# Patient Record
Sex: Female | Born: 1955
Health system: Southern US, Community
[De-identification: ages and names within clinical notes are randomized; demographics above are authoritative.]

## PROBLEM LIST (undated history)

## (undated) DIAGNOSIS — E559 Vitamin D deficiency, unspecified: Secondary | ICD-10-CM

## (undated) DIAGNOSIS — M75112 Incomplete rotator cuff tear or rupture of left shoulder, not specified as traumatic: Secondary | ICD-10-CM

## (undated) DIAGNOSIS — K449 Diaphragmatic hernia without obstruction or gangrene: Secondary | ICD-10-CM

## (undated) DIAGNOSIS — T7840XA Allergy, unspecified, initial encounter: Secondary | ICD-10-CM

## (undated) DIAGNOSIS — R030 Elevated blood-pressure reading, without diagnosis of hypertension: Secondary | ICD-10-CM

## (undated) DIAGNOSIS — G629 Polyneuropathy, unspecified: Principal | ICD-10-CM

## (undated) DIAGNOSIS — Z973 Presence of spectacles and contact lenses: Secondary | ICD-10-CM

## (undated) DIAGNOSIS — Z972 Presence of dental prosthetic device (complete) (partial): Secondary | ICD-10-CM

## (undated) DIAGNOSIS — H409 Unspecified glaucoma: Secondary | ICD-10-CM

## (undated) DIAGNOSIS — L932 Other local lupus erythematosus: Secondary | ICD-10-CM

## (undated) DIAGNOSIS — M6530 Trigger finger, unspecified finger: Secondary | ICD-10-CM

## (undated) DIAGNOSIS — K08109 Complete loss of teeth, unspecified cause, unspecified class: Secondary | ICD-10-CM

## (undated) DIAGNOSIS — J309 Allergic rhinitis, unspecified: Secondary | ICD-10-CM

## (undated) DIAGNOSIS — K219 Gastro-esophageal reflux disease without esophagitis: Secondary | ICD-10-CM

## (undated) DIAGNOSIS — R232 Flushing: Secondary | ICD-10-CM

## (undated) DIAGNOSIS — E785 Hyperlipidemia, unspecified: Secondary | ICD-10-CM

## (undated) DIAGNOSIS — E7800 Pure hypercholesterolemia, unspecified: Secondary | ICD-10-CM

## (undated) DIAGNOSIS — I10 Essential (primary) hypertension: Secondary | ICD-10-CM

## (undated) HISTORY — PX: UPPER GASTROINTESTINAL ENDOSCOPY: SHX188

## (undated) HISTORY — PX: UPPER GI ENDOSCOPY: SHX6162

## (undated) HISTORY — DX: Other local lupus erythematosus: L93.2

## (undated) HISTORY — PX: COLONOSCOPY: SHX174

## (undated) HISTORY — DX: Pure hypercholesterolemia, unspecified: E78.00

## (undated) HISTORY — DX: Polyneuropathy, unspecified: G62.9

## (undated) HISTORY — DX: Vitamin D deficiency, unspecified: E55.9

## (undated) HISTORY — DX: Gastro-esophageal reflux disease without esophagitis: K21.9

## (undated) HISTORY — DX: Allergic rhinitis, unspecified: J30.9

## (undated) HISTORY — DX: Allergy, unspecified, initial encounter: T78.40XA

## (undated) HISTORY — DX: Flushing: R23.2

## (undated) HISTORY — DX: Elevated blood-pressure reading, without diagnosis of hypertension: R03.0

---

## 2000-10-26 ENCOUNTER — Emergency Department (HOSPITAL_COMMUNITY): Admission: EM | Admit: 2000-10-26 | Discharge: 2000-10-27 | Payer: Self-pay | Admitting: Emergency Medicine

## 2000-12-19 HISTORY — PX: FOOT SURGERY: SHX648

## 2001-12-09 ENCOUNTER — Emergency Department (HOSPITAL_COMMUNITY): Admission: EM | Admit: 2001-12-09 | Discharge: 2001-12-09 | Payer: Self-pay | Admitting: Emergency Medicine

## 2001-12-09 ENCOUNTER — Encounter: Payer: Self-pay | Admitting: Emergency Medicine

## 2001-12-15 ENCOUNTER — Encounter: Payer: Self-pay | Admitting: Orthopedic Surgery

## 2001-12-15 ENCOUNTER — Observation Stay (HOSPITAL_COMMUNITY): Admission: RE | Admit: 2001-12-15 | Discharge: 2001-12-16 | Payer: Self-pay | Admitting: Orthopedic Surgery

## 2003-09-22 ENCOUNTER — Encounter: Payer: Self-pay | Admitting: Emergency Medicine

## 2003-09-22 ENCOUNTER — Emergency Department (HOSPITAL_COMMUNITY): Admission: EM | Admit: 2003-09-22 | Discharge: 2003-09-22 | Payer: Self-pay | Admitting: Emergency Medicine

## 2005-03-22 ENCOUNTER — Other Ambulatory Visit: Admission: RE | Admit: 2005-03-22 | Discharge: 2005-03-22 | Payer: Self-pay | Admitting: Family Medicine

## 2005-06-07 ENCOUNTER — Ambulatory Visit: Payer: Self-pay | Admitting: Gastroenterology

## 2005-06-14 ENCOUNTER — Ambulatory Visit: Payer: Self-pay | Admitting: Gastroenterology

## 2005-06-29 ENCOUNTER — Ambulatory Visit: Payer: Self-pay | Admitting: Gastroenterology

## 2005-06-29 DIAGNOSIS — K219 Gastro-esophageal reflux disease without esophagitis: Secondary | ICD-10-CM

## 2005-06-29 HISTORY — DX: Gastro-esophageal reflux disease without esophagitis: K21.9

## 2005-09-15 ENCOUNTER — Ambulatory Visit (HOSPITAL_COMMUNITY): Admission: RE | Admit: 2005-09-15 | Discharge: 2005-09-15 | Payer: Self-pay | Admitting: Gastroenterology

## 2005-09-15 ENCOUNTER — Ambulatory Visit: Payer: Self-pay | Admitting: Gastroenterology

## 2006-03-27 ENCOUNTER — Other Ambulatory Visit: Admission: RE | Admit: 2006-03-27 | Discharge: 2006-03-27 | Payer: Self-pay | Admitting: Family Medicine

## 2006-03-30 ENCOUNTER — Ambulatory Visit: Payer: Self-pay | Admitting: Gastroenterology

## 2006-04-21 ENCOUNTER — Ambulatory Visit: Payer: Self-pay | Admitting: Gastroenterology

## 2007-04-03 ENCOUNTER — Other Ambulatory Visit: Admission: RE | Admit: 2007-04-03 | Discharge: 2007-04-03 | Payer: Self-pay | Admitting: Family Medicine

## 2007-12-07 ENCOUNTER — Encounter: Admission: RE | Admit: 2007-12-07 | Discharge: 2007-12-18 | Payer: Self-pay | Admitting: Family Medicine

## 2007-12-26 ENCOUNTER — Encounter: Admission: RE | Admit: 2007-12-26 | Discharge: 2008-01-04 | Payer: Self-pay | Admitting: Family Medicine

## 2008-04-24 ENCOUNTER — Other Ambulatory Visit: Admission: RE | Admit: 2008-04-24 | Discharge: 2008-04-24 | Payer: Self-pay | Admitting: Family Medicine

## 2009-04-30 ENCOUNTER — Other Ambulatory Visit: Admission: RE | Admit: 2009-04-30 | Discharge: 2009-04-30 | Payer: Self-pay | Admitting: Family Medicine

## 2010-05-13 ENCOUNTER — Other Ambulatory Visit: Admission: RE | Admit: 2010-05-13 | Discharge: 2010-05-13 | Payer: Self-pay | Admitting: Family Medicine

## 2010-12-15 ENCOUNTER — Encounter
Admission: RE | Admit: 2010-12-15 | Discharge: 2010-12-15 | Payer: Self-pay | Source: Home / Self Care | Attending: Family Medicine | Admitting: Family Medicine

## 2011-05-06 NOTE — Op Note (Signed)
. [facility]  Patient:    Julia Kim, Julia Kim Visit Number: [id number] MRN: [id number]          Service Type: SUR Location: 5000 [id number] Attending Physician:  Nadara Mustard Dictated by:   Nadara Mustard, M.D. Proc. Date: 12/15/01 Admit Date:  12/15/2001                             Operative Report  PREOPERATIVE DIAGNOSIS:  Standard displaced two part right calcaneus fracture with varus angulation and shortening.  POSTOPERATIVE DIAGNOSIS:  Standard displaced two part right calcaneus fracture with varus angulation and shortening.  PROCEDURE:  Open reduction and internal fixation, right calcaneus with the Synthes locking calcaneal plate.  SURGEON:  Nadara Mustard, M.D.  ANESTHESIA:  General endotracheal.  ESTIMATED BLOOD LOSS:  Minimal.  ANTIBIOTICS:  One gram of Kefzol.  TOURNIQUET TIME:  Eighty minutes.  DISPOSITION:  To PACU in stable condition.  INDICATIONS FOR PROCEDURE:  The patient is a 55 year old woman with a displaced posterior facet calcaneus fracture.  The risks and benefits were discussed including wound breakdown, infection, arthritis, need for additional surgery, need for a subtalar fusion.  The patient states she understands and wishes to proceed at this time.  DESCRIPTION OF PROCEDURE:  The patient was brought to OR #5 and underwent a general anesthetic.  After an adequate level of anesthesia was obtained, the patient was placed in the left lateral decubitus position with the right side up, and the right lower extremity was prepped using DuraPrep, and draped into the sterile field.  The leg was elevated and tourniquet inflated to 300 mmHg at the thigh.  A lateral incision was made over the vermilion border, extending proximally from just anterior to the Achilles tendon, extending along the vermilion border of the foot, extending distally and curving anteriorly over the calcaneal cuboid joint.  This was carried sharply down  to bone and subperiosteal dissection was used to lift up the entire flap.  There was no instruments used to touch the flap during the procedure.  The perineal tendons were elevated with the flap; 6-2 K-wires were used to keep the flap elevated, and irrigation was used to keep the flap moist.  The lateral facet ________ was irrigated, cleansed, and reduced.  A K-wire and a 3.5 cortical screw were used to stabilize the lateral displaced fracture.  A shin splint was inserted into the calcaneus to the os calcis to facilitate reduction.  A calcaneal plate was then contoured and stabilized with the 3.5 locking screws. The wound was irrigated.  C-arm fluoroscopy verified reduction.  The deep subcutaneous was closed using 2-0 Vicryl and the skin was closed using the Allgower stitch with no stitches crossing the flap.  The wound was cleansed. The wound was covered with Adaptic and a compressive Quincy Simmonds dressing was applied.  The tourniquet was deflated after 80 minutes.  The patient was extubated and taken to PACU in stable condition. Dictated by:   Nadara Mustard, M.D. Attending Physician:  Nadara Mustard DD:  12/15/01 TD:  12/16/01 Job: [id number]

## 2011-05-06 NOTE — H&P (Signed)
[place]. [facility]  Patient:    Julia Kim, Julia Kim Visit Number: [id number] MRN: [id number]          Service Type: SUR Location: 5000 5032 01 Attending Physician:  Nadara Mustard Dictated by:   Nadara Mustard, M.D. Admit Date:  12/15/2001                           History and Physical  HISTORY OF PRESENT ILLNESS:  The patient is a 55 year old woman who has sustained a right calcaneus fracture.  She was treated appropriately conservative with a compressive dressing.  She underwent a CT scan which showed a displaced posterior facet calcaneus fracture.  The risks of surgical, versus nonsurgical intervention were discussed with pain being persistent in both operative and nonoperative cases.  The displaced posterior facet fracture with increased risk of arthritis, increased risk of avascular necrosis of the fractured fragments, risk of skin wound breakdown should she proceed with surgical intervention, the widening of the calcaneus with nonsurgical treatment, and the varus angulation.  After a long discussion, the patient states that she wishes to proceed with a surgical intervention and presents at this time for an open reduction, internal fixation of her right calcaneus.  ALLERGIES:  No known drug allergies.  CURRENT MEDICATIONS:  Allegra D for allergies.  PAST MEDICAL HISTORY 1. A C-section 25 years ago. 2. Left foot surgery x 2, including a Mortons neuroma resection at    one time.  SOCIAL HISTORY:  Negative for tobacco.  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  FAMILY HISTORY:  Positive for coronary artery disease and hypertension.  REVIEW OF SYSTEMS:  Positive for eye glasses, negative for sickle cell.  PHYSICAL EXAMINATION  VITAL SIGNS:  Temperature 97.2 degrees, pulse 76, respirations 12, blood pressure 120/84.  Height 5 feet 6 inches, weight 133 pounds.  GENERAL:  She is in no acute distress.  NECK:  Supple, no bruits.  LUNGS:   Clear to auscultation.  CARDIOVASCULAR:  A regular rate and rhythm.  EXTREMITIES:  Exam of her right lower extremity:  She has a good wrinkling of skin, no swelling.  She has good dorsalis pedis pulses.  The skin is intact. No blistering.  A CT scan shows that a displaced right ________ two-part posterior facet calcaneus fracture with a varus angulation and shortening.  ASSESSMENT:  Displaced posterior facet calcaneus fracture with a shortening and varus angulation.  PLAN:  The patient wishes to proceed with an open reduction and internal fixation at this time.  We will schedule this at her earliest convenience. Dictated by:   Nadara Mustard, M.D. Attending Physician:  Nadara Mustard DD:  12/15/01 TD:  12/15/01 Job: [id number]

## 2011-05-06 NOTE — Op Note (Signed)
Julia Kim, Julia Kim              ACCOUNT NO.:  [id number]   MEDICAL RECORD NO.:  [id number]          PATIENT TYPE:  AMB   LOCATION:  ENDO                         FACILITY:  [facility]   PHYSICIAN:  Vania Rea. Jarold Motto, M.D. [facility] OF BIRTH:  April 02, 1956   DATE OF PROCEDURE:  09/05/2005  DATE OF DISCHARGE:  09/15/2005                                 OPERATIVE REPORT   An esophageal manometry was completed on September 15, 2005.  Results are as  follows:   1.  Upper esophageal sphincter - There was normal coordination between      pharyngeal contraction and cricopharyngeal relaxation.  2.  Lower esophageal sphincter - Lower esophageal sphincter pressure is      normal at 20 mmHg with normal relaxation and swallowing.  3.  Motility pattern - Normally propagated peristaltic waves and normal      amplitude and duration throughout the length of esophagus to both wet      and dry swallows.   ASSESSMENT:  This is a normal esophageal manometry without any evidence of  lower esophageal sphincter incompetency and/or an esophageal motility  disorder.   PROCEDURE:  A 24-hour pH probe report.   A 24-hour pH probe test was completed also on September 16, 2005.  This exam  was entirely normal without any evidence of acid reflux in the upright,  recumbent, or post prandial periods.  Total DeMeester score is 1.9, normal  being less than 22.  There is no correlation of her symptomatology with acid  reflux symptoms.   ASSESSMENT:  This patient appears to have good acid control on Protonix  therapy.  As mentioned above, there is poor correlation of her symptoms with  acid reflux episodes.  Will see her in the office for follow-up as  scheduled.           ______________________________  Vania Rea. Jarold Motto, M.D. [facility]     DRP/MEDQ  D:  09/23/2005  T:  09/23/2005  Job:  [id number]

## 2011-10-21 ENCOUNTER — Encounter: Payer: Self-pay | Admitting: Gastroenterology

## 2011-11-15 ENCOUNTER — Other Ambulatory Visit (INDEPENDENT_AMBULATORY_CARE_PROVIDER_SITE_OTHER): Payer: 59

## 2011-11-15 ENCOUNTER — Encounter: Payer: Self-pay | Admitting: Gastroenterology

## 2011-11-15 ENCOUNTER — Ambulatory Visit (INDEPENDENT_AMBULATORY_CARE_PROVIDER_SITE_OTHER): Payer: 59 | Admitting: Gastroenterology

## 2011-11-15 DIAGNOSIS — K824 Cholesterolosis of gallbladder: Secondary | ICD-10-CM | POA: Insufficient documentation

## 2011-11-15 DIAGNOSIS — R109 Unspecified abdominal pain: Secondary | ICD-10-CM

## 2011-11-15 DIAGNOSIS — L932 Other local lupus erythematosus: Secondary | ICD-10-CM

## 2011-11-15 DIAGNOSIS — L930 Discoid lupus erythematosus: Secondary | ICD-10-CM

## 2011-11-15 LAB — URINALYSIS
Bilirubin Urine: NEGATIVE
Ketones, ur: NEGATIVE
Leukocytes, UA: NEGATIVE
Nitrite: NEGATIVE
Specific Gravity, Urine: 1.02 (ref 1.000–1.030)
Total Protein, Urine: NEGATIVE
Urine Glucose: NEGATIVE
Urobilinogen, UA: 0.2 (ref 0.0–1.0)
pH: 6 (ref 5.0–8.0)

## 2011-11-15 MED ORDER — PEG-KCL-NACL-NASULF-NA ASC-C 100 G PO SOLR: 1.0000 | Freq: Once | ORAL | Status: DC

## 2011-11-15 NOTE — Patient Instructions (Signed)
Your procedure has been scheduled for 11/28/2011, please follow the seperate instructions.  Your prescription(s) have been sent to you pharmacy.  Please go to the basement today for your labs.

## 2011-11-15 NOTE — Progress Notes (Signed)
History of Present Illness:  This is a pleasant 55 year old African American female who has had recurrent right lower quadrant-right hip pain for over one year. Her pain is described as a dull aching sensation, 3/10, occurs daily in the right lower quadrant with movement into her right hip and down her right leg with associated numbness. There is definitely positional changes associated with her pain, and it seems to be worse in the morning, and worse lying on the right side. She had previous MRI of her back in 2008 which was unremarkable septum some prominent epidural fat in the lower lumbar canal. At that time she underwent physical therapy. An upper abdominal ultrasound exam was reviewed and is normal except for some gallbladder polyps. The patient denies any hepatobiliary or upper gastrointestinal issues. He has regular bowel movements without melena or hematochezia. She was seen previously in 2006 with musculoskeletal upper GI pain. Endoscopy at that time was unremarkable. Colonoscopy was normal in May of 2007. She denies anorexia, weight loss, specific food intolerances, or new systemic complaints. The patient also denies any genitourinary or gynecologic problems. She has had an excellent workup by Dr. Cecille Amsterdam which has included negative Hemoccult cards, normal CBC and metabolic profiles, liver function tests within normal. 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  I have reviewed this patient's present history, medical and surgical past history, allergies and medications.     ROS: The remainder of the 10 point ROS is negative     Physical Exam: General well developed well nourished patient in no acute distress, appearing her stated age Eyes PERRLA, no icterus, fundoscopic exam per opthamologist Skin no lesions noted Neck supple, no adenopathy, no thyroid enlargement, no tenderness Chest clear to percussion and  auscultation Heart no significant murmurs, gallops or rubs noted Abdomen no hepatosplenomegaly masses or tenderness, BS normal.  Extremities no acute joint lesions, edema, phlebitis or evidence of cellulitis. There is a negative psoas and obturator sign in the right leg. I cannot appreciate any inguinal masses or tenderness. Neurologic patient oriented x 3, cranial nerves intact, no focal neurologic deficits noted. Psychological mental status normal and normal affect.  Assessment and plan: Unusual right lower quadrant pain and a 55 year old patient without any specific GI complaints. Her pain seems most consistent with musculoskeletal or radicular pain from her lumbosacral area. We will do colonoscopy to complete her workup, but I suspect she will need referral to neurosurgery or orthopedics per her back. Her gallbladder polyps do not seem to be symptomatic. I've asked her to continue other medications as listed and reviewed in her record.  Encounter Diagnoses  Name Primary?  . Abdominal pain Yes  . Polyp of gallbladder

## 2011-11-28 ENCOUNTER — Ambulatory Visit (AMBULATORY_SURGERY_CENTER): Payer: 59 | Admitting: Gastroenterology

## 2011-11-28 ENCOUNTER — Encounter: Payer: Self-pay | Admitting: Gastroenterology

## 2011-11-28 VITALS — BP 156/94 | HR 65 | Temp 97.5°F | Resp 20 | Ht 67.0 in | Wt 145.0 lb

## 2011-11-28 DIAGNOSIS — K5730 Diverticulosis of large intestine without perforation or abscess without bleeding: Secondary | ICD-10-CM | POA: Insufficient documentation

## 2011-11-28 DIAGNOSIS — R109 Unspecified abdominal pain: Secondary | ICD-10-CM

## 2011-11-28 DIAGNOSIS — Z1211 Encounter for screening for malignant neoplasm of colon: Secondary | ICD-10-CM

## 2011-11-28 MED ORDER — SODIUM CHLORIDE 0.9 % IV SOLN: 500.0000 mL | INTRAVENOUS | Status: DC

## 2011-11-28 NOTE — Op Note (Signed)
[facility] [address] [place], [place]  [address]  COLONOSCOPY PROCEDURE REPORT  PATIENT:  Julia Kim, Julia Kim  MR#:  [id number] BIRTHDATE:  [birth year], 55 yrs. old  GENDER:  female ENDOSCOPIST:  Vania Rea. Jarold Motto, MD, [facility] REF. BY: PROCEDURE DATE:  11/28/2011 PROCEDURE:  Average-risk screening colonoscopy G0121 ASA CLASS:  Class II INDICATIONS:  Routine Risk Screening MEDICATIONS:   Fentanyl 50 mcg IV, Versed, These medications were titrated to patient response per physician's verbal order  DESCRIPTION OF PROCEDURE:   After the risks and benefits and of the procedure were explained, informed consent was obtained. Digital rectal exam was performed and revealed no abnormalities. The LB 180AL [id number] endoscope was introduced through the anus and advanced to the cecum, which was identified by both the appendix and ileocecal valve.  The quality of the prep was excellent, using MoviPrep.  The instrument was then slowly withdrawn as the colon was fully examined. <<PROCEDUREIMAGES>>  FINDINGS:  Scattered diverticula were found.  No polyps or cancers were seen.   Retroflexed views in the rectum revealed no abnormalities.    The scope was then withdrawn from the patient and the procedure completed.  COMPLICATIONS:  None ENDOSCOPIC IMPRESSION: 1) Diverticula, scattered 2) No polyps or cancers 3) Normal colonoscopy otherwise RECOMMENDATIONS: 1) High fiber diet with liberal fluid intake. 2) Repeat Colonoscopy in 5 years.  REPEAT EXAM:  No  ______________________________ Vania Rea. Jarold Motto, MD, [facility]  CC:  Tally Joe, MD  n. Rosalie Doctor:   Vania Rea. Dunia Pringle at 11/28/2011 02:42 PM  Derl Barrow, [id number]

## 2011-11-28 NOTE — Progress Notes (Signed)
Patient did not experience any of the following events: a burn prior to discharge; a fall within the facility; wrong site/side/patient/procedure/implant event; or a hospital transfer or hospital admission upon discharge from the facility. (G8907) Patient did not have preoperative order for IV antibiotic SSI prophylaxis. (G8918)  

## 2011-11-28 NOTE — Progress Notes (Signed)
The pt tolerated the colonoscopy very well. maw 

## 2011-11-28 NOTE — Patient Instructions (Signed)
Discharge instructions given with verbal understanding. Handouts on diverticulosis and a high fiber diet given. Resume previous medications. 

## 2012-10-30 ENCOUNTER — Other Ambulatory Visit (HOSPITAL_COMMUNITY): Payer: Self-pay | Admitting: Family Medicine

## 2012-10-30 DIAGNOSIS — Z1231 Encounter for screening mammogram for malignant neoplasm of breast: Secondary | ICD-10-CM

## 2012-11-27 ENCOUNTER — Ambulatory Visit (HOSPITAL_COMMUNITY)
Admission: RE | Admit: 2012-11-27 | Discharge: 2012-11-27 | Disposition: A | Discharge status: Home / Self Care | Payer: 59 | Source: Ambulatory Visit | Attending: Family Medicine | Admitting: Family Medicine

## 2012-11-27 DIAGNOSIS — Z1231 Encounter for screening mammogram for malignant neoplasm of breast: Secondary | ICD-10-CM | POA: Insufficient documentation

## 2012-12-18 ENCOUNTER — Other Ambulatory Visit: Payer: Self-pay | Admitting: Family Medicine

## 2012-12-18 DIAGNOSIS — R928 Other abnormal and inconclusive findings on diagnostic imaging of breast: Secondary | ICD-10-CM

## 2012-12-26 ENCOUNTER — Ambulatory Visit
Admission: RE | Admit: 2012-12-26 | Discharge: 2012-12-26 | Disposition: A | Discharge status: Home / Self Care | Payer: 59 | Source: Ambulatory Visit | Attending: Family Medicine | Admitting: Family Medicine

## 2012-12-26 DIAGNOSIS — R928 Other abnormal and inconclusive findings on diagnostic imaging of breast: Secondary | ICD-10-CM

## 2013-09-16 ENCOUNTER — Other Ambulatory Visit: Payer: Self-pay | Admitting: Orthopedic Surgery

## 2013-09-16 ENCOUNTER — Encounter (HOSPITAL_BASED_OUTPATIENT_CLINIC_OR_DEPARTMENT_OTHER): Payer: Self-pay | Admitting: *Deleted

## 2013-09-16 NOTE — Progress Notes (Signed)
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

## 2013-09-20 ENCOUNTER — Encounter (HOSPITAL_BASED_OUTPATIENT_CLINIC_OR_DEPARTMENT_OTHER): Payer: Self-pay | Admitting: Anesthesiology

## 2013-09-20 ENCOUNTER — Ambulatory Visit (HOSPITAL_BASED_OUTPATIENT_CLINIC_OR_DEPARTMENT_OTHER): Payer: 59 | Admitting: Anesthesiology

## 2013-09-20 ENCOUNTER — Ambulatory Visit (HOSPITAL_BASED_OUTPATIENT_CLINIC_OR_DEPARTMENT_OTHER)
Admission: RE | Admit: 2013-09-20 | Discharge: 2013-09-20 | Disposition: A | Discharge status: Home / Self Care | Payer: 59 | Source: Ambulatory Visit | Attending: Orthopedic Surgery | Admitting: Orthopedic Surgery

## 2013-09-20 ENCOUNTER — Encounter (HOSPITAL_BASED_OUTPATIENT_CLINIC_OR_DEPARTMENT_OTHER): Payer: Self-pay | Admitting: Orthopedic Surgery

## 2013-09-20 ENCOUNTER — Encounter (HOSPITAL_BASED_OUTPATIENT_CLINIC_OR_DEPARTMENT_OTHER)
Admission: RE | Disposition: A | Discharge status: Home / Self Care | Payer: Self-pay | Source: Ambulatory Visit | Attending: Orthopedic Surgery

## 2013-09-20 DIAGNOSIS — G5620 Lesion of ulnar nerve, unspecified upper limb: Secondary | ICD-10-CM | POA: Insufficient documentation

## 2013-09-20 HISTORY — PX: ULNAR NERVE TRANSPOSITION: SHX2595

## 2013-09-20 HISTORY — DX: Presence of dental prosthetic device (complete) (partial): K08.109

## 2013-09-20 HISTORY — DX: Presence of dental prosthetic device (complete) (partial): Z97.2

## 2013-09-20 HISTORY — DX: Presence of spectacles and contact lenses: Z97.3

## 2013-09-20 LAB — POCT HEMOGLOBIN-HEMACUE: Hemoglobin: 13.9 g/dL (ref 12.0–15.0)

## 2013-09-20 SURGERY — ULNAR NERVE DECOMPRESSION/TRANSPOSITION
Anesthesia: General | Site: Elbow | Laterality: Right | Wound class: Clean

## 2013-09-20 MED ORDER — CHLORHEXIDINE GLUCONATE 4 % EX LIQD: 60.0000 mL | Freq: Once | CUTANEOUS | Status: DC

## 2013-09-20 MED ORDER — MIDAZOLAM HCL 5 MG/5ML IJ SOLN
INTRAMUSCULAR | Status: DC | PRN
  Administered 2013-09-20: 2 mg via INTRAVENOUS

## 2013-09-20 MED ORDER — LIDOCAINE HCL (CARDIAC) 20 MG/ML IV SOLN
INTRAVENOUS | Status: DC | PRN
  Administered 2013-09-20: 50 mg via INTRAVENOUS

## 2013-09-20 MED ORDER — BUPIVACAINE HCL (PF) 0.25 % IJ SOLN
INTRAMUSCULAR | Status: DC | PRN
  Administered 2013-09-20: 8 mL

## 2013-09-20 MED ORDER — CEFAZOLIN SODIUM-DEXTROSE 2-3 GM-% IV SOLR: 2.0000 g | INTRAVENOUS | Status: DC

## 2013-09-20 MED ORDER — HYDROCODONE-ACETAMINOPHEN 5-325 MG PO TABS: 1.0000 | ORAL_TABLET | Freq: Four times a day (QID) | ORAL | Status: DC | PRN

## 2013-09-20 MED ORDER — ONDANSETRON HCL 4 MG/2ML IJ SOLN
INTRAMUSCULAR | Status: DC | PRN
  Administered 2013-09-20: 4 mg via INTRAVENOUS

## 2013-09-20 MED ORDER — DEXAMETHASONE SODIUM PHOSPHATE 4 MG/ML IJ SOLN
INTRAMUSCULAR | Status: DC | PRN
  Administered 2013-09-20: 8 mg via INTRAVENOUS

## 2013-09-20 MED ORDER — FENTANYL CITRATE 0.05 MG/ML IJ SOLN
INTRAMUSCULAR | Status: DC | PRN
  Administered 2013-09-20: 50 ug via INTRAVENOUS

## 2013-09-20 MED ORDER — PROPOFOL 10 MG/ML IV BOLUS
INTRAVENOUS | Status: DC | PRN
  Administered 2013-09-20: 150 mg via INTRAVENOUS

## 2013-09-20 MED ORDER — OXYCODONE HCL 5 MG PO TABS: 5.0000 mg | ORAL_TABLET | Freq: Once | ORAL | Status: DC | PRN

## 2013-09-20 MED ORDER — OXYCODONE HCL 5 MG/5ML PO SOLN: 5.0000 mg | Freq: Once | ORAL | Status: DC | PRN

## 2013-09-20 MED ORDER — ONDANSETRON HCL 4 MG/2ML IJ SOLN: 4.0000 mg | Freq: Once | INTRAMUSCULAR | Status: DC | PRN

## 2013-09-20 MED ORDER — LACTATED RINGERS IV SOLN
INTRAVENOUS | Status: DC
  Administered 2013-09-20 (×3): via INTRAVENOUS
  Administered 2013-09-20: 10 mL/h via INTRAVENOUS

## 2013-09-20 MED ORDER — HYDROMORPHONE HCL PF 1 MG/ML IJ SOLN
0.2500 mg | INTRAMUSCULAR | Status: DC | PRN
Start: 2013-09-20 — End: 2013-09-20
  Administered 2013-09-20: 0.5 mg via INTRAVENOUS

## 2013-09-20 SURGICAL SUPPLY — 52 items
BANDAGE GAUZE ELAST BULKY 4 IN (GAUZE/BANDAGES/DRESSINGS) ×2 IMPLANT
BLADE MINI RND TIP GREEN BEAV (BLADE) ×2 IMPLANT
BLADE SURG 15 STRL LF DISP TIS (BLADE) ×1 IMPLANT
BLADE SURG 15 STRL SS (BLADE) ×1
BNDG COHESIVE 3X5 TAN STRL LF (GAUZE/BANDAGES/DRESSINGS) ×4 IMPLANT
BNDG ESMARK 4X9 LF (GAUZE/BANDAGES/DRESSINGS) IMPLANT
CHLORAPREP W/TINT 26ML (MISCELLANEOUS) ×2 IMPLANT
CLOTH BEACON ORANGE TIMEOUT ST (SAFETY) ×2 IMPLANT
CORDS BIPOLAR (ELECTRODE) ×2 IMPLANT
COVER MAYO STAND STRL (DRAPES) ×2 IMPLANT
COVER TABLE BACK 60X90 (DRAPES) ×2 IMPLANT
CUFF TOURNIQUET SINGLE 18IN (TOURNIQUET CUFF) ×2 IMPLANT
DECANTER SPIKE VIAL GLASS SM (MISCELLANEOUS) IMPLANT
DRAPE EXTREMITY T 121X128X90 (DRAPE) ×2 IMPLANT
DRAPE SURG 17X23 STRL (DRAPES) ×2 IMPLANT
DRSG KUZMA FLUFF (GAUZE/BANDAGES/DRESSINGS) IMPLANT
DRSG PAD ABDOMINAL 8X10 ST (GAUZE/BANDAGES/DRESSINGS) ×2 IMPLANT
GAUZE SPONGE 4X4 16PLY XRAY LF (GAUZE/BANDAGES/DRESSINGS) IMPLANT
GAUZE XEROFORM 1X8 LF (GAUZE/BANDAGES/DRESSINGS) ×2 IMPLANT
GLOVE BIO SURGEON STRL SZ 6.5 (GLOVE) IMPLANT
GLOVE BIOGEL PI IND STRL 8.5 (GLOVE) ×1 IMPLANT
GLOVE BIOGEL PI INDICATOR 8.5 (GLOVE) ×1
GLOVE EXAM NITRILE LRG STRL (GLOVE) ×2 IMPLANT
GLOVE SURG ORTHO 8.0 STRL STRW (GLOVE) ×2 IMPLANT
GLOVE SURG SS PI 7.0 STRL IVOR (GLOVE) ×2 IMPLANT
GOWN BRE IMP PREV XXLGXLNG (GOWN DISPOSABLE) ×2 IMPLANT
GOWN PREVENTION PLUS XLARGE (GOWN DISPOSABLE) ×2 IMPLANT
LOOP VESSEL MAXI BLUE (MISCELLANEOUS) IMPLANT
NDL SUT 6 .5 CRC .975X.05 MAYO (NEEDLE) IMPLANT
NEEDLE 27GAX1X1/2 (NEEDLE) ×2 IMPLANT
NEEDLE MAYO TAPER (NEEDLE)
NS IRRIG 1000ML POUR BTL (IV SOLUTION) ×2 IMPLANT
PACK BASIN DAY SURGERY FS (CUSTOM PROCEDURE TRAY) ×2 IMPLANT
PAD CAST 3X4 CTTN HI CHSV (CAST SUPPLIES) ×1 IMPLANT
PAD CAST 4YDX4 CTTN HI CHSV (CAST SUPPLIES) ×1 IMPLANT
PADDING CAST ABS 4INX4YD NS (CAST SUPPLIES) ×1
PADDING CAST ABS COTTON 4X4 ST (CAST SUPPLIES) ×1 IMPLANT
PADDING CAST COTTON 3X4 STRL (CAST SUPPLIES) ×1
PADDING CAST COTTON 4X4 STRL (CAST SUPPLIES) ×1
SLEEVE SCD COMPRESS KNEE MED (MISCELLANEOUS) ×2 IMPLANT
SPLINT PLASTER CAST XFAST 3X15 (CAST SUPPLIES) IMPLANT
SPLINT PLASTER XTRA FASTSET 3X (CAST SUPPLIES)
SPONGE GAUZE 4X4 12PLY (GAUZE/BANDAGES/DRESSINGS) ×2 IMPLANT
STOCKINETTE 4X48 STRL (DRAPES) ×2 IMPLANT
SUT VIC AB 2-0 SH 27 (SUTURE) ×1
SUT VIC AB 2-0 SH 27XBRD (SUTURE) ×1 IMPLANT
SUT VICRYL 4-0 PS2 18IN ABS (SUTURE) IMPLANT
SUT VICRYL RAPIDE 4/0 PS 2 (SUTURE) ×2 IMPLANT
SYR BULB 3OZ (MISCELLANEOUS) ×2 IMPLANT
SYR CONTROL 10ML LL (SYRINGE) ×2 IMPLANT
TOWEL OR 17X24 6PK STRL BLUE (TOWEL DISPOSABLE) ×2 IMPLANT
UNDERPAD 30X30 INCONTINENT (UNDERPADS AND DIAPERS) ×2 IMPLANT

## 2013-09-20 NOTE — Brief Op Note (Signed)
09/20/2013  2:35 PM  PATIENT:  Julia Kim  57 y.o. female  PRE-OPERATIVE DIAGNOSIS:  CUBITAL TUNNEL RIGHT  POST-OPERATIVE DIAGNOSIS:  CUBITAL TUNNEL RIGHT  PROCEDURE:  Procedure(s): RIGHT ULNAR NERVE DECOMPRESSION (Right)  SURGEON:  Surgeon(s) and Role:    * Nicki Reaper, MD - Primary  PHYSICIAN ASSISTANT:   ASSISTANTS: none   ANESTHESIA:   local and general  EBL:  Total I/O In: 500 [I.V.:500] Out: -   BLOOD ADMINISTERED:none  DRAINS: none   LOCAL MEDICATIONS USED:  MARCAINE     SPECIMEN:  No Specimen  DISPOSITION OF SPECIMEN:  N/A  COUNTS:  YES  TOURNIQUET:   Total Tourniquet Time Documented: Upper Arm (Right) - 18 minutes Total: Upper Arm (Right) - 18 minutes   DICTATION: .Other Dictation: Dictation Number [id number]  PLAN OF CARE: Discharge to home after PACU  PATIENT DISPOSITION:  PACU - hemodynamically stable.

## 2013-09-20 NOTE — Op Note (Signed)
Dictation Number [id number]

## 2013-09-20 NOTE — Transfer of Care (Signed)
Immediate Anesthesia Transfer of Care Note  Patient: Julia Kim  Procedure(s) Performed: Procedure(s): RIGHT ULNAR NERVE DECOMPRESSION (Right)  Patient Location: PACU  Anesthesia Type:General  Level of Consciousness: awake  Airway & Oxygen Therapy: Patient Spontanous Breathing and Patient connected to face mask oxygen  Post-op Assessment: Report given to PACU RN and Post -op Vital signs reviewed and stable  Post vital signs: Reviewed and stable  Complications: No apparent anesthesia complications

## 2013-09-20 NOTE — Anesthesia Preprocedure Evaluation (Signed)
Anesthesia Evaluation  Patient identified by MRN, date of birth, ID band Patient awake    Reviewed: Allergy & Precautions, H&P , NPO status , Patient's Chart, lab work & pertinent test results  Airway Mallampati: II TM Distance: >3 FB Neck ROM: Full  Mouth opening: Limited Mouth Opening  Dental  (+) Teeth Intact and Dental Advisory Given   Pulmonary  breath sounds clear to auscultation        Cardiovascular Rhythm:Regular Rate:Normal     Neuro/Psych    GI/Hepatic GERD-  Medicated and Controlled,  Endo/Other    Renal/GU      Musculoskeletal   Abdominal   Peds  Hematology   Anesthesia Other Findings   Reproductive/Obstetrics                           Anesthesia Physical Anesthesia Plan  ASA: II  Anesthesia Plan: General   Post-op Pain Management:    Induction: Intravenous  Airway Management Planned: LMA  Additional Equipment:   Intra-op Plan:   Post-operative Plan: Extubation in OR  Informed Consent: I have reviewed the patients History and Physical, chart, labs and discussed the procedure including the risks, benefits and alternatives for the proposed anesthesia with the patient or authorized representative who has indicated his/her understanding and acceptance.   Dental advisory given  Plan Discussed with: CRNA, Anesthesiologist and Surgeon  Anesthesia Plan Comments:         Anesthesia Quick Evaluation

## 2013-09-20 NOTE — H&P (Signed)
Julia Kim is a 57 year old left hand dominant female with pain, numbness and tingling mostly on her right hand ring and little fingers onto the dorsal aspect. She has had problems with her wrist requiring an MRI in 2011. She was placed in a brace. This appears to have settled down. She has seen Dr. Lajoyce Corners for this. She relates no history of injury to the hand, elbow or neck. No history of diabetes, thyroid problems, arthritis or gout. She is taking Tylenol for discomfort. It was thought she had a cyst on her wrist with occasional catching. She complains of constant, moderate, aching and burning pain with numbness and tingling going up her elbow. Rest makes it better, work makes it worse. She is not complaining of her left side.  She has had her nerve conductions done revealing a cubital tunnel syndrome on the right side.   PAST MEDICAL HISTORY: She has no known drug allergies. She is on Simvastatin, Lumigan. She has had C-section, right foot surgery for a broken heel bone.  FAMILY H ISTORY: Positive heart disease and high BP.  SOCIAL HISTORY: She does not smoke or drink. She is married and works at [employer].  REVIEW OF SYSTEMS: Positive for glasses, otherwise negative for 14 points.  Julia Kim is an 57 y.o. female.   Chief Complaint: cubital tunnel rt HPI: see above  Past Medical History  Diagnosis Date  . GERD (gastroesophageal reflux disease) 06/29/2005    EGD ,( Dr. Jarold Motto)  . Allergic sinusitis   . Hypercholesterolemia   . Glaucoma   . Elevated blood pressure (not hypertension)   . Cutaneous lupus erythematosus   . Unspecified vitamin D deficiency   . Flushing   . Allergy   . Full dentures   . Wears glasses     Past Surgical History  Procedure Laterality Date  . Foot surgery  2002    Right   . Cesarean section  1975  . Colonoscopy    . Upper gi endoscopy      Family History  Problem Relation Age of Onset  . Heart disease Mother   . Hypertension Mother    . Kidney disease Mother   . Hypertension Father   . Stroke Maternal Grandmother   . Colon cancer Neg Hx    Social History:  reports that she quit smoking about 20 years ago. She has never used smokeless tobacco. She reports that she does not drink alcohol or use illicit drugs.  Allergies:  Allergies  Allergen Reactions  . Aspirin     dyspepia     No prescriptions prior to admission    No results found for this or any previous visit (from the past 48 hour(s)).  No results found.   Pertinent items are noted in HPI.  Height 5\' 6"  (1.676 m), weight 130 lb (58.968 kg).  General appearance: alert, cooperative and appears stated age Head: Normocephalic, without obvious abnormality Neck: no JVD Resp: clear to auscultation bilaterally Cardio: regular rate and rhythm, S1, S2 normal, no murmur, click, rub or gallop GI: soft, non-tender; bowel sounds normal; no masses,  no organomegaly Extremities: extremities normal, atraumatic, no cyanosis or edema Pulses: 2+ and symmetric Skin: Skin color, texture, turgor normal. No rashes or lesions Neurologic: Grossly normal Incision/Wound: na  Assessment/Plan X-rays of her hand are negative.  X-rays of her elbow reveals a small cyst in her radial head.  X-rays of her cervical spine reveals a spur at 5/6 posteriorly, otherwise negative. She  has disc space narrowing multiple levels.   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 The pre, peri and post op course are discussed along with risks and complications.  She is advised we will plan on decompression and possible transposition. She is aware there is no guarantee with surgery, possibility of infection, recurrence, injury to arteries, nerves and tendons, incomplete relief of symptoms and dystrophy.  She is advised if the nerve is transposed she will be in a splint 2-3 weeks, if not then a splint for one week for comfort. This will be done as an outpatient  under regional and/or general anesthesia at her discretion. She is scheduled for decompression possible transposition of ulnar nerve right elbow.  Julia Kim 09/20/2013, 10:32 AM

## 2013-09-20 NOTE — Anesthesia Procedure Notes (Signed)
Procedure Name: LMA Insertion Date/Time: 09/20/2013 2:03 PM Performed by: Zenia Resides D Pre-anesthesia Checklist: Patient identified, Emergency Drugs available, Suction available and Patient being monitored Patient Re-evaluated:Patient Re-evaluated prior to inductionOxygen Delivery Method: Circle System Utilized Preoxygenation: Pre-oxygenation with 100% oxygen Intubation Type: IV induction Ventilation: Mask ventilation without difficulty LMA: LMA inserted LMA Size: 4.0 Number of attempts: 1 Airway Equipment and Method: bite block Placement Confirmation: positive ETCO2 Tube secured with: Tape Dental Injury: Teeth and Oropharynx as per pre-operative assessment

## 2013-09-20 NOTE — Op Note (Signed)
Julia Kim              ACCOUNT NO.:  [id number]  MEDICAL RECORD NO.:  [id number]  LOCATION:                                 FACILITY:  PHYSICIAN:  Cindee Salt, M.D.            DATE OF BIRTH:  DATE OF PROCEDURE:  09/20/2013 DATE OF DISCHARGE:                              OPERATIVE REPORT   PREOPERATIVE DIAGNOSIS:  Cubital tunnel syndrome, right arm.  POSTOPERATIVE DIAGNOSIS:  Cubital tunnel syndrome, right arm.  OPERATION:  Decompression of ulnar nerve, right elbow.  SURGEON:  Cindee Salt, MD  ASSISTANT:  None.  ANESTHESIA:  General with local infiltration.  ANESTHESIOLOGIST:  Sheldon Silvan, MD  HISTORY:  The patient is a 57 year old female with a history of cubital tunnel syndrome.  Nerve conduction is positive, nonresponsive to conservative treatment.  She has elected to undergo surgical decompression, possible transposition.  Pre, peri, and postoperative course have been discussed along with risks and complications.  She is aware that there is no guarantee with the surgery; possibility of infection; recurrence of injury to arteries, nerves, tendons, incomplete relief of symptoms, dystrophy.  In the preoperative area, the patient is seen, the extremity marked by both patient and surgeon.  Antibiotic given.  DESCRIPTION OF PROCEDURE:  The patient was brought to the operating room, where a general anesthetic was carried out without difficulty under the direction of Dr. Ivin Booty.  She was prepped using ChloraPrep, supine position with the right arm free.  A 3-minute dry time was allowed.  Time-out taken, confirming the patient and procedure.  The limb was exsanguinated with an Esmarch bandage.  Tourniquet placed high and the arm was inflated to 250 mmHg.  A short incision was made longitudinally just posterior to the medial epicondyle of the right elbow, carried down through subcutaneous tissue.  Neurovascular structures protected and the incision was then made in the  posterior aspect of Osborne's fascia.  The ulnar nerve was easily identified.  The subcutaneous tissue was dissected from the fascia, flexor carpi ulnaris. Two knee retractors placed.  A fasciotomy of the flexor carpi ulnaris, anterior posterior muscle bellies were then performed allowing visualization of the deep fascia over the ulnar nerve.  A KMI guide for carpal tunnel release was then inserted between the nerve and the deep fascia.  Using an angled ENT scissors, this was released for approximately 6-8 cm distally.  Attention was then  performed proximally.  The fascia was released from the subcutaneous tissue proximally.  Bleeders electrocauterized with bipolar.  The ulnar nerve was identified proximally.  The [facility] guide was then placed between the nerve and the deep fascia and the fascia was released after placement of the knee retractors for approximately 6-8 cm proximally.  The nerve was examined over its entire course, found to be intact proximally and distally.  The elbow placed through full flexion.  No subluxation to the nerve was present.  The wound was copiously irrigated with saline. Osborne's fascia was then sutured to the posterior skin flap with 2-0 Vicryl sutures.  Subcutaneous tissue was closed with interrupted 2-0 Vicryl and skin with a subcuticular 4-0 Vicryl Rapide sutures.  Sterile compressive dressing  was applied with no splint.  On deflation of the tourniquet, all fingers immediately pinked.  She was taken to the recovery room for observation in satisfactory condition.  She will be discharged home to return in 1 week on Norco.          ______________________________ Cindee Salt, M.D.     GK/MEDQ  D:  09/20/2013  T:  09/20/2013  Job:  [id number]

## 2013-09-20 NOTE — Anesthesia Postprocedure Evaluation (Signed)
  Anesthesia Post-op Note  Patient: Julia Kim  Procedure(s) Performed: Procedure(s): RIGHT ULNAR NERVE DECOMPRESSION (Right)  Patient Location: PACU  Anesthesia Type:General  Level of Consciousness: awake and alert   Airway and Oxygen Therapy: Patient Spontanous Breathing  Post-op Pain: mild  Post-op Assessment: Post-op Vital signs reviewed  Post-op Vital Signs: stable  Complications: No apparent anesthesia complications

## 2013-09-23 ENCOUNTER — Encounter (HOSPITAL_BASED_OUTPATIENT_CLINIC_OR_DEPARTMENT_OTHER): Payer: Self-pay | Admitting: Orthopedic Surgery

## 2014-01-28 ENCOUNTER — Other Ambulatory Visit: Payer: Self-pay | Admitting: Family Medicine

## 2014-01-28 ENCOUNTER — Other Ambulatory Visit (HOSPITAL_COMMUNITY)
Admission: RE | Admit: 2014-01-28 | Discharge: 2014-01-28 | Disposition: A | Discharge status: Home / Self Care | Payer: 59 | Source: Ambulatory Visit | Attending: Family Medicine | Admitting: Family Medicine

## 2014-01-28 DIAGNOSIS — Z0000 Encounter for general adult medical examination without abnormal findings: Secondary | ICD-10-CM | POA: Insufficient documentation

## 2014-04-03 ENCOUNTER — Encounter: Payer: Self-pay | Admitting: Internal Medicine

## 2014-04-08 ENCOUNTER — Ambulatory Visit (INDEPENDENT_AMBULATORY_CARE_PROVIDER_SITE_OTHER): Payer: 59 | Admitting: Internal Medicine

## 2014-04-08 ENCOUNTER — Encounter: Payer: Self-pay | Admitting: Internal Medicine

## 2014-04-08 ENCOUNTER — Other Ambulatory Visit (INDEPENDENT_AMBULATORY_CARE_PROVIDER_SITE_OTHER): Payer: 59

## 2014-04-08 VITALS — BP 180/80 | HR 78 | Ht 64.0 in | Wt 128.0 lb

## 2014-04-08 DIAGNOSIS — R195 Other fecal abnormalities: Secondary | ICD-10-CM

## 2014-04-08 DIAGNOSIS — I10 Essential (primary) hypertension: Secondary | ICD-10-CM

## 2014-04-08 LAB — CBC
HCT: 41.2 % (ref 36.0–46.0)
Hemoglobin: 14.1 g/dL (ref 12.0–15.0)
MCHC: 34.2 g/dL (ref 30.0–36.0)
MCV: 88.8 fl (ref 78.0–100.0)
Platelets: 201 10*3/uL (ref 150.0–400.0)
RBC: 4.64 Mil/uL (ref 3.87–5.11)
RDW: 12.8 % (ref 11.5–14.6)
WBC: 4.4 10*3/uL — ABNORMAL LOW (ref 4.5–10.5)

## 2014-04-08 MED ORDER — MOVIPREP 100 G PO SOLR
ORAL | Status: DC
Start: 2014-04-08 — End: 2014-05-29

## 2014-04-08 NOTE — Patient Instructions (Signed)
You have been scheduled for a colonoscopy with propofol. Please follow written instructions given to you at your visit today.  Please pick up your prep kit at the pharmacy within the next 1-3 days. If you use inhalers (even only as needed), please bring them with you on the day of your procedure. Your physician has requested that you go to www.startemmi.com and enter the access code given to you at your visit today. This web site gives a general overview about your procedure. However, you should still follow specific instructions given to you by our office regarding your preparation for the procedure.  Your physician has requested that you go to the basement for the following lab work before leaving today: CBC   

## 2014-04-08 NOTE — Progress Notes (Signed)
Patient ID: Julia Kim, female   DOB: 09-May-1956, 58 y.o.   MRN: [id number] HPI: Julia Kim is a 58 yo female with a past medical history of GERD, elevated blood pressure, hypercholesterolemia who seen in consultation at the request of Dr. Moreen Fowler to evaluate heme-positive stool. She is here alone today. She was producing under Dr. Sharlett Iles who has performed previous upper endoscopy and colonoscopy. Her last colonoscopy was on 11/28/2011 which revealed scattered colonic diverticuli without polyps or cancers. Retroflexion was normal. He recommended a high-fiber diet and repeating the colonoscopy in 5 years.  She reports she recently had her general physical and had positive FOBT which was performed at home. She denies seeing blood in her stool or melena. She reports normal bowel habits without diarrhea or constipation. She denies abdominal pain. She does have a history of dyspepsia but this has not been a problem lately. She intermittently uses omeprazole for epigastric burning pain. The burning pain completely resolves when she uses omeprazole. She notes a full strength aspirin irritates her stomach. She is currently taking a baby aspirin daily. She denies other NSAIDs.  She denies a family history of colon cancer.  Past Medical History  Diagnosis Date  . GERD (gastroesophageal reflux disease) 06/29/2005    EGD ,( Dr. Sharlett Iles)  . Allergic sinusitis   . Hypercholesterolemia   . Glaucoma   . Elevated blood pressure (not hypertension)   . Cutaneous lupus erythematosus   . Unspecified vitamin D deficiency   . Flushing   . Allergy   . Full dentures   . Wears glasses     Past Surgical History  Procedure Laterality Date  . Foot surgery  2002    Right   . Cesarean section  1975  . Colonoscopy    . Upper gi endoscopy    . Ulnar nerve transposition Right 09/20/2013    Procedure: RIGHT ULNAR NERVE DECOMPRESSION;  Surgeon: Wynonia Sours, MD;  Location: [facility];  Service:  Orthopedics;  Laterality: Right;    Current Outpatient Prescriptions  Medication Sig Dispense Refill  . aspirin 81 MG tablet Take 81 mg by mouth daily.      Marland Kitchen estradiol-levonorgestrel (CLIMARAPRO) 0.045-0.015 MG/DAY Place 1 patch onto the skin once a week.        Marland Kitchen omeprazole (PRILOSEC OTC) 20 MG tablet Take 20 mg by mouth daily as needed.        . simvastatin (ZOCOR) 40 MG tablet Take 40 mg by mouth at bedtime.        . Vitamin D, Ergocalciferol, (DRISDOL) 50000 UNITS CAPS Take 50,000 Units by mouth every 7 (seven) days.        Marland Kitchen MOVIPREP 100 G SOLR Use per prep instruction  1 kit  0   No current facility-administered medications for this visit.    Allergies  Allergen Reactions  . Aspirin     dyspepia     Family History  Problem Relation Age of Onset  . Heart disease Mother   . Hypertension Mother   . Kidney disease Mother   . Hypertension Father   . Stroke Maternal Grandmother   . Colon cancer Neg Hx     History  Substance Use Topics  . Smoking status: Former Smoker    Quit date: 12/19/1992  . Smokeless tobacco: Never Used  . Alcohol Use: No    ROS: As per history of present illness, otherwise negative  BP 180/80  Pulse 78  Ht _0  (1.626  m)  Wt 128 lb (58.06 kg)  BMI 21.96 kg/m2 Constitutional: Well-developed and well-nourished. No distress. HEENT: Normocephalic and atraumatic. Oropharynx is clear and moist. No oropharyngeal exudate. Conjunctivae are normal.  No scleral icterus. Neck: Neck supple. Trachea midline. Cardiovascular: Normal rate, regular rhythm and intact distal pulses. No M/R/G Pulmonary/chest: Effort normal and breath sounds normal. No wheezing, rales or rhonchi. Abdominal: Soft, nontender, nondistended. Bowel sounds active throughout. There are no masses palpable. No hepatosplenomegaly. Extremities: no clubbing, cyanosis, or edema Lymphadenopathy: No cervical adenopathy noted. Neurological: Alert and oriented to person place and time. Skin:  Skin is warm and dry. No rashes noted. Psychiatric: Normal mood and affect. Behavior is normal.  RELEVANT LABS: CBC    Component Value Date/Time   WBC 4.4* 04/08/2014 1612   RBC 4.64 04/08/2014 1612   HGB 14.1 04/08/2014 1612   HCT 41.2 04/08/2014 1612   PLT 201.0 04/08/2014 1612   MCV 88.8 04/08/2014 1612   MCHC 34.2 04/08/2014 1612   RDW 12.8 04/08/2014 1612   ASSESSMENT/PLAN: 58 yo female with a past medical history of GERD, elevated blood pressure, hypercholesterolemia who seen in consultation at the request of Dr. Moreen Fowler to evaluate heme-positive stool.  1.  + FOBT -- CBC was checked today there is no evidence of anemia or microcytosis, however with her positive FOBT I recommend repeating a colonoscopy. It has been nearly 3 years. We discussed the risks and benefits and she is agreeable to proceed. If her colonoscopy is normal without evidence for polyps, she would not need another screening colonoscopy for 10 years. She understands and agrees with this recommendation and plan  2.  Elevated blood pressure --   Significantly elevated blood pressure today. I have asked that she discuss this with her primary doctor as soon as possible

## 2014-05-29 ENCOUNTER — Ambulatory Visit (AMBULATORY_SURGERY_CENTER): Payer: 59 | Admitting: Internal Medicine

## 2014-05-29 ENCOUNTER — Encounter: Payer: Self-pay | Admitting: Internal Medicine

## 2014-05-29 VITALS — BP 135/82 | HR 52 | Temp 96.5°F | Resp 25 | Ht 64.0 in | Wt 128.0 lb

## 2014-05-29 DIAGNOSIS — R195 Other fecal abnormalities: Secondary | ICD-10-CM

## 2014-05-29 MED ORDER — SODIUM CHLORIDE 0.9 % IV SOLN: 500.0000 mL | INTRAVENOUS | Status: DC

## 2014-05-29 NOTE — Op Note (Signed)
[facility] [address] [place], [address]   COLONOSCOPY PROCEDURE REPORT  PATIENT: Julia Kim, Julia Kim  MR#: [id number] BIRTHDATE: [birth year] , 58  yrs. old GENDER: Female ENDOSCOPIST: Beverley Fiedler, MD REFERRED IH:WTUUE Swayne, M.D. PROCEDURE DATE:  05/29/2014 PROCEDURE:   Colonoscopy, diagnostic First Screening Colonoscopy - Avg.  risk and is 50 yrs.  old or older - No.  Prior Negative Screening - Now for repeat screening. N/A  History of Adenoma - Now for follow-up colonoscopy & has been > or = to 3 yrs.  N/A  Polyps Removed Today? No.  Recommend repeat exam, <10 yrs? No. ASA CLASS:   Class II INDICATIONS:heme-positive stool. MEDICATIONS: MAC sedation, administered by CRNA and propofol (Diprivan) 200mg  IV  DESCRIPTION OF PROCEDURE:   After the risks benefits and alternatives of the procedure were thoroughly explained, informed consent was obtained.  A digital rectal exam revealed no rectal mass.   The LB PFC-H190 [id number]  endoscope was introduced through the anus and advanced to the cecum, which was identified by both the appendix and ileocecal valve. No adverse events experienced. The quality of the prep was good, using MoviPrep  The instrument was then slowly withdrawn as the colon was fully examined.      COLON FINDINGS: There was mild scattered diverticulosis noted.   The colon was otherwise normal.  There was no inflammation, polyps or cancers seen.  Retroflexed views revealed no abnormalities. The time to cecum=4 minutes 40 seconds.  Withdrawal time=8 minutes 52 seconds.  The scope was withdrawn and the procedure completed.  COMPLICATIONS: There were no complications.  ENDOSCOPIC IMPRESSION: 1.   There was mild diverticulosis noted 2.   The colon was otherwise normal  RECOMMENDATIONS: You should continue to follow colorectal cancer screening guidelines for "routine risk" patients with a repeat colonoscopy in 10 years. There is no need for  FOBT (stool) testing for at least 5 years.   eSigned:  Beverley Fiedler, MD 05/29/2014 9:49 AM   cc: The Patient and Tally Joe, MD

## 2014-05-29 NOTE — Patient Instructions (Signed)
Discharge instructions given with verbal understanding. Handouts on diverticulosis and a high fiber diet. Resume previous medications. YOU HAD AN ENDOSCOPIC PROCEDURE TODAY AT THE [facility]: Refer to the procedure report that was given to you for any specific questions about what was found during the examination.  If the procedure report does not answer your questions, please call your gastroenterologist to clarify.  If you requested that your care partner not be given the details of your procedure findings, then the procedure report has been included in a sealed envelope for you to review at your convenience later.  YOU SHOULD EXPECT: Some feelings of bloating in the abdomen. Passage of more gas than usual.  Walking can help get rid of the air that was put into your GI tract during the procedure and reduce the bloating. If you had a lower endoscopy (such as a colonoscopy or flexible sigmoidoscopy) you may notice spotting of blood in your stool or on the toilet paper. If you underwent a bowel prep for your procedure, then you may not have a normal bowel movement for a few days.  DIET: Your first meal following the procedure should be a light meal and then it is ok to progress to your normal diet.  A half-sandwich or bowl of soup is an example of a good first meal.  Heavy or fried foods are harder to digest and may make you feel nauseous or bloated.  Likewise meals heavy in dairy and vegetables can cause extra gas to form and this can also increase the bloating.  Drink plenty of fluids but you should avoid alcoholic beverages for 24 hours.  ACTIVITY: Your care partner should take you home directly after the procedure.  You should plan to take it easy, moving slowly for the rest of the day.  You can resume normal activity the day after the procedure however you should NOT DRIVE or use heavy machinery for 24 hours (because of the sedation medicines used during the test).    SYMPTOMS TO REPORT  IMMEDIATELY: A gastroenterologist can be reached at any hour.  During normal business hours, 8:30 AM to 5:00 PM Monday through Friday, call [phone].  After hours and on weekends, please call the GI answering service at [phone] who will take a message and have the physician on call contact you.   Following lower endoscopy (colonoscopy or flexible sigmoidoscopy):  Excessive amounts of blood in the stool  Significant tenderness or worsening of abdominal pains  Swelling of the abdomen that is new, acute  Fever of 100F or higher FOLLOW UP: If any biopsies were taken you will be contacted by phone or by letter within the next 1-3 weeks.  Call your gastroenterologist if you have not heard about the biopsies in 3 weeks.  Our staff will call the home number listed on your records the next business day following your procedure to check on you and address any questions or concerns that you may have at that time regarding the information given to you following your procedure. This is a courtesy call and so if there is no answer at the home number and we have not heard from you through the emergency physician on call, we will assume that you have returned to your regular daily activities without incident.  SIGNATURES/CONFIDENTIALITY: You and/or your care partner have signed paperwork which will be entered into your electronic medical record.  These signatures attest to the fact that that the information above on your After   Visit Summary has been reviewed and is understood.  Full responsibility of the confidentiality of this discharge information lies with you and/or your care-partner. 

## 2014-05-29 NOTE — Progress Notes (Signed)
Pt stable to RR 

## 2014-05-30 ENCOUNTER — Telehealth: Payer: Self-pay | Admitting: *Deleted

## 2014-05-30 NOTE — Telephone Encounter (Signed)
No answer, left message to call if questions or concerns. 

## 2014-10-30 ENCOUNTER — Other Ambulatory Visit: Payer: Self-pay

## 2014-10-30 DIAGNOSIS — Z1231 Encounter for screening mammogram for malignant neoplasm of breast: Secondary | ICD-10-CM

## 2014-11-24 ENCOUNTER — Ambulatory Visit
Admission: RE | Admit: 2014-11-24 | Discharge: 2014-11-24 | Disposition: A | Discharge status: Home / Self Care | Payer: 59 | Source: Ambulatory Visit

## 2014-11-24 DIAGNOSIS — Z1231 Encounter for screening mammogram for malignant neoplasm of breast: Secondary | ICD-10-CM

## 2015-08-07 ENCOUNTER — Other Ambulatory Visit (HOSPITAL_COMMUNITY): Payer: Self-pay | Admitting: Family Medicine

## 2015-08-07 DIAGNOSIS — M5430 Sciatica, unspecified side: Secondary | ICD-10-CM

## 2015-08-10 ENCOUNTER — Ambulatory Visit (HOSPITAL_COMMUNITY)
Admission: RE | Admit: 2015-08-10 | Discharge: 2015-08-10 | Disposition: A | Discharge status: Home / Self Care | Payer: 59 | Source: Ambulatory Visit | Attending: Family Medicine | Admitting: Family Medicine

## 2015-08-10 DIAGNOSIS — M545 Low back pain: Secondary | ICD-10-CM | POA: Diagnosis not present

## 2015-08-10 DIAGNOSIS — M5430 Sciatica, unspecified side: Secondary | ICD-10-CM

## 2016-01-20 ENCOUNTER — Other Ambulatory Visit: Payer: Self-pay

## 2016-01-20 DIAGNOSIS — Z1231 Encounter for screening mammogram for malignant neoplasm of breast: Secondary | ICD-10-CM

## 2016-01-25 DIAGNOSIS — H40003 Preglaucoma, unspecified, bilateral: Secondary | ICD-10-CM | POA: Diagnosis not present

## 2016-01-25 DIAGNOSIS — H527 Unspecified disorder of refraction: Secondary | ICD-10-CM | POA: Diagnosis not present

## 2016-01-25 DIAGNOSIS — H269 Unspecified cataract: Secondary | ICD-10-CM | POA: Diagnosis not present

## 2016-01-25 DIAGNOSIS — H2513 Age-related nuclear cataract, bilateral: Secondary | ICD-10-CM | POA: Diagnosis not present

## 2016-02-22 ENCOUNTER — Ambulatory Visit
Admission: RE | Admit: 2016-02-22 | Discharge: 2016-02-22 | Disposition: A | Discharge status: Home / Self Care | Payer: 59 | Source: Ambulatory Visit

## 2016-02-22 DIAGNOSIS — Z1231 Encounter for screening mammogram for malignant neoplasm of breast: Secondary | ICD-10-CM

## 2016-03-04 MED FILL — AMLODIPINE BESYLATE 5 MG TA: 5 | 90 days supply | Qty: 90 | Fill #1

## 2016-03-23 MED FILL — VIT D2 1.25 MG (50,000 UNIT: 1.25 MG | 90 days supply | Qty: 12 | Fill #1

## 2016-05-25 MED FILL — TRAVATAN Z 0.004% EYE DROP: 0.004 | 25 days supply | Qty: 3 | Fill #0

## 2016-06-07 MED FILL — AMLODIPINE BESYLATE 5 MG TA: 5 | 90 days supply | Qty: 90 | Fill #0

## 2016-06-10 DIAGNOSIS — E559 Vitamin D deficiency, unspecified: Secondary | ICD-10-CM | POA: Diagnosis not present

## 2016-06-10 DIAGNOSIS — L932 Other local lupus erythematosus: Secondary | ICD-10-CM | POA: Diagnosis not present

## 2016-06-10 DIAGNOSIS — Z23 Encounter for immunization: Secondary | ICD-10-CM | POA: Diagnosis not present

## 2016-06-10 DIAGNOSIS — E782 Mixed hyperlipidemia: Secondary | ICD-10-CM | POA: Diagnosis not present

## 2016-06-10 DIAGNOSIS — I10 Essential (primary) hypertension: Secondary | ICD-10-CM | POA: Diagnosis not present

## 2016-06-10 DIAGNOSIS — Z1211 Encounter for screening for malignant neoplasm of colon: Secondary | ICD-10-CM | POA: Diagnosis not present

## 2016-06-10 DIAGNOSIS — Z0000 Encounter for general adult medical examination without abnormal findings: Secondary | ICD-10-CM | POA: Diagnosis not present

## 2016-06-10 DIAGNOSIS — Z1159 Encounter for screening for other viral diseases: Secondary | ICD-10-CM | POA: Diagnosis not present

## 2016-07-28 DIAGNOSIS — H2513 Age-related nuclear cataract, bilateral: Secondary | ICD-10-CM | POA: Diagnosis not present

## 2016-07-28 DIAGNOSIS — H527 Unspecified disorder of refraction: Secondary | ICD-10-CM | POA: Diagnosis not present

## 2016-07-28 DIAGNOSIS — H40003 Preglaucoma, unspecified, bilateral: Secondary | ICD-10-CM | POA: Diagnosis not present

## 2016-07-28 MED FILL — SIMBRINZA 1%-0.2% EYE DROPS: 1-0.2 | 80 days supply | Qty: 16 | Fill #0

## 2016-07-28 MED FILL — TRAVATAN Z 0.004% EYE DROP: 0.004 | 75 days supply | Qty: 15 | Fill #0

## 2016-08-01 MED FILL — SIMVASTATIN 40 MG TABLET: 40 | 90 days supply | Qty: 90 | Fill #0

## 2016-08-01 MED FILL — VIT D2 1.25 MG (50,000 UNIT: 1.25 MG | 84 days supply | Qty: 12 | Fill #0

## 2016-09-06 MED FILL — AMLODIPINE BESYLATE 5 MG TA: 5 | 90 days supply | Qty: 90 | Fill #0

## 2016-09-19 MED FILL — traMADol HCL 50 MG TABS: 50 | 7 days supply | Qty: 21 | Fill #0

## 2016-10-06 ENCOUNTER — Ambulatory Visit (HOSPITAL_COMMUNITY)
Admission: RE | Admit: 2016-10-06 | Discharge: 2016-10-06 | Disposition: A | Discharge status: Home / Self Care | Payer: 59 | Source: Ambulatory Visit | Attending: Family Medicine | Admitting: Family Medicine

## 2016-10-06 ENCOUNTER — Other Ambulatory Visit (HOSPITAL_COMMUNITY): Payer: Self-pay | Admitting: Family Medicine

## 2016-10-06 DIAGNOSIS — M79605 Pain in left leg: Secondary | ICD-10-CM | POA: Diagnosis not present

## 2016-10-06 DIAGNOSIS — M7989 Other specified soft tissue disorders: Principal | ICD-10-CM

## 2016-10-06 DIAGNOSIS — M1811 Unilateral primary osteoarthritis of first carpometacarpal joint, right hand: Secondary | ICD-10-CM | POA: Diagnosis not present

## 2016-10-06 DIAGNOSIS — M79662 Pain in left lower leg: Secondary | ICD-10-CM | POA: Insufficient documentation

## 2016-10-06 NOTE — Progress Notes (Signed)
VASCULAR LAB PRELIMINARY  PRELIMINARY  PRELIMINARY  PRELIMINARY  Left lower extremity venous duplex completed.    Preliminary report:  Left:  No evidence of DVT, superficial thrombosis, or Baker's cyst.  Julia Kim, RVS 10/06/2016, 2:35 PM

## 2016-10-20 DIAGNOSIS — M1811 Unilateral primary osteoarthritis of first carpometacarpal joint, right hand: Secondary | ICD-10-CM | POA: Diagnosis not present

## 2016-11-30 MED FILL — SIMVASTATIN 40 MG TABLET: 40 | 90 days supply | Qty: 90 | Fill #1

## 2016-11-30 MED FILL — AMLODIPINE BESYLATE 5 MG TA: 5 | 90 days supply | Qty: 90 | Fill #1

## 2016-11-30 MED FILL — VIT D2 1.25 MG (50,000 UNIT: 1.25 MG | 84 days supply | Qty: 12 | Fill #1

## 2016-12-15 DIAGNOSIS — H524 Presbyopia: Secondary | ICD-10-CM | POA: Diagnosis not present

## 2017-03-13 MED FILL — AMLODIPINE BESYLATE 5 MG TA: 5 | 90 days supply | Qty: 90 | Fill #2

## 2017-03-13 MED FILL — SIMVASTATIN 40 MG TABLET: 40 | 90 days supply | Qty: 90 | Fill #2

## 2017-03-13 MED FILL — VIT D2 1.25 MG (50,000 UNIT: 1.25 MG | 84 days supply | Qty: 12 | Fill #2

## 2017-05-04 DIAGNOSIS — R609 Edema, unspecified: Secondary | ICD-10-CM | POA: Diagnosis not present

## 2017-05-04 DIAGNOSIS — I10 Essential (primary) hypertension: Secondary | ICD-10-CM | POA: Diagnosis not present

## 2017-05-04 DIAGNOSIS — M25511 Pain in right shoulder: Secondary | ICD-10-CM | POA: Diagnosis not present

## 2017-05-04 MED FILL — HYDROCHLOROTHIAZIDE 25 MG T: 25 | 30 days supply | Qty: 30 | Fill #0

## 2017-05-11 DIAGNOSIS — H401131 Primary open-angle glaucoma, bilateral, mild stage: Secondary | ICD-10-CM | POA: Diagnosis not present

## 2017-05-11 MED FILL — SIMBRINZA 1%-0.2% EYE DROPS: 1-0.2 | 30 days supply | Qty: 8 | Fill #0

## 2017-05-11 MED FILL — TRAVATAN Z 0.004% EYE DROP: 0.004 | 30 days supply | Qty: 3 | Fill #0

## 2017-05-16 ENCOUNTER — Ambulatory Visit (INDEPENDENT_AMBULATORY_CARE_PROVIDER_SITE_OTHER): Payer: 59

## 2017-05-16 ENCOUNTER — Ambulatory Visit (INDEPENDENT_AMBULATORY_CARE_PROVIDER_SITE_OTHER): Payer: 59 | Admitting: Orthopaedic Surgery

## 2017-05-16 DIAGNOSIS — M7541 Impingement syndrome of right shoulder: Secondary | ICD-10-CM | POA: Diagnosis not present

## 2017-05-16 DIAGNOSIS — M25511 Pain in right shoulder: Secondary | ICD-10-CM | POA: Diagnosis not present

## 2017-05-16 DIAGNOSIS — G8929 Other chronic pain: Secondary | ICD-10-CM

## 2017-05-16 NOTE — Progress Notes (Signed)
Office Visit Note   Patient: Julia Kim           Date of Birth: 09/15/1956           MRN: [id number] Visit Date: 05/16/2017              Requested by: Tally JoeSwayne, David, MD [phone] [place], [place] [id number] PCP: Tally JoeSwayne, David, MD   Assessment & Plan: Visit Diagnoses:  1. Impingement syndrome of right shoulder     Plan: Impression is right subacromial bursitis versus early frozen shoulder. Subacromial injection was performed today. NSAIDs as needed. Questions encouraged and answered. Follow-up with me as needed.  Follow-Up Instructions: Return if symptoms worsen or fail to improve.   Orders:  Orders Placed This Encounter  Procedures  . XR Shoulder Right   No orders of the defined types were placed in this encounter.     Procedures: Large Joint Inj Date/Time: 05/16/2017 10:40 AM Performed by: Tarry KosXU, NAIPING M Authorized by: Tarry KosXU, NAIPING M   Consent Given by:  Patient Timeout: prior to procedure the correct patient, procedure, and site was verified   Indications:  Pain Location:  Shoulder Site:  R subacromial bursa Prep: patient was prepped and draped in usual sterile fashion   Needle Size:  22 G Approach:  Posterior Ultrasound Guidance: No   Fluoroscopic Guidance: No       Clinical Data: No additional findings.   Subjective: Chief Complaint  Patient presents with  . Right Shoulder - Pain    Patient is a 61 year old female with right shoulder pain for 1 month. Causing her burning at times. The pain radiates down into her elbow. She has pain with certain movements when reaching back. She denies any radicular symptoms. Occasional radiation of pain into the neck.    Review of Systems  Constitutional: Negative.   HENT: Negative.   Eyes: Negative.   Respiratory: Negative.   Cardiovascular: Negative.   Endocrine: Negative.   Musculoskeletal: Negative.   Neurological: Negative.   Hematological: Negative.   Psychiatric/Behavioral:  Negative.   All other systems reviewed and are negative.    Objective: Vital Signs: There were no vitals taken for this visit.  Physical Exam  Constitutional: She is oriented to person, place, and time. She appears well-developed and well-nourished.  HENT:  Head: Normocephalic and atraumatic.  Eyes: EOM are normal.  Neck: Neck supple.  Pulmonary/Chest: Effort normal.  Abdominal: Soft.  Neurological: She is alert and oriented to person, place, and time.  Skin: Skin is warm. Capillary refill takes less than 2 seconds.  Psychiatric: She has a normal mood and affect. Her behavior is normal. Judgment and thought content normal.  Nursing note and vitals reviewed.   Ortho Exam Right shoulder exam shows positive Hawkins impingement, negative Neer impingement. Rotator cuff testing is normal. Negative cross adduction. Range of motion is normal. Specialty Comments:  No specialty comments available.  Imaging: Xr Shoulder Right  Result Date: 05/16/2017 No structural or acute abnormalities    PMFS History: Patient Active Problem List   Diagnosis Date Noted  . Impingement syndrome of right shoulder 05/16/2017  . Special screening for malignant neoplasms, colon 11/28/2011  . Diverticulosis of colon (without mention of hemorrhage) 11/28/2011  . Abdominal pain 11/15/2011  . Polyp of gallbladder 11/15/2011   Past Medical History:  Diagnosis Date  . Allergic sinusitis   . Allergy   . Cutaneous lupus erythematosus   . Elevated blood pressure (not hypertension)   .  Flushing   . Full dentures   . GERD (gastroesophageal reflux disease) 06/29/2005   EGD ,( Dr. Jarold Motto)  . Glaucoma   . Hypercholesterolemia   . Unspecified vitamin D deficiency   . Wears glasses     Family History  Problem Relation Age of Onset  . Heart disease Mother   . Hypertension Mother   . Kidney disease Mother   . Hypertension Father   . Stroke Maternal Grandmother   . Colon cancer Neg Hx     Past  Surgical History:  Procedure Laterality Date  . CESAREAN SECTION  1975  . COLONOSCOPY    . FOOT SURGERY  2002   Right   . ULNAR NERVE TRANSPOSITION Right 09/20/2013   Procedure: RIGHT ULNAR NERVE DECOMPRESSION;  Surgeon: Nicki Reaper, MD;  Location: [facility];  Service: Orthopedics;  Laterality: Right;  . UPPER GASTROINTESTINAL ENDOSCOPY    . UPPER GI ENDOSCOPY     Social History   Occupational History  . Nurse [employer]  .  [employer]   Social History Main Topics  . Smoking status: Former Smoker    Quit date: 12/19/1992  . Smokeless tobacco: Never Used  . Alcohol use No  . Drug use: No  . Sexual activity: Not on file

## 2017-06-05 MED FILL — HYDROCHLOROTHIAZIDE 25 MG T: 25 | 30 days supply | Qty: 30 | Fill #1

## 2017-06-12 DIAGNOSIS — E559 Vitamin D deficiency, unspecified: Secondary | ICD-10-CM | POA: Diagnosis not present

## 2017-06-12 DIAGNOSIS — E782 Mixed hyperlipidemia: Secondary | ICD-10-CM | POA: Diagnosis not present

## 2017-06-12 DIAGNOSIS — N951 Menopausal and female climacteric states: Secondary | ICD-10-CM | POA: Diagnosis not present

## 2017-06-12 DIAGNOSIS — M79641 Pain in right hand: Secondary | ICD-10-CM | POA: Diagnosis not present

## 2017-06-12 DIAGNOSIS — I10 Essential (primary) hypertension: Secondary | ICD-10-CM | POA: Diagnosis not present

## 2017-06-12 DIAGNOSIS — L932 Other local lupus erythematosus: Secondary | ICD-10-CM | POA: Diagnosis not present

## 2017-06-12 MED FILL — VIT D2 1.25 MG (50,000 UNIT: 1.25 MG | 84 days supply | Qty: 12 | Fill #0

## 2017-06-12 MED FILL — AMLODIPINE BESYLATE 2.5 MG: 2.5 | 90 days supply | Qty: 90 | Fill #0

## 2017-06-12 MED FILL — SIMVASTATIN 40 MG TABLET: 40 | 90 days supply | Qty: 90 | Fill #0

## 2017-06-14 MED FILL — SPIRONOLACTONE 25 MG TABLET: 25 | 30 days supply | Qty: 30 | Fill #0

## 2017-07-03 DIAGNOSIS — M1811 Unilateral primary osteoarthritis of first carpometacarpal joint, right hand: Secondary | ICD-10-CM | POA: Diagnosis not present

## 2017-07-03 DIAGNOSIS — M65311 Trigger thumb, right thumb: Secondary | ICD-10-CM | POA: Diagnosis not present

## 2017-07-13 MED FILL — SPIRONOLACTONE 25 MG TABLET: 25 | 30 days supply | Qty: 30 | Fill #1

## 2017-07-18 DIAGNOSIS — E876 Hypokalemia: Secondary | ICD-10-CM | POA: Diagnosis not present

## 2017-07-28 DIAGNOSIS — M545 Low back pain: Secondary | ICD-10-CM | POA: Diagnosis not present

## 2017-07-28 MED FILL — CYCLOBENZAPRINE 10 MG TAB: 10 | 4 days supply | Qty: 10 | Fill #0

## 2017-07-28 MED FILL — predniSONE 20 MG TABS: 20 | 9 days supply | Qty: 18 | Fill #0

## 2017-08-16 DIAGNOSIS — M65311 Trigger thumb, right thumb: Secondary | ICD-10-CM | POA: Diagnosis not present

## 2017-08-16 MED FILL — SPIRONOLACTONE 25 MG TABLET: 25 | 30 days supply | Qty: 30 | Fill #2

## 2017-08-18 DIAGNOSIS — M25552 Pain in left hip: Secondary | ICD-10-CM | POA: Diagnosis not present

## 2017-08-18 MED FILL — MELOXICAM 15 MG TABLET: 15 | 30 days supply | Qty: 30 | Fill #0

## 2017-08-23 ENCOUNTER — Ambulatory Visit
Admission: RE | Admit: 2017-08-23 | Discharge: 2017-08-23 | Disposition: A | Discharge status: Home / Self Care | Payer: 59 | Source: Ambulatory Visit | Attending: Family Medicine | Admitting: Family Medicine

## 2017-08-23 ENCOUNTER — Other Ambulatory Visit: Payer: Self-pay | Admitting: Family Medicine

## 2017-08-23 DIAGNOSIS — M25552 Pain in left hip: Secondary | ICD-10-CM | POA: Diagnosis not present

## 2017-08-23 DIAGNOSIS — S79912A Unspecified injury of left hip, initial encounter: Secondary | ICD-10-CM | POA: Diagnosis not present

## 2017-08-24 ENCOUNTER — Other Ambulatory Visit: Payer: Self-pay | Admitting: Orthopedic Surgery

## 2017-09-05 ENCOUNTER — Ambulatory Visit: Payer: 59 | Attending: Family Medicine | Admitting: Physical Therapy

## 2017-09-05 ENCOUNTER — Encounter: Payer: Self-pay | Admitting: Physical Therapy

## 2017-09-05 DIAGNOSIS — R262 Difficulty in walking, not elsewhere classified: Secondary | ICD-10-CM | POA: Diagnosis present

## 2017-09-05 DIAGNOSIS — M25552 Pain in left hip: Secondary | ICD-10-CM | POA: Diagnosis not present
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
## 2017-09-06 MED FILL — AMLODIPINE BESYLATE 2.5 MG: 2.5 | 90 days supply | Qty: 90 | Fill #1

## 2017-09-06 MED FILL — VIT D2 1.25 MG (50,000 UNIT: 1.25 MG | 84 days supply | Qty: 12 | Fill #1 | Status: TO

## 2017-09-06 MED FILL — TRAVATAN Z 0.004% EYE DROP: 0.004 | 30 days supply | Qty: 3 | Fill #1

## 2017-09-06 MED FILL — SIMVASTATIN 40 MG TABLET: 40 | 90 days supply | Qty: 90 | Fill #1 | Status: TO

## 2017-09-10 MED FILL — SPIRONOLACTONE 25 MG TABLET: 25 | 90 days supply | Qty: 90 | Fill #3

## 2017-09-11 ENCOUNTER — Encounter (HOSPITAL_BASED_OUTPATIENT_CLINIC_OR_DEPARTMENT_OTHER): Payer: Self-pay | Admitting: *Deleted

## 2017-09-13 ENCOUNTER — Encounter (HOSPITAL_BASED_OUTPATIENT_CLINIC_OR_DEPARTMENT_OTHER)
Admission: RE | Admit: 2017-09-13 | Discharge: 2017-09-13 | Disposition: A | Discharge status: Home / Self Care | Payer: 59 | Source: Ambulatory Visit | Attending: Orthopedic Surgery | Admitting: Orthopedic Surgery

## 2017-09-13 ENCOUNTER — Ambulatory Visit: Payer: 59 | Admitting: Physical Therapy

## 2017-09-13 ENCOUNTER — Other Ambulatory Visit: Payer: Self-pay

## 2017-09-13 ENCOUNTER — Encounter: Payer: Self-pay | Admitting: Physical Therapy

## 2017-09-13 DIAGNOSIS — I10 Essential (primary) hypertension: Secondary | ICD-10-CM | POA: Diagnosis not present

## 2017-09-13 DIAGNOSIS — K219 Gastro-esophageal reflux disease without esophagitis: Secondary | ICD-10-CM | POA: Diagnosis not present

## 2017-09-13 DIAGNOSIS — R262 Difficulty in walking, not elsewhere classified: Secondary | ICD-10-CM | POA: Diagnosis not present

## 2017-09-13 DIAGNOSIS — M25552 Pain in left hip: Secondary | ICD-10-CM | POA: Diagnosis not present

## 2017-09-13 DIAGNOSIS — M329 Systemic lupus erythematosus, unspecified: Secondary | ICD-10-CM | POA: Diagnosis not present

## 2017-09-13 DIAGNOSIS — M67441 Ganglion, right hand: Secondary | ICD-10-CM | POA: Diagnosis not present

## 2017-09-13 DIAGNOSIS — Z79899 Other long term (current) drug therapy: Secondary | ICD-10-CM | POA: Diagnosis not present

## 2017-09-13 DIAGNOSIS — M65841 Other synovitis and tenosynovitis, right hand: Secondary | ICD-10-CM | POA: Diagnosis not present

## 2017-09-13 DIAGNOSIS — Z87891 Personal history of nicotine dependence: Secondary | ICD-10-CM | POA: Diagnosis not present

## 2017-09-13 LAB — BASIC METABOLIC PANEL
Anion gap: 6 (ref 5–15)
BUN: 8 mg/dL (ref 6–20)
CO2: 28 mmol/L (ref 22–32)
Calcium: 9.6 mg/dL (ref 8.9–10.3)
Chloride: 106 mmol/L (ref 101–111)
Creatinine, Ser: 0.9 mg/dL (ref 0.44–1.00)
GFR calc Af Amer: >60 mL/min (ref >60)
GFR calc non Af Amer: >60 mL/min (ref >60)
Glucose, Bld: 78 mg/dL (ref 65–99)
Potassium: 4 mmol/L (ref 3.5–5.1)
Sodium: 140 mmol/L (ref 135–145)

## 2017-09-13 NOTE — Therapy (Signed)
[facility] [address] [place], [place], [address] Phone: [phone]   Fax:  [phone]  Physical Therapy Treatment  Patient Details  Name: KATIYA FIKE MRN: [id number] Date of Birth: [birth year] Referring Provider: Tally Joe, MD  Encounter Date: 09/13/2017      PT End of Session - 09/13/17 1237    Visit Number 2   Number of Visits 13   Date for PT Re-Evaluation 10/20/17   PT Start Time 0733   PT Stop Time 0802   PT Time Calculation (min) 29 min   Activity Tolerance Patient tolerated treatment well   Behavior During Therapy [facility] for tasks assessed/performed      Past Medical History:  Diagnosis Date  . Allergic sinusitis   . Allergy   . Cutaneous lupus erythematosus   . Elevated blood pressure (not hypertension)   . Flushing   . Full dentures   . GERD (gastroesophageal reflux disease) 06/29/2005   EGD ,( Dr. Jarold Motto)  . Glaucoma   . Hypercholesterolemia   . Hyperlipidemia   . Hypertension   . Stenosing tenosynovitis of finger    R  Thumb  . Unspecified vitamin D deficiency   . Wears glasses     Past Surgical History:  Procedure Laterality Date  . CESAREAN SECTION  1975  . COLONOSCOPY    . FOOT SURGERY  2002   Right   . ULNAR NERVE TRANSPOSITION Right 09/20/2013   Procedure: RIGHT ULNAR NERVE DECOMPRESSION;  Surgeon: Nicki Reaper, MD;  Location: [facility];  Service: Orthopedics;  Laterality: Right;  . UPPER GASTROINTESTINAL ENDOSCOPY    . UPPER GI ENDOSCOPY      There were no vitals filed for this visit.      Subjective Assessment - 09/13/17 0755    Subjective Less pain meds,  Doing her exercises.  no pain right at the moment.  Uses less prescription meds.   Pain Score 5    Pain Location Hip   Pain Orientation Left   Pain Descriptors / Indicators Aching  catches   Aggravating Factors  walking,standing long periods   Pain Relieving Factors muscle relaxers                          [facility] Adult PT Treatment/Exercise - 09/13/17 0001      Knee/Hip Exercises: Stretches   Passive Hamstring Stretch Limitations seated edge of bed  3 X 30,  minor cues   Piriformis Stretch Limitations supine & seated  3 x 30 each     Knee/Hip Exercises: Supine   Other Supine Knee/Hip Exercises posterior pelvic tilt  10 x moderate cues for technique     Knee/Hip Exercises: Sidelying   Clams 10 X     Manual Therapy   Manual Therapy Soft tissue mobilization   Soft tissue mobilization soft tissue work,  retrograde to decrease congestion,  IASTM used intermittantly. ,  tissue softened.                  PT Education - 09/13/17 1236    Education provided Yes   Education Details How to decrease swelling.   exercise techniques   Person(s) Educated Patient   Methods Explanation   Comprehension Verbalized understanding             PT Long Term Goals - 09/05/17 1511      PT LONG TERM GOAL #1   Title Pt will be  able to walk for at least 30 min without limitation by hip pain   Baseline 5 min at eval   Time 6   Period Weeks   Status New   Target Date 10/20/17     PT LONG TERM GOAL #2   Title FOTO to 24% limitation to indicate significant improvement in functional ability   Baseline 33% limited at eval   Time 6   Period Weeks   Status New   Target Date 10/20/17     PT LONG TERM GOAL #3   Title L hip abduction to 5/5 for proper support to lumbopelvic biomechanical chain   Baseline 4/5 at eval with pain   Time 6   Period Weeks   Status New   Target Date 10/20/17     PT LONG TERM GOAL #4   Title Pt will demo gait pattern without trendelenburg pattern on L side to indicate appropraite activation of hip abductors during gait   Baseline pattern noted at eval resulting in pain   Time 6   Period Weeks   Status New   Target Date 10/20/17               Plan - 09/13/17 1237    Clinical Impression Statement Hip ROm and  pain improving.  She needs cues for HEP.  Limp improwing with increased glut med activation.   PT Next Visit Plan manual or TPDN to hip abductors, abdominal engagement, glut activation   PT Home Exercise Plan piriformis stretch-supine & seated, seated hamstring stretch, clam, post pelvic tilt   Consulted and Agree with Plan of Care Patient      Patient will benefit from skilled therapeutic intervention in order to improve the following deficits and impairments:     Visit Diagnosis: Pain in left hip  Difficulty in walking, not elsewhere classified     Problem List Patient Active Problem List   Diagnosis Date Noted  . Impingement syndrome of right shoulder 05/16/2017  . Special screening for malignant neoplasms, colon 11/28/2011  . Diverticulosis of colon (without mention of hemorrhage) 11/28/2011  . Abdominal pain 11/15/2011  . Polyp of gallbladder 11/15/2011    Jackalyn Haith PTA 09/13/2017, 12:39 PM  [facility] [address] [place], [place], [address] Phone: [phone]   Fax:  [phone]  Name: SANTOS HARDWICK MRN: [id number] Date of Birth: 1956-03-14

## 2017-09-15 ENCOUNTER — Encounter (HOSPITAL_BASED_OUTPATIENT_CLINIC_OR_DEPARTMENT_OTHER): Payer: Self-pay | Admitting: Emergency Medicine

## 2017-09-15 ENCOUNTER — Ambulatory Visit (HOSPITAL_BASED_OUTPATIENT_CLINIC_OR_DEPARTMENT_OTHER)
Admission: RE | Admit: 2017-09-15 | Discharge: 2017-09-15 | Disposition: A | Discharge status: Home / Self Care | Payer: 59 | Source: Ambulatory Visit | Attending: Orthopedic Surgery | Admitting: Orthopedic Surgery

## 2017-09-15 ENCOUNTER — Ambulatory Visit (HOSPITAL_BASED_OUTPATIENT_CLINIC_OR_DEPARTMENT_OTHER): Payer: 59 | Admitting: Anesthesiology

## 2017-09-15 ENCOUNTER — Encounter (HOSPITAL_BASED_OUTPATIENT_CLINIC_OR_DEPARTMENT_OTHER)
Admission: RE | Disposition: A | Discharge status: Home / Self Care | Payer: Self-pay | Source: Ambulatory Visit | Attending: Orthopedic Surgery

## 2017-09-15 DIAGNOSIS — M65841 Other synovitis and tenosynovitis, right hand: Secondary | ICD-10-CM | POA: Insufficient documentation

## 2017-09-15 DIAGNOSIS — Z79899 Other long term (current) drug therapy: Secondary | ICD-10-CM | POA: Diagnosis not present

## 2017-09-15 DIAGNOSIS — Z87891 Personal history of nicotine dependence: Secondary | ICD-10-CM | POA: Insufficient documentation

## 2017-09-15 DIAGNOSIS — M67441 Ganglion, right hand: Secondary | ICD-10-CM | POA: Diagnosis not present

## 2017-09-15 DIAGNOSIS — I10 Essential (primary) hypertension: Secondary | ICD-10-CM | POA: Diagnosis not present

## 2017-09-15 DIAGNOSIS — M65311 Trigger thumb, right thumb: Secondary | ICD-10-CM | POA: Diagnosis not present

## 2017-09-15 DIAGNOSIS — K219 Gastro-esophageal reflux disease without esophagitis: Secondary | ICD-10-CM | POA: Diagnosis not present

## 2017-09-15 DIAGNOSIS — M329 Systemic lupus erythematosus, unspecified: Secondary | ICD-10-CM | POA: Diagnosis not present

## 2017-09-15 HISTORY — DX: Essential (primary) hypertension: I10

## 2017-09-15 HISTORY — PX: TRIGGER FINGER RELEASE: SHX641

## 2017-09-15 HISTORY — DX: Trigger finger, unspecified finger: M65.30

## 2017-09-15 HISTORY — DX: Hyperlipidemia, unspecified: E78.5

## 2017-09-15 SURGERY — RELEASE, A1 PULLEY, FOR TRIGGER FINGER
Anesthesia: Regional | Site: Thumb | Laterality: Right

## 2017-09-15 MED ORDER — ONDANSETRON HCL 4 MG/2ML IJ SOLN: 4.0000 mg | Freq: Once | INTRAMUSCULAR | Status: DC | PRN

## 2017-09-15 MED ORDER — HYDROCODONE-ACETAMINOPHEN 5-325 MG PO TABS: 1.0000 | ORAL_TABLET | Freq: Four times a day (QID) | ORAL | 0 refills | Status: DC | PRN

## 2017-09-15 MED ORDER — BUPIVACAINE HCL (PF) 0.25 % IJ SOLN
INTRAMUSCULAR | Status: DC | PRN
  Administered 2017-09-15: 4 mL

## 2017-09-15 MED ORDER — MIDAZOLAM HCL 2 MG/2ML IJ SOLN
1.0000 mg | INTRAMUSCULAR | Status: DC | PRN
  Administered 2017-09-15: 2 mg via INTRAVENOUS

## 2017-09-15 MED ORDER — LACTATED RINGERS IV SOLN
INTRAVENOUS | Status: DC
  Administered 2017-09-15 (×2): via INTRAVENOUS

## 2017-09-15 MED ORDER — SCOPOLAMINE 1 MG/3DAYS TD PT72: 1.0000 | MEDICATED_PATCH | Freq: Once | TRANSDERMAL | Status: DC | PRN

## 2017-09-15 MED ORDER — ONDANSETRON HCL 4 MG/2ML IJ SOLN
INTRAMUSCULAR | Status: DC | PRN
  Administered 2017-09-15: 4 mg via INTRAVENOUS

## 2017-09-15 MED ORDER — CEFAZOLIN SODIUM-DEXTROSE 2-4 GM/100ML-% IV SOLN
2.0000 g | INTRAVENOUS | Status: AC
  Administered 2017-09-15: 2 g via INTRAVENOUS

## 2017-09-15 MED ORDER — MIDAZOLAM HCL 2 MG/2ML IJ SOLN
INTRAMUSCULAR | Status: AC
  Filled 2017-09-15: qty 2

## 2017-09-15 MED ORDER — LIDOCAINE HCL (PF) 0.5 % IJ SOLN
INTRAMUSCULAR | Status: DC | PRN
  Administered 2017-09-15: 50 mL via INTRAVENOUS

## 2017-09-15 MED ORDER — ONDANSETRON HCL 4 MG/2ML IJ SOLN
INTRAMUSCULAR | Status: AC
  Filled 2017-09-15: qty 2

## 2017-09-15 MED ORDER — CHLORHEXIDINE GLUCONATE 4 % EX LIQD: 60.0000 mL | Freq: Once | CUTANEOUS | Status: DC

## 2017-09-15 MED ORDER — FENTANYL CITRATE (PF) 100 MCG/2ML IJ SOLN
50.0000 ug | INTRAMUSCULAR | Status: DC | PRN
  Administered 2017-09-15: 50 ug via INTRAVENOUS

## 2017-09-15 MED ORDER — CEFAZOLIN SODIUM-DEXTROSE 2-4 GM/100ML-% IV SOLN
INTRAVENOUS | Status: AC
  Filled 2017-09-15: qty 100

## 2017-09-15 MED ORDER — FENTANYL CITRATE (PF) 100 MCG/2ML IJ SOLN
INTRAMUSCULAR | Status: AC
  Filled 2017-09-15: qty 2

## 2017-09-15 MED ORDER — FENTANYL CITRATE (PF) 100 MCG/2ML IJ SOLN: 25.0000 ug | INTRAMUSCULAR | Status: DC | PRN

## 2017-09-15 MED FILL — HYDROCODON-APAP 5-325: 5-325 | 5 days supply | Qty: 20 | Fill #0

## 2017-09-15 SURGICAL SUPPLY — 31 items
BANDAGE COBAN STERILE 2 (GAUZE/BANDAGES/DRESSINGS) ×3 IMPLANT
BLADE SURG 15 STRL LF DISP TIS (BLADE) ×1 IMPLANT
BLADE SURG 15 STRL SS (BLADE) ×2
BNDG ESMARK 4X9 LF (GAUZE/BANDAGES/DRESSINGS) ×3 IMPLANT
CHLORAPREP W/TINT 26ML (MISCELLANEOUS) ×3 IMPLANT
CORD BIPOLAR FORCEPS 12FT (ELECTRODE) IMPLANT
COVER BACK TABLE 60X90IN (DRAPES) ×3 IMPLANT
COVER MAYO STAND STRL (DRAPES) ×3 IMPLANT
CUFF TOURNIQUET SINGLE 18IN (TOURNIQUET CUFF) ×3 IMPLANT
DECANTER SPIKE VIAL GLASS SM (MISCELLANEOUS) IMPLANT
DRAPE EXTREMITY T 121X128X90 (DRAPE) ×3 IMPLANT
DRAPE SURG 17X23 STRL (DRAPES) ×3 IMPLANT
GAUZE SPONGE 4X4 12PLY STRL (GAUZE/BANDAGES/DRESSINGS) ×3 IMPLANT
GAUZE XEROFORM 1X8 LF (GAUZE/BANDAGES/DRESSINGS) ×3 IMPLANT
GLOVE BIOGEL PI IND STRL 8.5 (GLOVE) ×1 IMPLANT
GLOVE BIOGEL PI INDICATOR 8.5 (GLOVE) ×2
GLOVE EXAM NITRILE MD LF STRL (GLOVE) ×3 IMPLANT
GLOVE SURG ORTHO 8.0 STRL STRW (GLOVE) ×3 IMPLANT
GLOVE SURG SS PI 7.0 STRL IVOR (GLOVE) ×3 IMPLANT
GOWN STRL REUS W/ TWL LRG LVL3 (GOWN DISPOSABLE) ×1 IMPLANT
GOWN STRL REUS W/TWL LRG LVL3 (GOWN DISPOSABLE) ×2
GOWN STRL REUS W/TWL XL LVL3 (GOWN DISPOSABLE) ×3 IMPLANT
NEEDLE PRECISIONGLIDE 27X1.5 (NEEDLE) ×3 IMPLANT
NS IRRIG 1000ML POUR BTL (IV SOLUTION) ×3 IMPLANT
PACK BASIN DAY SURGERY FS (CUSTOM PROCEDURE TRAY) ×3 IMPLANT
STOCKINETTE 4X48 STRL (DRAPES) ×3 IMPLANT
SUT ETHILON 4 0 PS 2 18 (SUTURE) ×3 IMPLANT
SYR BULB 3OZ (MISCELLANEOUS) ×3 IMPLANT
SYR CONTROL 10ML LL (SYRINGE) ×3 IMPLANT
TOWEL OR 17X24 6PK STRL BLUE (TOWEL DISPOSABLE) ×3 IMPLANT
UNDERPAD 30X30 (UNDERPADS AND DIAPERS) ×3 IMPLANT

## 2017-09-15 NOTE — Op Note (Signed)
Julia Kim, Julia Kim              ACCOUNT NO.:  [id number]  MEDICAL RECORD NO.:  [id number]  LOCATION:                                 FACILITY:  PHYSICIAN:  Cindee Salt, M.D.            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  OPERATIONS:  Release of A1 pulley, excision of flexor sheath cyst, right thumb.  SURGEON:  Cindee Salt, MD.  ANESTHESIA:  Forearm-based IV regional with local infiltration and IV sedation.  PLACE OF SURGERY:  [facility].  ANESTHESIOLOGIST:  Desmond Lope.  HISTORY:  The patient is a 61 year old female with a history of triggering of her right thumb.  A mass has developed.  This has not responded to conservative treatment.  She has elected to undergo surgical release of the A1 pulley along with excision of the probable mass.  Pre, peri, and postoperative courses have been discussed along with risks and complications.  She is aware there is no guarantee to the surgery, possibility of infection, recurrence of injury to arteries/nerves/tendons, incomplete relief of symptoms, and dystrophy. In the preoperative area, the patient is seen, the extremity marked by both patient and surgeon, antibiotic given.  DESCRIPTION OF PROCEDURE:  The patient was brought to the operating room where a forearm-based IV regional anesthetic was carried out without difficulty.  She was prepped using ChloraPrep in supine position with the right arm free.  A 3-minute dry time was allowed and time-out was taken confirming the patient and procedure.  A transverse incision was made over the A1 pulley of the right thumb, carried down through subcutaneous tissue.  Bleeders were electrocauterized with bipolar.   A cyst was immediately encountered.  This was dissected free.  The specimen was sent to Pathology.  The A1 pulley was then released on its radial aspect after localization and protection of radial and ulnar digital neurovascular bundles.  The cyst was removed with blunt dissection primarily.  The A1 pulley was extremely tight and thickened. The oblique pulley was left intact.  The tenosynovial proximally was separated with blunt dissection.  The thumb was placed through a full range motion and no further triggering was noted.  The wound was copiously irrigated with saline.  The skin was closed with interrupted 4- 0 nylon sutures.  A local infiltration with 0.25% bupivacaine without epinephrine was given.  Approximately 4-5 mL was used.  A sterile compressive dressing with the fingers free was applied.  On deflation of the tourniquet, all fingers immediately pinked.  She was taken to the recovery room for observation in satisfactory condition. She will be discharged to home, to return to [facility] in 1 week, on [facility].          ______________________________ Cindee Salt, M.D.     GK/MEDQ  D:  09/15/2017  T:  09/15/2017  Job:  [id number]

## 2017-09-15 NOTE — Brief Op Note (Signed)
09/15/2017  1:48 PM  PATIENT:  Julia Kim  61 y.o. female  PRE-OPERATIVE DIAGNOSIS:  STENOSING TENOSYNOVITIS RIGHT THUMB  POST-OPERATIVE DIAGNOSIS:  STENOSING TENOSYNOVITIS RIGHT THUMB  PROCEDURE:  Procedure(s) with comments: RELEASE TRIGGER FINGER/A-1 PULLEY RIGHT THUMB (Right) - FAB  SURGEON:  Surgeon(s) and Role:    * Cindee Salt, MD - Primary  PHYSICIAN ASSISTANT:   ASSISTANTS: none   ANESTHESIA:   local and regional  EBL:  Total I/O In: 400 [I.V.:400] Out: 5 [Blood:5]  BLOOD ADMINISTERED:none  DRAINS: none   LOCAL MEDICATIONS USED:  BUPIVICAINE   SPECIMEN:  Excision  DISPOSITION OF SPECIMEN:  PATHOLOGY  COUNTS:  YES  TOURNIQUET:   Total Tourniquet Time Documented: Upper Arm (Right) - 20 minutes Total: Upper Arm (Right) - 20 minutes   DICTATION: .Other Dictation: Dictation Number 09/15/17 :53  PLAN OF CARE: Discharge to home after PACU  PATIENT DISPOSITION:  PACU - hemodynamically stable.

## 2017-09-15 NOTE — Anesthesia Procedure Notes (Signed)
Anesthesia Regional Block: Bier block (IV Regional)   Pre-Anesthetic Checklist: ,, timeout performed, Correct Patient, Correct Site, Correct Laterality, Correct Procedure,, site marked, surgical consent,, at surgeon's request Needles:  Injection technique: Single-shot  Needle Type: Other      Needle Gauge: 20     Additional Needles:   Procedures:,,,,, intact distal pulses, Esmarch exsanguination, single tourniquet utilized,  Narrative:  Injection made incrementally with aspirations every 50 mL.  Performed by: Personally

## 2017-09-15 NOTE — Transfer of Care (Signed)
Immediate Anesthesia Transfer of Care Note  Patient: Julia Kim  Procedure(s) Performed: Procedure(s) with comments: RELEASE TRIGGER FINGER/A-1 PULLEY RIGHT THUMB (Right) - FAB  Patient Location: PACU  Anesthesia Type:MAC  Level of Consciousness: awake, alert  and oriented  Airway & Oxygen Therapy: Patient Spontanous Breathing  Post-op Assessment: Report given to RN and Post -op Vital signs reviewed and stable  Post vital signs: Reviewed and stable  Last Vitals:  Vitals:   09/15/17 1346 09/15/17 1347  BP:    Pulse: (!) 51 69  Resp:  14  Temp:    SpO2: 91% 98%    Last Pain:  Vitals:   09/15/17 1211  TempSrc: Oral  PainSc: 4          Complications: No apparent anesthesia complications

## 2017-09-15 NOTE — Op Note (Signed)
Other Dictation: Dictation Number 09/15/17 :53

## 2017-09-15 NOTE — Anesthesia Postprocedure Evaluation (Signed)
Anesthesia Post Note  Patient: Julia Kim  Procedure(s) Performed: Procedure(s) (LRB): RELEASE TRIGGER FINGER/A-1 PULLEY RIGHT THUMB (Right)     Patient location during evaluation: PACU Anesthesia Type: Bier Block Level of consciousness: awake and alert Pain management: pain level controlled Vital Signs Assessment: post-procedure vital signs reviewed and stable Respiratory status: spontaneous breathing, nonlabored ventilation and respiratory function stable Cardiovascular status: stable and blood pressure returned to baseline Postop Assessment: no apparent nausea or vomiting Anesthetic complications: no    Last Vitals:  Vitals:   09/15/17 1347 09/15/17 1426  BP:  (!) 148/67  Pulse: 69 65  Resp: 14 18  Temp:  36.9 C  SpO2: 98% 95%    Last Pain:  Vitals:   09/15/17 1426  TempSrc:   PainSc: 0-No pain                 Cecile Hearing

## 2017-09-15 NOTE — H&P (Signed)
Julia Kim is an 61 y.o. female.   Chief Complaint: catching right thumb HPI: Julia Kim is referred by Dr. Swain. She states this been going on for nearly a year. She had an injection in October at the metacarpal phalangeal joint continued complaints of pain around the entire thumb to the base. She complains of pain with gripping and pinching. She complains of a mass the metacarpal phalangeal joint. She has a throbbing pain with a VAS score of 10/10 with use and no activity does not produce pain for her. She has taken Aleve which has helped. She has a history of lupus. She has no history diabetes thyroid problems or gout. Family history is negative for each of these also. States that the injection gave her temporary relief but the lump on the finger persists. This been injected on 2 occasions. She continues to complain of mild discomfort. She does have a lump present. She continues to have triggering. The pain has significantly diminished but has not entirely disappeared and is located over the metacarpal phlangeal joint.          Past Medical History:  Diagnosis Date  . Allergic sinusitis   . Allergy   . Cutaneous lupus erythematosus   . Elevated blood pressure (not hypertension)   . Flushing   . Full dentures   . GERD (gastroesophageal reflux disease) 06/29/2005   EGD ,( Dr. Patterson)  . Glaucoma   . Hypercholesterolemia   . Hyperlipidemia   . Hypertension   . Stenosing tenosynovitis of finger    R  Thumb  . Unspecified vitamin D deficiency   . Wears glasses     Past Surgical History:  Procedure Laterality Date  . CESAREAN SECTION  1975  . COLONOSCOPY    . FOOT SURGERY  2002   Right   . ULNAR NERVE TRANSPOSITION Right 09/20/2013   Procedure: RIGHT ULNAR NERVE DECOMPRESSION;  Surgeon: Gary R Kuzma, MD;  Location:  [facility];  Service: Orthopedics;  Laterality: Right;  . UPPER GASTROINTESTINAL ENDOSCOPY    . UPPER GI ENDOSCOPY      Family History   Problem Relation Age of Onset  . Heart disease Mother   . Hypertension Mother   . Kidney disease Mother   . Hypertension Father   . Stroke Maternal Grandmother   . Colon cancer Neg Hx    Social History:  reports that she quit smoking about 24 years ago. She has never used smokeless tobacco. She reports that she drinks alcohol. She reports that she does not use drugs.  Allergies:  Allergies  Allergen Reactions  . Aspirin     dyspepia with higher doses     No prescriptions prior to admission.    Results for orders placed or performed during the hospital encounter of 09/13/17 (from the past 48 hour(s))  Basic metabolic panel     Status: None   Collection Time: 09/13/17  9:25 AM  Result Value Ref Range   Sodium 140 135 - 145 mmol/L   Potassium 4.0 3.5 - 5.1 mmol/L   Chloride 106 101 - 111 mmol/L   CO2 28 22 - 32 mmol/L   Glucose, Bld 78 65 - 99 mg/dL   BUN 8 6 - 20 mg/dL   Creatinine, Ser 0.90 0.44 - 1.00 mg/dL   Calcium 9.6 8.9 - 10.3 mg/dL   GFR calc non Af Amer >60 >60 mL/min   GFR calc Af Amer >60 >60 mL/min    Comment: (NOTE)   The eGFR has been calculated using the CKD EPI equation. This calculation has not been validated in all clinical situations. eGFR's persistently <60 mL/min signify possible Chronic Kidney Disease.    Anion gap 6 5 - 15    No results found.   Pertinent items are noted in HPI.  Height 5' 5" (1.651 m), weight 62.6 kg (138 lb).  General appearance: alert, cooperative and appears stated age Head: Normocephalic, without obvious abnormality Neck: no JVD Resp: clear to auscultation bilaterally Cardio: regular rate and rhythm, S1, S2 normal, no murmur, click, rub or gallop GI: soft, non-tender; bowel sounds normal; no masses,  no organomegaly Extremities: catching right thumb Pulses: 2+ and symmetric Skin: Skin color, texture, turgor normal. No rashes or lesions Neurologic: Grossly normal Incision/Wound: na  Assessment/Plan Diagnosis  stenosing tenosynovitis right thumb.  Plan: We have discussed surgical excision of the cyst with release of the A1 pulley pre-peri-and postoperative course are discussed along with risk complications. She is where there is no guarantee to the surgery the possibility of infection recurrence injury to arteries nerves tendons complete relief symptoms dystrophy. She would like to proceed. She is scheduled for release A1 pulley right thumb is an outpatient under real regional anesthesia with probable excision cyst.         KUZMA,GARY R 09/15/2017, 9:01 AM    

## 2017-09-15 NOTE — Anesthesia Preprocedure Evaluation (Addendum)
Anesthesia Evaluation  Patient identified by MRN, date of birth, ID band Patient awake    Reviewed: Allergy & Precautions, NPO status , Patient's Chart, lab work & pertinent test results  Airway Mallampati: II  TM Distance: >3 FB Neck ROM: Full    Dental  (+) Dental Advisory Given, Partial Lower, Partial Upper   Pulmonary former smoker,    Pulmonary exam normal breath sounds clear to auscultation       Cardiovascular hypertension, Pt. on medications (-) angina(-) CAD and (-) Past MI Normal cardiovascular exam Rhythm:Regular Rate:Normal     Neuro/Psych negative neurological ROS  negative psych ROS   GI/Hepatic Neg liver ROS, GERD  Medicated,  Endo/Other  negative endocrine ROS  Renal/GU negative Renal ROS     Musculoskeletal negative musculoskeletal ROS (+)   Abdominal   Peds  Hematology negative hematology ROS (+)   Anesthesia Other Findings Day of surgery medications reviewed with the patient.  Cutaneous lupus erythematosus  Reproductive/Obstetrics                            Anesthesia Physical Anesthesia Plan  ASA: II  Anesthesia Plan: Bier Block   Post-op Pain Management:    Induction: Intravenous  PONV Risk Score and Plan: 2 and Ondansetron and Dexamethasone  Airway Management Planned: Simple Face Mask  Additional Equipment:   Intra-op Plan:   Post-operative Plan:   Informed Consent: I have reviewed the patients History and Physical, chart, labs and discussed the procedure including the risks, benefits and alternatives for the proposed anesthesia with the patient or authorized representative who has indicated his/her understanding and acceptance.   Dental advisory given  Plan Discussed with:   Anesthesia Plan Comments:         Anesthesia Quick Evaluation

## 2017-09-15 NOTE — Discharge Instructions (Signed)

## 2017-09-18 ENCOUNTER — Encounter (HOSPITAL_BASED_OUTPATIENT_CLINIC_OR_DEPARTMENT_OTHER): Payer: Self-pay | Admitting: Orthopedic Surgery

## 2017-09-25 ENCOUNTER — Encounter: Payer: Self-pay | Admitting: Physical Therapy

## 2017-09-25 ENCOUNTER — Ambulatory Visit: Payer: 59 | Attending: Family Medicine | Admitting: Physical Therapy

## 2017-09-25 DIAGNOSIS — R262 Difficulty in walking, not elsewhere classified: Secondary | ICD-10-CM | POA: Insufficient documentation

## 2017-09-25 DIAGNOSIS — M25552 Pain in left hip: Secondary | ICD-10-CM | POA: Insufficient documentation

## 2017-09-25 NOTE — Patient Instructions (Signed)
Hip JOSPT ex issued from exercise drawer 3-4 X a week 10 x each All issued

## 2017-09-25 NOTE — Therapy (Signed)
[facility] [address] [place], [place], [address] Phone: [phone]   Fax:  [phone]  Physical Therapy Treatment  Patient Details  Name: Julia Kim MRN: [id number] Date of Birth: Nov 27, 1956 Referring Provider: Tally Joe, MD  Encounter Date: 09/25/2017      PT End of Session - 09/25/17 0924    Visit Number 3   Number of Visits 13   Date for PT Re-Evaluation 10/20/17   PT Start Time 0732   PT Stop Time 0800   PT Time Calculation (min) 28 min   Activity Tolerance Patient tolerated treatment well   Behavior During Therapy [facility] for tasks assessed/performed      Past Medical History:  Diagnosis Date  . Allergic sinusitis   . Allergy   . Cutaneous lupus erythematosus   . Elevated blood pressure (not hypertension)   . Flushing   . Full dentures   . GERD (gastroesophageal reflux disease) 06/29/2005   EGD ,( Dr. Jarold Motto)  . Glaucoma   . Hypercholesterolemia   . Hyperlipidemia   . Hypertension   . Stenosing tenosynovitis of finger    R  Thumb  . Unspecified vitamin D deficiency   . Wears glasses     Past Surgical History:  Procedure Laterality Date  . CESAREAN SECTION  1975  . COLONOSCOPY    . FOOT SURGERY  2002   Right   . TRIGGER FINGER RELEASE Right 09/15/2017   Procedure: RELEASE TRIGGER FINGER/A-1 PULLEY RIGHT THUMB;  Surgeon: Cindee Salt, MD;  Location: [facility];  Service: Orthopedics;  Laterality: Right;  Bier block  . ULNAR NERVE TRANSPOSITION Right 09/20/2013   Procedure: RIGHT ULNAR NERVE DECOMPRESSION;  Surgeon: Nicki Reaper, MD;  Location: [facility];  Service: Orthopedics;  Laterality: Right;  . UPPER GASTROINTESTINAL ENDOSCOPY    . UPPER GI ENDOSCOPY      There were no vitals filed for this visit.      Subjective Assessment - 09/25/17 0728    Subjective nO PAIN.  Had a lot of walking up a hill witlh a hip catch thes It walked right on out.  had a cyst  removed from her thumb.     Currently in Pain? No/denies   Pain Location Hip   Pain Orientation Right                         [facility] Adult PT Treatment/Exercise - 09/25/17 0001      Lumbar Exercises: Quadruped   Straight Leg Raises Limitations 5 X each bent knee and straight knee lifts,  HEP ,  cued for improved technique     Knee/Hip Exercises: Stretches   Passive Hamstring Stretch Limitations seated edge of bed  3 X 30,  minor cues   Piriformis Stretch Limitations supine & seated  3 x 30 each     Knee/Hip Exercises: Standing   Functional Squat 10 reps   Functional Squat Limitations 2 steps with green band ,  HEP   Gait Training cued to increase trunk rotation with gait,  Practiced on level and 1 MPH on treadmill. rotation improved.     Knee/Hip Exercises: Supine   Hip Adduction Isometric 10 reps     Knee/Hip Exercises: Sidelying   Clams 10 x,  green band issued for exercise progression,  Not quite ready for band,  cued,  PT Education - 09/25/17 [phone]    Education provided Yes   Education Details HEP,  gait   Person(s) Educated Patient   Methods Explanation;Demonstration;Verbal cues;Handout   Comprehension Verbalized understanding;Returned demonstration             PT Long Term Goals - 09/05/17 1511      PT LONG TERM GOAL #1   Title Pt will be able to walk for at least 30 min without limitation by hip pain   Baseline 5 min at eval   Time 6   Period Weeks   Status New   Target Date 10/20/17     PT LONG TERM GOAL #2   Title FOTO to 24% limitation to indicate significant improvement in functional ability   Baseline 33% limited at eval   Time 6   Period Weeks   Status New   Target Date 10/20/17     PT LONG TERM GOAL #3   Title L hip abduction to 5/5 for proper support to lumbopelvic biomechanical chain   Baseline 4/5 at eval with pain   Time 6   Period Weeks   Status New   Target Date 10/20/17     PT LONG TERM  GOAL #4   Title Pt will demo gait pattern without trendelenburg pattern on L side to indicate appropraite activation of hip abductors during gait   Baseline pattern noted at eval resulting in pain   Time 6   Period Weeks   Status New   Target Date 10/20/17               Plan - 09/25/17 0924    Clinical Impression Statement No pain at end of session,  some soreness noted,declining need for modalities.  No limp noted today,  Noted decreased trunk rotation which improved with cues,  Right hip with slightly less ROM in gait than left. She wants to return to the gym.   PT Treatment/Interventions ADLs/Self Care Home Management;Cryotherapy;Electrical Stimulation;Iontophoresis /ml Dexamethasone;Functional mobility training;Stair training;Gait training;Ultrasound;Traction;Moist Heat;Therapeutic activities;Therapeutic exercise;Balance training;Neuromuscular re-education;Patient/family education;Passive range of motion;Manual techniques;Dry needling;Taping   PT Next Visit Plan manual or TPDN to hip abductors, abdominal engagement, glut activation.  review hip exercises.   PT Home Exercise Plan piriformis stretch-supine & seated, seated hamstring stretch, clam, post pelvic tilt   Consulted and Agree with Plan of Care Patient      Patient will benefit from skilled therapeutic intervention in order to improve the following deficits and impairments:     Visit Diagnosis: Pain in left hip  Difficulty in walking, not elsewhere classified     Problem List Patient Active Problem List   Diagnosis Date Noted  . Impingement syndrome of right shoulder 05/16/2017  . Special screening for malignant neoplasms, colon 11/28/2011  . Diverticulosis of colon (without mention of hemorrhage) 11/28/2011  . Abdominal pain 11/15/2011  . Polyp of gallbladder 11/15/2011    Hazel Leveille  PTA 09/25/2017, 9:27 AM  [facility] [address] [place],  [place], [address] Phone: [phone]   Fax:  [phone]  Name: HENNESSY BARTEL MRN: [id number] Date of Birth: 03/13/1956

## 2017-09-27 ENCOUNTER — Ambulatory Visit: Payer: 59 | Admitting: Physical Therapy

## 2017-09-27 ENCOUNTER — Encounter: Payer: Self-pay | Admitting: Physical Therapy

## 2017-09-27 DIAGNOSIS — R262 Difficulty in walking, not elsewhere classified: Secondary | ICD-10-CM | POA: Diagnosis not present

## 2017-09-27 DIAGNOSIS — M25552 Pain in left hip: Secondary | ICD-10-CM | POA: Diagnosis not present

## 2017-09-27 NOTE — Therapy (Signed)
[place], [place], [address] Phone: [phone]   Fax:  [phone]  Physical Therapy Treatment  Patient Details  Name: Julia Kim MRN: [id number] Date of Birth: 12/10/1956 Referring Provider: Antony Contras, MD  Encounter Date: 09/27/2017      PT End of Session - 09/27/17 0806    Visit Number 4   Number of Visits 13   Date for PT Re-Evaluation 10/20/17   PT Start Time 0730   PT Stop Time 0800   PT Time Calculation (min) 30 min   Activity Tolerance Patient tolerated treatment well   Behavior During Therapy [facility] for tasks assessed/performed      Past Medical History:  Diagnosis Date  . Allergic sinusitis   . Allergy   . Cutaneous lupus erythematosus   . Elevated blood pressure (not hypertension)   . Flushing   . Full dentures   . GERD (gastroesophageal reflux disease) 06/29/2005   EGD ,( Dr. Sharlett Iles)  . Glaucoma   . Hypercholesterolemia   . Hyperlipidemia   . Hypertension   . Stenosing tenosynovitis of finger    R  Thumb  . Unspecified vitamin D deficiency   . Wears glasses     Past Surgical History:  Procedure Laterality Date  . CESAREAN SECTION  1975  . COLONOSCOPY    . FOOT SURGERY  2002   Right   . TRIGGER FINGER RELEASE Right 09/15/2017   Procedure: RELEASE TRIGGER FINGER/A-1 PULLEY RIGHT THUMB;  Surgeon: Daryll Brod, MD;  Location: [place];  Service: Orthopedics;  Laterality: Right;  Bier block  . ULNAR NERVE TRANSPOSITION Right 09/20/2013   Procedure: RIGHT ULNAR NERVE DECOMPRESSION;  Surgeon: Wynonia Sours, MD;  Location: [place];  Service: Orthopedics;  Laterality: Right;  . UPPER GASTROINTESTINAL ENDOSCOPY    . UPPER GI ENDOSCOPY      There were no vitals filed for this visit.      Subjective Assessment - 09/27/17 0741    Subjective No pain . Able to walk 30 minutes at lunchtime. Hip gets a little sore after awhile then it eases  off and when I'm done it is not there.   i think the stretches are what are helping me. Not taking muscle relaxers anymore.   Currently in Pain? No/denies   Pain Location Hip   Pain Orientation Right   Pain Relieving Factors stretching            [facility] PT Assessment - 09/27/17 0001      Strength   Left Hip ABduction 4+/5                     [facility] Adult PT Treatment/Exercise - 09/27/17 0001      Lumbar Exercises: Quadruped   Straight Leg Raises Limitations 10 x BENT knee  and 10 X straight knee hip extension.  ALSO  hip abduction with 90/90 hip knee 10 X each, on elbows     Knee/Hip Exercises: Stretches   Passive Hamstring Stretch 3 reps;30 seconds  each   Piriformis Stretch 3 reps;30 seconds     Knee/Hip Exercises: Machines for Strengthening   Total Gym Leg Press 1,1.5,2 plates 10 x each   Hip Cybex 35 Lbs abd and extension 10 X each leg,  difficult     Knee/Hip Exercises: Standing   Functional Squat Limitations 5 sets of 2 steps to right and left,  no band.    SLS  with Vectors left and swinging  right,  wobbles,  fatigue vs pain     Knee/Hip Exercises: Supine   Bridges Limitations 10 reps     Knee/Hip Exercises: Sidelying   Clams 10 X green band                     PT Long Term Goals - 09/27/17 1216      PT LONG TERM GOAL #1   Title Pt will be able to walk for at least 30 min without limitation by hip pain   Baseline able   Time 6   Period Weeks   Status Achieved     PT LONG TERM GOAL #2   Title FOTO to 24% limitation to indicate significant improvement in functional ability   Time 6   Period Weeks     PT LONG TERM GOAL #3   Title L hip abduction to 5/5 for proper support to lumbopelvic biomechanical chain   Baseline 4+/5   Time 6   Period Weeks   Status Partially Met     PT LONG TERM GOAL #4   Title Pt will demo gait pattern without trendelenburg pattern on L side to indicate appropraite activation of hip abductors during  gait   Baseline improving intermitant   Time 6   Period Weeks   Status Partially Met               Plan - 09/27/17 0807    Clinical Impression Statement Patient continued to work on strengthening today.  She wants to return to the gym so we practiced with light weights.  Some soreness , brief with SLS ecercise.  better with resting.  LTG#1 met,  LTG#3, #4 partially met.MMT hip abd 4+/5.   PT Next Visit Plan manual or TPDN to hip abductors, abdominal engagement, glut activation.  hip exercises.  Continue with gym equipment she may want to try   PT Home Exercise Plan piriformis stretch-supine & seated, seated hamstring stretch, clam, post pelvic tilt.  JOSPT hip strengthening( Side steps with band and squats, quadriped onknees/elbows, leg lift straight and flexed knee,  brigde,  clam on side , green band.)   Consulted and Agree with Plan of Care Patient      Patient will benefit from skilled therapeutic intervention in order to improve the following deficits and impairments:     Visit Diagnosis: Pain in left hip  Difficulty in walking, not elsewhere classified     Problem List Patient Active Problem List   Diagnosis Date Noted  . Impingement syndrome of right shoulder 05/16/2017  . Special screening for malignant neoplasms, colon 11/28/2011  . Diverticulosis of colon (without mention of hemorrhage) 11/28/2011  . Abdominal pain 11/15/2011  . Polyp of gallbladder 11/15/2011    HARRIS,KAREN PTA 09/27/2017, 12:22 PM  [facility] [address] [place], [place], [address] Phone: [phone]   Fax:  [phone]  Name: Julia Kim MRN: [id number] Date of Birth: 14-Mar-1956

## 2017-10-02 ENCOUNTER — Ambulatory Visit: Payer: 59 | Admitting: Physical Therapy

## 2017-10-02 ENCOUNTER — Encounter: Payer: Self-pay | Admitting: Physical Therapy

## 2017-10-02 DIAGNOSIS — M25552 Pain in left hip: Secondary | ICD-10-CM | POA: Diagnosis not present

## 2017-10-02 DIAGNOSIS — R262 Difficulty in walking, not elsewhere classified: Secondary | ICD-10-CM

## 2017-10-02 NOTE — Therapy (Signed)
[place], [place], [address] Phone: [phone]   Fax:  [phone]  Physical Therapy Treatment  Patient Details  Name: Julia Kim MRN: [id number] Date of Birth: [birth year] Referring Provider: Antony Contras, MD  Encounter Date: 10/02/2017    Past Medical History:  Diagnosis Date  . Allergic sinusitis   . Allergy   . Cutaneous lupus erythematosus   . Elevated blood pressure (not hypertension)   . Flushing   . Full dentures   . GERD (gastroesophageal reflux disease) 06/29/2005   EGD ,( Dr. Sharlett Iles)  . Glaucoma   . Hypercholesterolemia   . Hyperlipidemia   . Hypertension   . Stenosing tenosynovitis of finger    R  Thumb  . Unspecified vitamin D deficiency   . Wears glasses     Past Surgical History:  Procedure Laterality Date  . CESAREAN SECTION  1975  . COLONOSCOPY    . FOOT SURGERY  2002   Right   . TRIGGER FINGER RELEASE Right 09/15/2017   Procedure: RELEASE TRIGGER FINGER/A-1 PULLEY RIGHT THUMB;  Surgeon: Daryll Brod, MD;  Location: [place];  Service: Orthopedics;  Laterality: Right;  Bier block  . ULNAR NERVE TRANSPOSITION Right 09/20/2013   Procedure: RIGHT ULNAR NERVE DECOMPRESSION;  Surgeon: Wynonia Sours, MD;  Location: [place];  Service: Orthopedics;  Laterality: Right;  . UPPER GASTROINTESTINAL ENDOSCOPY    . UPPER GI ENDOSCOPY      There were no vitals filed for this visit.      Subjective Assessment - 10/02/17 0807    Subjective No pain.    Currently in Pain? No/denies   Pain Location Hip   Pain Orientation Right   Pain Relieving Factors Stretching                         [facility] Adult PT Treatment/Exercise - 10/02/17 0001      Lumbar Exercises: Standing   Row 10 reps  2 sets 10 LBS cable cross   Row Limitations Protraction 10 LBS 10 X left only dueto having right hand pain     Knee/Hip Exercises: Stretches   Passive Hamstring Stretch 3 reps;30 seconds  each     Knee/Hip Exercises: Aerobic   Stationary Bike 5 minutes L2     Knee/Hip Exercises: Machines for Strengthening   Cybex Knee Flexion 25. X10,  35Lbs X 10 x2 ,  cued technique   Hip Cybex 25 LBS 20 Xfor extension,,  20 x abduction 25 Lbs eachneeded cued to keep standing leg bent  difficult  moderate cues initially     Knee/Hip Exercises: Seated   Sit to Sand 10 reps     Knee/Hip Exercises: Supine   Bridges Limitations 10 rep                     PT Long Term Goals - 10/02/17 0809      PT LONG TERM GOAL #1   Title Pt will be able to walk for at least 30 min without limitation by hip pain   Time 6   Period Weeks   Status Achieved     PT LONG TERM GOAL #2   Title FOTO to 24% limitation to indicate significant improvement in functional ability   Time 6   Status Unable to assess     PT LONG TERM GOAL #3   Title L hip abduction to 5/5  for proper support to lumbopelvic biomechanical chain   Baseline 4+/5   Time 6   Period Weeks   Status Partially Met     PT LONG TERM GOAL #4   Title Pt will demo gait pattern without trendelenburg pattern on L side to indicate appropraite activation of hip abductors during gait   Baseline improving intermitant   Time 6   Period Weeks   Status Partially Met             Patient will benefit from skilled therapeutic intervention in order to improve the following deficits and impairments:     Visit Diagnosis: Pain in left hip  Difficulty in walking, not elsewhere classified     Problem List Patient Active Problem List   Diagnosis Date Noted  . Impingement syndrome of right shoulder 05/16/2017  . Special screening for malignant neoplasms, colon 11/28/2011  . Diverticulosis of colon (without mention of hemorrhage) 11/28/2011  . Abdominal pain 11/15/2011  . Polyp of gallbladder 11/15/2011    [employer] PTA 10/02/2017, 8:13 AM  [facility] [address] [place], [place], [address] Phone: [phone]   Fax:  [phone]  Name: DUAA STELZNER MRN: [id number] Date of Birth: [birth year]

## 2017-10-04 ENCOUNTER — Ambulatory Visit: Payer: 59 | Admitting: Physical Therapy

## 2017-10-04 DIAGNOSIS — R262 Difficulty in walking, not elsewhere classified: Secondary | ICD-10-CM | POA: Diagnosis not present

## 2017-10-04 DIAGNOSIS — M25552 Pain in left hip: Secondary | ICD-10-CM | POA: Diagnosis not present

## 2017-10-04 NOTE — Therapy (Signed)
[place], [place], [address] Phone: [phone]   Fax:  [phone]  Physical Therapy Treatment  Patient Details  Name: Julia Kim MRN: [id number] Date of Birth: 07/24/1956 Referring Provider: Antony Contras, MD  Encounter Date: 10/04/2017      PT End of Session - 10/04/17 1712    Visit Number 5   Number of Visits 13   Date for PT Re-Evaluation 10/20/17   PT Start Time 1628  short session today.  Patient request.  Routine of exercises completed at a faster pace today.   PT Stop Time 1700   PT Time Calculation (min) 32 min   Activity Tolerance Patient tolerated treatment well   Behavior During Therapy [facility] for tasks assessed/performed      Past Medical History:  Diagnosis Date  . Allergic sinusitis   . Allergy   . Cutaneous lupus erythematosus   . Elevated blood pressure (not hypertension)   . Flushing   . Full dentures   . GERD (gastroesophageal reflux disease) 06/29/2005   EGD ,( Dr. Sharlett Iles)  . Glaucoma   . Hypercholesterolemia   . Hyperlipidemia   . Hypertension   . Stenosing tenosynovitis of finger    R  Thumb  . Unspecified vitamin D deficiency   . Wears glasses     Past Surgical History:  Procedure Laterality Date  . CESAREAN SECTION  1975  . COLONOSCOPY    . FOOT SURGERY  2002   Right   . TRIGGER FINGER RELEASE Right 09/15/2017   Procedure: RELEASE TRIGGER FINGER/A-1 PULLEY RIGHT THUMB;  Surgeon: Daryll Brod, MD;  Location: [place];  Service: Orthopedics;  Laterality: Right;  Bier block  . ULNAR NERVE TRANSPOSITION Right 09/20/2013   Procedure: RIGHT ULNAR NERVE DECOMPRESSION;  Surgeon: Wynonia Sours, MD;  Location: [place];  Service: Orthopedics;  Laterality: Right;  . UPPER GASTROINTESTINAL ENDOSCOPY    . UPPER GI ENDOSCOPY      There were no vitals filed for this visit.                       [facility] Adult PT  Treatment/Exercise - 10/04/17 0001      Ambulation/Gait   Gait Comments no trendelenburg     Lumbar Exercises: Standing   Row 10 reps   Row Limitations 10 LBS both hands, 20 x  Pritraction 1 hand only 20 X, 10 LBs     Knee/Hip Exercises: Stretches   Passive Hamstring Stretch 3 reps;30 seconds   Hip Flexor Stretch 3 reps;20 seconds   Hip Flexor Stretch Limitations standing post treadmill to decrease hip tightness,    Other Knee/Hip Stretches Hip IR stretch 3 x 30,  ER 1 X 20 seconds.      Knee/Hip Exercises: Aerobic   Tread Mill 5 minutes up to 3MPH,  No PAIN.  Lt anterior hip Tight after.  relieved with      Knee/Hip Exercises: Machines for Strengthening   Cybex Knee Flexion 25 x 2, both   Total Gym Leg Press 1,2,3  10 x each   Hip Cybex 10 x 2 sets 25, each  extension 25 Lbs 10 x 2 sets each ,difficult     Knee/Hip Exercises: Supine   Bridges Limitations 10 rep   Bridges with [employer] 10 reps                     PT  Long Term Goals - 10/02/17 0809      PT LONG TERM GOAL #1   Title Pt will be able to walk for at least 30 min without limitation by hip pain   Time 6   Period Weeks   Status Achieved     PT LONG TERM GOAL #2   Title FOTO to 24% limitation to indicate significant improvement in functional ability   Time 6   Status Unable to assess     PT LONG TERM GOAL #3   Title L hip abduction to 5/5 for proper support to lumbopelvic biomechanical chain   Baseline 4+/5   Time 6   Period Weeks   Status Partially Met     PT LONG TERM GOAL #4   Title Pt will demo gait pattern without trendelenburg pattern on L side to indicate appropraite activation of hip abductors during gait   Baseline improving intermitant   Time 6   Period Weeks   Status Partially Met               Plan - 10/04/17 1713    Clinical Impression Statement Gym routine continued today with hip tightness after treadmill.  Stretches in standing relieved tightness.  She has  had no pain since last visit.  She was able to power walk during her lunch  break without pain.      PT Treatment/Interventions ADLs/Self Care Home Management;Cryotherapy;Electrical Stimulation;Iontophoresis 7m/ml Dexamethasone;Functional mobility training;Stair training;Gait training;Ultrasound;Traction;Moist Heat;Therapeutic activities;Therapeutic exercise;Balance training;Neuromuscular re-education;Patient/family education;Passive range of motion;Manual techniques;Dry needling;Taping   PT Next Visit Plan manual or TPDN to hip abductors, abdominal engagement, glut activation.  hip exercises.  Continue with gym equipment she may want to try   PT Home Exercise Plan piriformis stretch-supine & seated, seated hamstring stretch, clam, post pelvic tilt.  JOSPT hip strengthening( Side steps with band and squats, quadriped onknees/elbows, leg lift straight and flexed knee,  brigde,  clam on side , green band.)   Consulted and Agree with Plan of Care Patient      Patient will benefit from skilled therapeutic intervention in order to improve the following deficits and impairments:  Abnormal gait, Difficulty walking, Increased muscle spasms, Decreased activity tolerance, Pain, Improper body mechanics, Impaired flexibility, Decreased strength, Postural dysfunction  Visit Diagnosis: Pain in left hip  Difficulty in walking, not elsewhere classified     Problem List Patient Active Problem List   Diagnosis Date Noted  . Impingement syndrome of right shoulder 05/16/2017  . Special screening for malignant neoplasms, colon 11/28/2011  . Diverticulosis of colon (without mention of hemorrhage) 11/28/2011  . Abdominal pain 11/15/2011  . Polyp of gallbladder 11/15/2011    Julia Kim PTA 10/04/2017, 5:17 PM  [facility] [address] [place] [address]Phone: [phone]  Fax:  [phone] Name: Julia MYHANDMRN: [id number] of  Birth: [birth year]

## 2017-10-10 ENCOUNTER — Encounter: Payer: Self-pay | Admitting: Physical Therapy

## 2017-10-10 ENCOUNTER — Ambulatory Visit: Payer: 59 | Admitting: Physical Therapy

## 2017-10-10 DIAGNOSIS — R262 Difficulty in walking, not elsewhere classified: Secondary | ICD-10-CM | POA: Diagnosis not present

## 2017-10-10 DIAGNOSIS — M25552 Pain in left hip: Secondary | ICD-10-CM

## 2017-10-10 NOTE — Patient Instructions (Signed)
Hip Flexors (Supine)    Lie with both legs bent over edge of firm surface. To stretch left hip, bring opposite knee to chest. . Do not allow hips to roll up. Do not let knees change position. Hold __30__ seconds. Repeat __3__ times. Do ___1_ sessions per day. CAUTION: Stretch should be gentle, steady and slow.  Hang onlt  Copyright  VHI. All rights reserved.

## 2017-10-10 NOTE — Therapy (Signed)
[place], [place], [address] Phone: [phone]   Fax:  [phone]  Physical Therapy Treatment  Patient Details  Name: Julia Kim MRN: [id number] Date of Birth: [birth year] Referring Provider: Antony Contras, MD  Encounter Date: 10/10/2017      PT End of Session - 10/10/17 0828    Visit Number 5   Number of Visits 13   PT Start Time 0730   PT Stop Time 0802   PT Time Calculation (min) 32 min   Activity Tolerance Patient tolerated treatment well   Behavior During Therapy [facility] for tasks assessed/performed      Past Medical History:  Diagnosis Date  . Allergic sinusitis   . Allergy   . Cutaneous lupus erythematosus   . Elevated blood pressure (not hypertension)   . Flushing   . Full dentures   . GERD (gastroesophageal reflux disease) 06/29/2005   EGD ,( Dr. Sharlett Iles)  . Glaucoma   . Hypercholesterolemia   . Hyperlipidemia   . Hypertension   . Stenosing tenosynovitis of finger    R  Thumb  . Unspecified vitamin D deficiency   . Wears glasses     Past Surgical History:  Procedure Laterality Date  . CESAREAN SECTION  1975  . COLONOSCOPY    . FOOT SURGERY  2002   Right   . TRIGGER FINGER RELEASE Right 09/15/2017   Procedure: RELEASE TRIGGER FINGER/A-1 PULLEY RIGHT THUMB;  Surgeon: Daryll Brod, MD;  Location: [place];  Service: Orthopedics;  Laterality: Right;  Bier block  . ULNAR NERVE TRANSPOSITION Right 09/20/2013   Procedure: RIGHT ULNAR NERVE DECOMPRESSION;  Surgeon: Wynonia Sours, MD;  Location: [place];  Service: Orthopedics;  Laterality: Right;  . UPPER GASTROINTESTINAL ENDOSCOPY    . UPPER GI ENDOSCOPY      There were no vitals filed for this visit.      Subjective Assessment - 10/10/17 0735    Subjective No pain.  has been doing the execises   Currently in Pain? No/denies   Pain Location Hip   Pain Orientation Right   Pain Descriptors  / Indicators Aching   Aggravating Factors  longer walking   Pain Relieving Factors stretchug                         [facility] Adult PT Treatment/Exercise - 10/10/17 0001      Lumbar Exercises: Quadruped   Straight Leg Raises Limitations 10 x BENT knee  and 10 X straight knee hip extension.  ALSO  hip abduction with 90/90 hip knee 10 X each,    Plank knee lift 10 X     Knee/Hip Exercises: Aerobic   Recumbent Bike 5 minutes L2     Knee/Hip Exercises: Sidelying   Clams 10 X green band                PT Education - 10/10/17 0828    Education provided Yes   Education Details HEP   Person(s) Educated Patient   Methods Explanation;Tactile cues;Verbal cues;Handout   Comprehension Verbalized understanding;Returned demonstration             PT Long Term Goals - 10/02/17 0809      PT LONG TERM GOAL #1   Title Pt will be able to walk for at least 30 min without limitation by hip pain   Time 6   Period Weeks   Status Achieved  PT LONG TERM GOAL #2   Title FOTO to 24% limitation to indicate significant improvement in functional ability   Time 6   Status Unable to assess     PT LONG TERM GOAL #3   Title L hip abduction to 5/5 for proper support to lumbopelvic biomechanical chain   Baseline 4+/5   Time 6   Period Weeks   Status Partially Met     PT LONG TERM GOAL #4   Title Pt will demo gait pattern without trendelenburg pattern on L side to indicate appropraite activation of hip abductors during gait   Baseline improving intermitant   Time 6   Period Weeks   Status Partially Met               Plan - 10/10/17 7517    Clinical Impression Statement Anterior hip tight bilateral.  Tissue lengthened post stretching.  Strengthening focus.  No pain at end of session.  Progress toward HEP goals.   PT Treatment/Interventions ADLs/Self Care Home Management;Cryotherapy;Electrical Stimulation;Iontophoresis 11m/ml Dexamethasone;Functional mobility  training;Stair training;Gait training;Ultrasound;Traction;Moist Heat;Therapeutic activities;Therapeutic exercise;Balance training;Neuromuscular re-education;Patient/family education;Passive range of motion;Manual techniques;Dry needling;Taping   PT Next Visit Plan Review Hip flexor stretch, abdominal engagement, glut activation.  hip exercises.  Continue with gym equipment she may want to try   PT Home Exercise Plan piriformis stretch-supine & seated, seated hamstring stretch, clam, post pelvic tilt.  JOSPT hip strengthening( Side steps with band and squats, quadriped onknees/elbows, leg lift straight and flexed knee,  brigde,  clam on side , green band.) hip flex stretch   Consulted and Agree with Plan of Care Patient      Patient will benefit from skilled therapeutic intervention in order to improve the following deficits and impairments:     Visit Diagnosis: Pain in left hip  Difficulty in walking, not elsewhere classified     Problem List Patient Active Problem List   Diagnosis Date Noted  . Impingement syndrome of right shoulder 05/16/2017  . Special screening for malignant neoplasms, colon 11/28/2011  . Diverticulosis of colon (without mention of hemorrhage) 11/28/2011  . Abdominal pain 11/15/2011  . Polyp of gallbladder 11/15/2011    Dionel Archey PTA 10/10/2017, 8:37 AM  [facility]GB[address] [address]Phone: [phone]  Fax:  [phone] Name: Julia TENNYMRN: [id number] of Birth: [birth year]

## 2017-10-12 ENCOUNTER — Encounter: Payer: Self-pay | Admitting: Physical Therapy

## 2017-10-12 ENCOUNTER — Ambulatory Visit: Payer: 59 | Admitting: Physical Therapy

## 2017-10-12 DIAGNOSIS — R262 Difficulty in walking, not elsewhere classified: Secondary | ICD-10-CM | POA: Diagnosis not present

## 2017-10-12 DIAGNOSIS — M25552 Pain in left hip: Secondary | ICD-10-CM

## 2017-10-12 NOTE — Therapy (Signed)
[place], [place], [address] Phone: [phone]   Fax:  [phone]  Physical Therapy Treatment  Patient Details  Name: Julia Kim MRN: [id number] Date of Birth: 1956/03/05 Referring Provider: Antony Contras, MD  Encounter Date: 10/12/2017      PT End of Session - 10/12/17 0812    Visit Number 6   Number of Visits 13   Date for PT Re-Evaluation 10/20/17   PT Start Time 0731   PT Stop Time 0755   PT Time Calculation (min) 24 min   Activity Tolerance Patient tolerated treatment well   Behavior During Therapy [facility] for tasks assessed/performed      Past Medical History:  Diagnosis Date  . Allergic sinusitis   . Allergy   . Cutaneous lupus erythematosus   . Elevated blood pressure (not hypertension)   . Flushing   . Full dentures   . GERD (gastroesophageal reflux disease) 06/29/2005   EGD ,( Dr. Sharlett Iles)  . Glaucoma   . Hypercholesterolemia   . Hyperlipidemia   . Hypertension   . Stenosing tenosynovitis of finger    R  Thumb  . Unspecified vitamin D deficiency   . Wears glasses     Past Surgical History:  Procedure Laterality Date  . CESAREAN SECTION  1975  . COLONOSCOPY    . FOOT SURGERY  2002   Right   . TRIGGER FINGER RELEASE Right 09/15/2017   Procedure: RELEASE TRIGGER FINGER/A-1 PULLEY RIGHT THUMB;  Surgeon: Daryll Brod, MD;  Location: [place];  Service: Orthopedics;  Laterality: Right;  Bier block  . ULNAR NERVE TRANSPOSITION Right 09/20/2013   Procedure: RIGHT ULNAR NERVE DECOMPRESSION;  Surgeon: Wynonia Sours, MD;  Location: [place];  Service: Orthopedics;  Laterality: Right;  . UPPER GASTROINTESTINAL ENDOSCOPY    . UPPER GI ENDOSCOPY      There were no vitals filed for this visit.      Subjective Assessment - 10/12/17 0804    Subjective No pain.  She has been working on the new stretches.  She is still able to walk at work without  pain.   Currently in Pain? No/denies   Pain Location Hip   Pain Orientation Right   Pain Relieving Factors stretching                         [facility] Adult PT Treatment/Exercise - 10/12/17 0001      Knee/Hip Exercises: Stretches   Loss adjuster, chartered Limitations off mat edge with manual to quads  each   Hip Flexor Stretch 5 reps;30 seconds   Hip Flexor Stretch Limitations off edge of mat,  each     Knee/Hip Exercises: Aerobic   Elliptical 4 minutes Ramp 8,  L1     Knee/Hip Exercises: Standing   Forward Lunges 10 reps  each side to [employer].  cued initially, 1 hand on counter.      Knee/Hip Exercises: Supine   Bridges with Ball Squeeze 10 reps   Single Leg Bridge 1 set;10 reps  each,  small height off mat,  cued breathing     Knee/Hip Exercises: Prone   Hamstring Curl Limitations AROM   Hip Extension PROM;AROM   Hip Extension Limitations improving                     PT Long Term Goals - 10/12/17 2334  PT LONG TERM GOAL #1   Title Pt will be able to walk for at least 30 min without limitation by hip pain   Time 6   Period Weeks   Status Achieved     PT LONG TERM GOAL #2   Title FOTO to 24% limitation to indicate significant improvement in functional ability   Time 6   Period Weeks   Status Unable to assess     PT LONG TERM GOAL #3   Title L hip abduction to 5/5 for proper support to lumbopelvic biomechanical chain   Time 6   Period Weeks   Status Unable to assess     PT LONG TERM GOAL #4   Title Pt will demo gait pattern without trendelenburg pattern on L side to indicate appropraite activation of hip abductors during gait   Baseline No limp   Time 6   Period Weeks   Status Achieved               Plan - 10/12/17 0813    Clinical Impression Statement Hip ROM improving in extension,  Improved 50%. No pain with ADL,  Walking. LTG#4 met.   PT Treatment/Interventions ADLs/Self Care Home  Management;Cryotherapy;Electrical Stimulation;Iontophoresis 33m/ml Dexamethasone;Functional mobility training;Stair training;Gait training;Ultrasound;Traction;Moist Heat;Therapeutic activities;Therapeutic exercise;Balance training;Neuromuscular re-education;Patient/family education;Passive range of motion;Manual techniques;Dry needling;Taping   PT Next Visit Plan , abdominal engagement, glut activation.  hip exercises.  Continue with gym equipment she may want to try,  FMerdis Delay  [place] goals.    PT Home Exercise Plan piriformis stretch-supine & seated, seated hamstring stretch, clam, post pelvic tilt.  JOSPT hip strengthening( Side steps with band and squats, quadriped onknees/elbows, leg lift straight and flexed knee,  brigde,  clam on side , green band.) hip flex stretch   Consulted and Agree with Plan of Care Patient      Patient will benefit from skilled therapeutic intervention in order to improve the following deficits and impairments:     Visit Diagnosis: Pain in left hip  Difficulty in walking, not elsewhere classified     Problem List Patient Active Problem List   Diagnosis Date Noted  . Impingement syndrome of right shoulder 05/16/2017  . Special screening for malignant neoplasms, colon 11/28/2011  . Diverticulosis of colon (without mention of hemorrhage) 11/28/2011  . Abdominal pain 11/15/2011  . Polyp of gallbladder 11/15/2011    HARRIS,KAREN PTA 10/12/2017, 8:16 AM  [place] [address]Phone: [phone]  Fax:  [phone] Name: Julia HAMLETTMRN: [id number] of Birth: 311/16/1957

## 2017-10-16 ENCOUNTER — Ambulatory Visit: Payer: 59 | Admitting: Physical Therapy

## 2017-10-16 ENCOUNTER — Encounter: Payer: Self-pay | Admitting: Physical Therapy

## 2017-10-16 DIAGNOSIS — R262 Difficulty in walking, not elsewhere classified: Secondary | ICD-10-CM

## 2017-10-16 DIAGNOSIS — M25552 Pain in left hip: Secondary | ICD-10-CM | POA: Diagnosis not present

## 2017-10-16 NOTE — Therapy (Signed)
[place], [place], [address] Phone: [phone]   Fax:  [phone]  Physical Therapy Treatment  Patient Details  Name: Julia Kim MRN: [id number] Date of Birth: 1956/10/16 Referring Provider: Antony Contras, MD  Encounter Date: 10/16/2017      PT End of Session - 10/16/17 1721    Visit Number 7   Number of Visits 13   Date for PT Re-Evaluation 10/20/17   PT Start Time 0731   PT Stop Time 0800   PT Time Calculation (min) 29 min   Activity Tolerance Patient tolerated treatment well   Behavior During Therapy [facility] for tasks assessed/performed      Past Medical History:  Diagnosis Date  . Allergic sinusitis   . Allergy   . Cutaneous lupus erythematosus   . Elevated blood pressure (not hypertension)   . Flushing   . Full dentures   . GERD (gastroesophageal reflux disease) 06/29/2005   EGD ,( Dr. Sharlett Iles)  . Glaucoma   . Hypercholesterolemia   . Hyperlipidemia   . Hypertension   . Stenosing tenosynovitis of finger    R  Thumb  . Unspecified vitamin D deficiency   . Wears glasses     Past Surgical History:  Procedure Laterality Date  . CESAREAN SECTION  1975  . COLONOSCOPY    . FOOT SURGERY  2002   Right   . TRIGGER FINGER RELEASE Right 09/15/2017   Procedure: RELEASE TRIGGER FINGER/A-1 PULLEY RIGHT THUMB;  Surgeon: Daryll Brod, MD;  Location: [place];  Service: Orthopedics;  Laterality: Right;  Bier block  . ULNAR NERVE TRANSPOSITION Right 09/20/2013   Procedure: RIGHT ULNAR NERVE DECOMPRESSION;  Surgeon: Wynonia Sours, MD;  Location: [place];  Service: Orthopedics;  Laterality: Right;  . UPPER GASTROINTESTINAL ENDOSCOPY    . UPPER GI ENDOSCOPY      There were no vitals filed for this visit.      Subjective Assessment - 10/16/17 1717    Subjective No pain.  She has been doing the HEP.  She is walking 30 minutes without pain.   Currently in  Pain? No/denies   Pain Location Hip   Pain Orientation Right                         [facility] Adult PT Treatment/Exercise - 10/16/17 0001      Lumbar Exercises: Quadruped   Straight Leg Raises Limitations 10 x BENT knee  and 10 X straight knee hip extension.  ALSO  hip abduction with 90/90 hip knee 10 X each,    Opposite Arm/Leg Raise 10 reps     Knee/Hip Exercises: Stretches   Loss adjuster, chartered Limitations off mat edge with manual to quads  each   Hip Flexor Stretch 5 reps;30 seconds   Hip Flexor Stretch Limitations off edge of mat,  each   Other Knee/Hip Stretches Hip ER stretch 2 reps 10 seconds     Knee/Hip Exercises: Aerobic   Recumbent Bike  5 minutes, 1.3 miles.     Knee/Hip Exercises: Machines for Strengthening   Cybex Leg Press 10 X 2 sets,  both 2 plates,  and single leg 1 plate 10 x each.     Knee/Hip Exercises: Standing   Hip Flexion 10 reps  green band   Forward Lunges --  green band each   Hip Abduction 10 reps  green  Hip Extension 10 reps   Extension Limitations green bands   Functional Squat Limitations 5 sets of 2 steps to right and left,  green band                     PT Long Term Goals - 10/16/17 1721      PT LONG TERM GOAL #1   Title Pt will be able to walk for at least 30 min without limitation by hip pain   Baseline able   Time 6   Period Weeks   Status Achieved     PT LONG TERM GOAL #2   Title FOTO to 24% limitation to indicate significant improvement in functional ability   Time 6   Period Weeks   Status Unable to assess     PT LONG TERM GOAL #3   Title L hip abduction to 5/5 for proper support to lumbopelvic biomechanical chain   Baseline 4+/5 Slightly weaker than right.    Time 6   Period Weeks   Status On-going     PT LONG TERM GOAL #4   Title Pt will demo gait pattern without trendelenburg pattern on L side to indicate appropraite activation of hip abductors during gait   Baseline  no trendelenburg   Time 6   Period Weeks   Status Achieved               Plan - 10/16/17 1723    Clinical Impression Statement No pain with exercise.  Hip abduction MMT left 4+/5.     PT Treatment/Interventions ADLs/Self Care Home Management;Cryotherapy;Electrical Stimulation;Iontophoresis 38m/ml Dexamethasone;Functional mobility training;Stair training;Gait training;Ultrasound;Traction;Moist Heat;Therapeutic activities;Therapeutic exercise;Balance training;Neuromuscular re-education;Patient/family education;Passive range of motion;Manual techniques;Dry needling;Taping   PT Next Visit Plan , abdominal engagement, glut activation.  hip exercises.  Continue with gym equipment she may want to try,  [place] HEP,  ERO.  She has met most of her goals..    PT Home Exercise Plan piriformis stretch-supine & seated, seated hamstring stretch, clam, post pelvic tilt.  JOSPT hip strengthening( Side steps with band and squats, quadriped onknees/elbows, leg lift straight and flexed knee,  brigde,  clam on side , green band.) hip flex stretch   Consulted and Agree with Plan of Care Patient      Patient will benefit from skilled therapeutic intervention in order to improve the following deficits and impairments:     Visit Diagnosis: Pain in left hip  Difficulty in walking, not elsewhere classified     Problem List Patient Active Problem List   Diagnosis Date Noted  . Impingement syndrome of right shoulder 05/16/2017  . Special screening for malignant neoplasms, colon 11/28/2011  . Diverticulosis of colon (without mention of hemorrhage) 11/28/2011  . Abdominal pain 11/15/2011  . Polyp of gallbladder 11/15/2011    Quatisha Zylka PTA 10/16/2017, 5:25 PM  [facility] [address]Phone: [phone]  Fax:  [phone] Name: Julia PIGEONMRN: [id number] of Birth: [birth year]

## 2017-10-18 ENCOUNTER — Encounter: Payer: Self-pay | Admitting: Physical Therapy

## 2017-10-18 ENCOUNTER — Ambulatory Visit: Payer: 59 | Admitting: Physical Therapy

## 2017-10-18 DIAGNOSIS — M25552 Pain in left hip: Secondary | ICD-10-CM

## 2017-10-18 DIAGNOSIS — R262 Difficulty in walking, not elsewhere classified: Secondary | ICD-10-CM
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
## 2017-10-23 ENCOUNTER — Ambulatory Visit: Payer: 59 | Attending: Family Medicine | Admitting: Physical Therapy

## 2017-10-25 ENCOUNTER — Ambulatory Visit: Payer: 59 | Admitting: Physical Therapy

## 2017-10-30 DIAGNOSIS — M25512 Pain in left shoulder: Secondary | ICD-10-CM | POA: Diagnosis not present

## 2017-10-30 DIAGNOSIS — M542 Cervicalgia: Secondary | ICD-10-CM | POA: Diagnosis not present

## 2017-12-15 ENCOUNTER — Other Ambulatory Visit (HOSPITAL_COMMUNITY): Payer: Self-pay | Admitting: Family Medicine

## 2017-12-15 DIAGNOSIS — H524 Presbyopia: Secondary | ICD-10-CM | POA: Diagnosis not present

## 2017-12-15 DIAGNOSIS — M5412 Radiculopathy, cervical region: Secondary | ICD-10-CM | POA: Diagnosis not present

## 2017-12-15 DIAGNOSIS — I10 Essential (primary) hypertension: Secondary | ICD-10-CM | POA: Diagnosis not present

## 2017-12-17 ENCOUNTER — Ambulatory Visit (HOSPITAL_COMMUNITY)
Admission: RE | Admit: 2017-12-17 | Discharge: 2017-12-17 | Disposition: A | Discharge status: Home / Self Care | Payer: 59 | Source: Ambulatory Visit | Attending: Family Medicine | Admitting: Family Medicine

## 2017-12-17 DIAGNOSIS — M5412 Radiculopathy, cervical region: Secondary | ICD-10-CM | POA: Diagnosis present

## 2017-12-17 DIAGNOSIS — M4722 Other spondylosis with radiculopathy, cervical region: Secondary | ICD-10-CM | POA: Insufficient documentation

## 2017-12-17 DIAGNOSIS — M5011 Cervical disc disorder with radiculopathy,  high cervical region: Secondary | ICD-10-CM | POA: Diagnosis not present

## 2017-12-17 DIAGNOSIS — M4802 Spinal stenosis, cervical region: Secondary | ICD-10-CM | POA: Insufficient documentation

## 2017-12-18 DIAGNOSIS — H401131 Primary open-angle glaucoma, bilateral, mild stage: Secondary | ICD-10-CM | POA: Diagnosis not present

## 2017-12-21 MED FILL — SPIRONOLACTONE 25 MG TABLET: 25 | 90 days supply | Qty: 90 | Fill #0

## 2017-12-21 MED FILL — SIMBRINZA 1%-0.2% EYE DROPS: 1-0.2 | 80 days supply | Qty: 16 | Fill #0

## 2017-12-21 MED FILL — SIMVASTATIN 40 MG TABLET: 40 | 90 days supply | Qty: 90 | Fill #0

## 2017-12-21 MED FILL — TRAVATAN Z 0.004% EYE DROP: 0.004 | 90 days supply | Qty: 8 | Fill #0

## 2017-12-21 MED FILL — VIT D2 1.25 MG (50,000 UNIT: 1.25 MG | 84 days supply | Qty: 12 | Fill #0

## 2018-01-15 DIAGNOSIS — M1812 Unilateral primary osteoarthritis of first carpometacarpal joint, left hand: Secondary | ICD-10-CM | POA: Diagnosis not present

## 2018-01-15 MED FILL — DICLOFENAC SODIUM 1% GEL: 1 | 12 days supply | Qty: 100 | Fill #0

## 2018-01-15 MED FILL — HYDROCODON-APAP 5-325: 5-325 | 5 days supply | Qty: 20 | Fill #0

## 2018-01-17 ENCOUNTER — Ambulatory Visit
Admission: RE | Admit: 2018-01-17 | Discharge: 2018-01-17 | Disposition: A | Discharge status: Home / Self Care | Payer: 59 | Source: Ambulatory Visit | Attending: Family Medicine | Admitting: Family Medicine

## 2018-01-17 ENCOUNTER — Other Ambulatory Visit: Payer: Self-pay | Admitting: Family Medicine

## 2018-01-17 DIAGNOSIS — M1812 Unilateral primary osteoarthritis of first carpometacarpal joint, left hand: Secondary | ICD-10-CM

## 2018-01-17 DIAGNOSIS — M79645 Pain in left finger(s): Secondary | ICD-10-CM | POA: Diagnosis not present

## 2018-01-31 DIAGNOSIS — N390 Urinary tract infection, site not specified: Secondary | ICD-10-CM | POA: Diagnosis not present

## 2018-01-31 DIAGNOSIS — M5412 Radiculopathy, cervical region: Secondary | ICD-10-CM | POA: Diagnosis not present

## 2018-01-31 DIAGNOSIS — M5030 Other cervical disc degeneration, unspecified cervical region: Secondary | ICD-10-CM | POA: Diagnosis not present

## 2018-01-31 DIAGNOSIS — R1030 Lower abdominal pain, unspecified: Secondary | ICD-10-CM | POA: Diagnosis not present

## 2018-01-31 MED FILL — predniSONE 10 MG (21) TBPK: 10 | 6 days supply | Qty: 21 | Fill #0

## 2018-02-01 MED FILL — NITROFURANTOIN MONO-MCR 100: 100 | 5 days supply | Qty: 10 | Fill #0
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
## 2018-02-22 ENCOUNTER — Encounter: Payer: Self-pay | Admitting: Physical Therapy

## 2018-02-22 ENCOUNTER — Ambulatory Visit: Payer: 59 | Attending: Physical Medicine and Rehabilitation | Admitting: Physical Therapy

## 2018-02-22 DIAGNOSIS — M542 Cervicalgia: Secondary | ICD-10-CM | POA: Insufficient documentation

## 2018-02-22 DIAGNOSIS — R293 Abnormal posture: Secondary | ICD-10-CM | POA: Insufficient documentation

## 2018-02-22 NOTE — Therapy (Signed)
[facility] [address] [place], [place], [address] Phone: [phone]   Fax:  [phone]  Physical Therapy Evaluation  Patient Details  Name: Julia Kim MRN: [id number] Date of Birth: 05/22/1956 Referring Provider: Dr. Sheran Luz   Encounter Date: 02/22/2018  PT End of Session - 02/22/18 1632    Visit Number  1    Number of Visits  12    Date for PT Re-Evaluation  04/05/18    Authorization Type  [facility] [employer] $20 copay    PT Start Time  1547    PT Stop Time  1623    PT Time Calculation (min)  36 min    Activity Tolerance  Patient tolerated treatment well    Behavior During Therapy  [facility] for tasks assessed/performed       Past Medical History:  Diagnosis Date  . Allergic sinusitis   . Allergy   . Cutaneous lupus erythematosus   . Elevated blood pressure (not hypertension)   . Flushing   . Full dentures   . GERD (gastroesophageal reflux disease) 06/29/2005   EGD ,( Dr. Jarold Motto)  . Glaucoma   . Hypercholesterolemia   . Hyperlipidemia   . Hypertension   . Stenosing tenosynovitis of finger    R  Thumb  . Unspecified vitamin D deficiency   . Wears glasses     Past Surgical History:  Procedure Laterality Date  . CESAREAN SECTION  1975  . COLONOSCOPY    . FOOT SURGERY  2002   Right   . TRIGGER FINGER RELEASE Right 09/15/2017   Procedure: RELEASE TRIGGER FINGER/A-1 PULLEY RIGHT THUMB;  Surgeon: Cindee Salt, MD;  Location: [facility];  Service: Orthopedics;  Laterality: Right;  Bier block  . ULNAR NERVE TRANSPOSITION Right 09/20/2013   Procedure: RIGHT ULNAR NERVE DECOMPRESSION;  Surgeon: Nicki Reaper, MD;  Location:  SURGERY CENTER;  Service: Orthopedics;  Laterality: Right;  . UPPER GASTROINTESTINAL ENDOSCOPY    . UPPER GI ENDOSCOPY      There were no vitals filed for this visit.   Subjective Assessment - 02/22/18 1550    Subjective  Pt is a 62 y/o female who presents to OPPT for Lt  sided neck pain radiating into Lt shoulder.  Pt reports pain x 3-4 months, with unknown MOI.  Pt dx with prednisone, but hasn't started taking the medication yet.    Pertinent History  glaucoma, HTN    Diagnostic tests  MRI: Mild foraminal narrowing bilaterally C4-5 due to spurring; Moderate left foraminal narrowing C5-6 due to spurring. Mild right foraminal narrowing mild spinal stenosis at C5-6 due to spurring    Patient Stated Goals  improve pain    Currently in Pain?  Yes    Pain Score  4  up to 5/10    Pain Location  Neck    Pain Orientation  Left    Pain Descriptors / Indicators  Dull;Aching    Pain Type  Chronic pain    Pain Radiating Towards  Lt shoulder (previously into Lt elbow; but none recently)    Pain Onset  More than a month ago    Pain Frequency  Constant    Aggravating Factors   unknown, ?sometimes sleeping on Lt side    Pain Relieving Factors  pain cream, heat, advil         [facility] PT Assessment - 02/22/18 1556      Assessment   Medical Diagnosis  cervical radiculopathy  Referring Provider  Dr. Sheran Luz    Onset Date/Surgical Date  -- Dec 2018    Hand Dominance  Left    Next MD Visit  03/14/18    Prior Therapy  none for this condition      Precautions   Precautions  None      Restrictions   Weight Bearing Restrictions  No      Balance Screen   Has the patient fallen in the past 6 months  No    Has the patient had a decrease in activity level because of a fear of falling?   No    Is the patient reluctant to leave their home because of a fear of falling?   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    Leisure  watch TV, dancing, go to football/basketball games      Cognition   Overall Cognitive Status  Within  Functional Limits for tasks assessed      Observation/Other Assessments   Focus on Therapeutic Outcomes (FOTO)   74 (26% limited; predicted 24% limited)      Posture/Postural Control   Posture/Postural Control  Postural limitations    Postural Limitations  Rounded Shoulders;Forward head;Increased thoracic kyphosis      ROM / Strength   AROM / PROM / Strength  AROM;Strength      AROM   AROM Assessment Site  Cervical    Cervical Flexion  45    Cervical Extension  36    Cervical - Right Side Bend  33    Cervical - Left Side Bend  15    Cervical - Right Rotation  51    Cervical - Left Rotation  60      Strength   Strength Assessment Site  Shoulder;Elbow    Right/Left Shoulder  Right;Left    Right Shoulder Flexion  4/5    Right Shoulder ABduction  4/5    Right Shoulder Internal Rotation  5/5    Right Shoulder External Rotation  5/5    Left Shoulder Flexion  4/5    Left Shoulder ABduction  5/5    Left Shoulder Internal Rotation  5/5    Left Shoulder External Rotation  5/5    Right/Left Elbow  Right;Left    Right Elbow Flexion  5/5    Right Elbow Extension  5/5    Left Elbow Flexion  5/5    Left Elbow Extension  5/5      Palpation   Palpation comment  trigger points in Lt > Rt cervical paraspinals; upper trap; levator scapula       Special Tests    Special Tests  Cervical    Cervical Tests  Spurling's;Dictraction      Spurling's   Findings  Negative      Distraction Test   Findngs  Negative             Objective measurements completed on examination: See above findings.      [facility] Adult PT Treatment/Exercise - 02/22/18 1556      Exercises   Exercises  Neck      Neck Exercises: Seated   Other Seated Exercise  scapular retraction 5x5sec  Neck Exercises: Supine   Neck Retraction  5 reps;5 secs      Neck Exercises: Stretches   Upper Trapezius Stretch  Left;1 rep;30 seconds    Levator Stretch  Left;1 rep;30 seconds             PT Education  - 02/22/18 1632    Education provided  Yes    Education Details  HEP, DN, clinical findings, POC    Person(s) Educated  Patient    Methods  Explanation;Demonstration;Handout    Comprehension  Verbalized understanding;Returned demonstration;Need further instruction          PT Long Term Goals - 02/22/18 1637      PT LONG TERM GOAL #1   Title  independent with HEP    Status  New    Target Date  04/05/18      PT LONG TERM GOAL #2   Title  perform cervical ROM without increase in pain for improved function    Status  New    Target Date  04/05/18      PT LONG TERM GOAL #3   Title  demonstrate improved postural awareness in clinic at least 50% of the time    Status  New    Target Date  04/05/18      PT LONG TERM GOAL #4   Title  report pain < 3/10 for improved function and pain    Status  New    Target Date  04/05/18             Plan - 02/22/18 1633    Clinical Impression Statement  Pt is a 62 y/o female who presents to OPPT for Lt sided neck pain with mild radicular symptoms.  Pt with active trigger points in Lt cervical paraspinals, upper trap and levator scapulae as well as poor postrual awareness and mild strength and ROM deficits.  Pt will benefit from PT to address deficits listed.  Recommend DN but pt unsure at this time if she would like to pursure as an option at this time.    Clinical Presentation  Stable    Clinical Decision Making  Low    Rehab Potential  Good    PT Frequency  2x / week    PT Duration  6 weeks    PT Treatment/Interventions  ADLs/Self Care Home Management;Cryotherapy;Electrical Stimulation;Ultrasound;Traction;Moist Heat;Functional mobility training;Therapeutic activities;Therapeutic exercise;Patient/family education;Neuromuscular re-education;Manual techniques;Taping;Dry needling    PT Next Visit Plan  review HEP, manual/modalities/DN PRN, posture exercises    Consulted and Agree with Plan of Care  Patient       Patient will benefit from  skilled therapeutic intervention in order to improve the following deficits and impairments:  Increased fascial restricitons, Increased muscle spasms, Pain, Postural dysfunction, Impaired flexibility, Decreased strength, Decreased range of motion, Impaired UE functional use  Visit Diagnosis: Cervicalgia - Plan: PT plan of care cert/re-cert  Abnormal posture - Plan: PT plan of care cert/re-cert     Problem List Patient Active Problem List   Diagnosis Date Noted  . Impingement syndrome of right shoulder 05/16/2017  . Special screening for malignant neoplasms, colon 11/28/2011  . Diverticulosis of colon (without mention of hemorrhage) 11/28/2011  . Abdominal pain 11/15/2011  . Polyp of gallbladder 11/15/2011      Clarita Crane, PT, DPT 02/22/18 4:41 PM    [facility] [address] [place], [place], [address] Phone: [phone]   Fax:  [phone]  Name: LYNDALL BELLOT MRN:  [id number] Date of Birth: 04/09/1956

## 2018-02-22 NOTE — Patient Instructions (Addendum)
Supine Push    Take a deep breath and exhale while pushing the back of neck down on the bed. Hold __5__ seconds. Repeat _10___ times. Do __2-3__ sessions per day.  Scapular Retraction (Standing)    With arms at sides, pinch shoulder blades together.  Hold for 5 seconds. "Elbows into back pockets." Repeat __10__ times per set. Do __1__ sets per session. Do __2-3__ sessions per day.  Flexibility: Upper Trapezius Stretch    Gently grasp left side of head while reaching behind back with other hand. Tilt head away until a gentle stretch is felt. Hold _30___ seconds. Repeat _2-3___ times per set. Do __1__ sets per session. Do _2-3___ sessions per day.   Levator Scapula Stretch, Sitting    Sit, one hand tucked under hip on side to be stretched, other hand over top of head. Turn head toward other side and look down. Use hand on head to gently stretch neck in that position. Hold _30__ seconds. Repeat _2-3__ times per session. Do __2-3_ sessions per day.  Copyright  VHI. All rights reserved.      Trigger Point Dry Needling  . What is Trigger Point Dry Needling (DN)? o DN is a physical therapy technique used to treat muscle pain and dysfunction. Specifically, DN helps deactivate muscle trigger points (muscle knots).  o A thin filiform needle is used to penetrate the skin and stimulate the underlying trigger point. The goal is for a local twitch response (LTR) to occur and for the trigger point to relax. No medication of any kind is injected during the procedure.   . What Does Trigger Point Dry Needling Feel Like?  o The procedure feels different for each individual patient. Some patients report that they do not actually feel the needle enter the skin and overall the process is not painful. Very mild bleeding may occur. However, many patients feel a deep cramping in the muscle in which the needle was inserted. This is the local twitch response.   Marland Kitchen. How Will I feel after the  treatment? o Soreness is normal, and the onset of soreness may not occur for a few hours. Typically this soreness does not last longer than two days.  o Bruising is uncommon, however; ice can be used to decrease any possible bruising.  o In rare cases feeling tired or nauseous after the treatment is normal. In addition, your symptoms may get worse before they get better, this period will typically not last longer than 24 hours.   . What Can I do After My Treatment? o Increase your hydration by drinking more water for the next 24 hours. o You may place ice or heat on the areas treated that have become sore, however, do not use heat on inflamed or bruised areas. Heat often brings more relief post needling. o You can continue your regular activities, but vigorous activity is not recommended initially after the treatment for 24 hours. o DN is best combined with other physical therapy such as strengthening, stretching, and other therapies.

## 2018-02-26 DIAGNOSIS — R109 Unspecified abdominal pain: Secondary | ICD-10-CM | POA: Diagnosis not present

## 2018-02-27 ENCOUNTER — Ambulatory Visit: Payer: 59 | Admitting: Physical Therapy

## 2018-03-07 ENCOUNTER — Ambulatory Visit: Payer: 59 | Admitting: Physical Therapy

## 2018-03-07 ENCOUNTER — Encounter: Payer: Self-pay | Admitting: Physical Therapy

## 2018-03-07 DIAGNOSIS — R293 Abnormal posture: Secondary | ICD-10-CM

## 2018-03-07 DIAGNOSIS — M542 Cervicalgia: Secondary | ICD-10-CM

## 2018-03-07 NOTE — Therapy (Signed)
[facility] [address] [place], [place], [address] Phone: [phone]   Fax:  [phone]  Physical Therapy Treatment  Patient Details  Name: Julia Kim MRN: [id number] Date of Birth: 08/28/1956 Referring Provider: Dr. Sheran Luz   Encounter Date: 03/07/2018  PT End of Session - 03/07/18 0802    Visit Number  2    Number of Visits  12    Date for PT Re-Evaluation  04/05/18    Authorization Type  [facility] $20 copay    PT Start Time  0801    PT Stop Time  0828 pt had to leave for work    PT Time Calculation (min)  27 min    Activity Tolerance  Patient tolerated treatment well    Behavior During Therapy  [facility] for tasks assessed/performed       Past Medical History:  Diagnosis Date  . Allergic sinusitis   . Allergy   . Cutaneous lupus erythematosus   . Elevated blood pressure (not hypertension)   . Flushing   . Full dentures   . GERD (gastroesophageal reflux disease) 06/29/2005   EGD ,( Dr. Jarold Motto)  . Glaucoma   . Hypercholesterolemia   . Hyperlipidemia   . Hypertension   . Stenosing tenosynovitis of finger    R  Thumb  . Unspecified vitamin D deficiency   . Wears glasses     Past Surgical History:  Procedure Laterality Date  . CESAREAN SECTION  1975  . COLONOSCOPY    . FOOT SURGERY  2002   Right   . TRIGGER FINGER RELEASE Right 09/15/2017   Procedure: RELEASE TRIGGER FINGER/A-1 PULLEY RIGHT THUMB;  Surgeon: Cindee Salt, MD;  Location: [facility];  Service: Orthopedics;  Laterality: Right;  Bier block  . ULNAR NERVE TRANSPOSITION Right 09/20/2013   Procedure: RIGHT ULNAR NERVE DECOMPRESSION;  Surgeon: Nicki Reaper, MD;  Location: [facility];  Service: Orthopedics;  Laterality: Right;  . UPPER GASTROINTESTINAL ENDOSCOPY    . UPPER GI ENDOSCOPY      There were no vitals filed for this visit.  Subjective Assessment - 03/07/18 0803    Subjective  Feeling okay, just a little  twinge.     Currently in Pain?  Yes    Pain Score  -- 3 or 4    Pain Location  Neck    Pain Orientation  Left    Pain Descriptors / Indicators  -- twinge                      [facility] Adult PT Treatment/Exercise - 03/07/18 0001      Neck Exercises: Machines for Strengthening   UBE (Upper Arm Bike)  retro 3 min L1      Neck Exercises: Seated   Other Seated Exercise  external rotation red tband      Manual Therapy   Manual Therapy  Soft tissue mobilization    Soft tissue mobilization  bil upper trap, suboccipitals, upper trap stretching, cervical traction      Neck Exercises: Stretches   Other Neck Stretches  supine pec stretch 2x20s                  PT Long Term Goals - 02/22/18 1637      PT LONG TERM GOAL #1   Title  independent with HEP    Status  New    Target Date  04/05/18  PT LONG TERM GOAL #2   Title  perform cervical ROM without increase in pain for improved function    Status  New    Target Date  04/05/18      PT LONG TERM GOAL #3   Title  demonstrate improved postural awareness in clinic at least 50% of the time    Status  New    Target Date  04/05/18      PT LONG TERM GOAL #4   Title  report pain < 3/10 for improved function and pain    Status  New    Target Date  04/05/18            Plan - 03/07/18 0806    Clinical Impression Statement  Pt reported feeling really good after treatment today. Concordant pain in Lt upper trap trigger points reduced with manual techniques. Discussed using DN if she feels that these techniques are helpful.     PT Treatment/Interventions  ADLs/Self Care Home Management;Cryotherapy;Electrical Stimulation;Ultrasound;Traction;Moist Heat;Functional mobility training;Therapeutic activities;Therapeutic exercise;Patient/family education;Neuromuscular re-education;Manual techniques;Taping;Dry needling    PT Next Visit Plan  manual/DN PRN, periscapular strengthening    PT Home Exercise Plan  cervical &  scapular retraction, upper trap & levator stretch, ER red tband, abd behind back red tband    Consulted and Agree with Plan of Care  Patient       Patient will benefit from skilled therapeutic intervention in order to improve the following deficits and impairments:  Increased fascial restricitons, Increased muscle spasms, Pain, Postural dysfunction, Impaired flexibility, Decreased strength, Decreased range of motion, Impaired UE functional use  Visit Diagnosis: Cervicalgia  Abnormal posture     Problem List Patient Active Problem List   Diagnosis Date Noted  . Impingement syndrome of right shoulder 05/16/2017  . Special screening for malignant neoplasms, colon 11/28/2011  . Diverticulosis of colon (without mention of hemorrhage) 11/28/2011  . Abdominal pain 11/15/2011  . Polyp of gallbladder 11/15/2011    Sarit Sparano C. Dashana Guizar PT, DPT 03/07/18 8:31 AM   [facility] [address] [place], [place], [address] Phone: [phone]   Fax:  [phone]  Name: Julia Kim MRN: [id number] Date of Birth: 11/01/1956

## 2018-03-13 ENCOUNTER — Encounter: Payer: Self-pay | Admitting: Physical Therapy

## 2018-03-13 ENCOUNTER — Ambulatory Visit: Payer: 59 | Admitting: Physical Therapy

## 2018-03-13 DIAGNOSIS — M542 Cervicalgia: Secondary | ICD-10-CM

## 2018-03-13 DIAGNOSIS — R293 Abnormal posture: Secondary | ICD-10-CM | POA: Diagnosis not present

## 2018-03-13 NOTE — Therapy (Signed)
[facility] [address] [place], [place], [address] Phone: [phone]   Fax:  [phone]  Physical Therapy Treatment  Patient Details  Name: Julia Kim MRN: [id number] Date of Birth: 01/26/1956 Referring Provider: Dr. Sheran Luzichard Ramos   Encounter Date: 03/13/2018  PT End of Session - 03/13/18 0925    Visit Number  3    Number of Visits  12    Date for PT Re-Evaluation  04/05/18    Authorization Type  [facility] $20 copay    PT Start Time  0843    PT Stop Time  0925    PT Time Calculation (min)  42 min    Activity Tolerance  Patient tolerated treatment well    Behavior During Therapy  [facility] for tasks assessed/performed       Past Medical History:  Diagnosis Date  . Allergic sinusitis   . Allergy   . Cutaneous lupus erythematosus   . Elevated blood pressure (not hypertension)   . Flushing   . Full dentures   . GERD (gastroesophageal reflux disease) 06/29/2005   EGD ,( Dr. Jarold MottoPatterson)  . Glaucoma   . Hypercholesterolemia   . Hyperlipidemia   . Hypertension   . Stenosing tenosynovitis of finger    R  Thumb  . Unspecified vitamin D deficiency   . Wears glasses     Past Surgical History:  Procedure Laterality Date  . CESAREAN SECTION  1975  . COLONOSCOPY    . FOOT SURGERY  2002   Right   . TRIGGER FINGER RELEASE Right 09/15/2017   Procedure: RELEASE TRIGGER FINGER/A-1 PULLEY RIGHT THUMB;  Surgeon: Cindee SaltKuzma, Gary, MD;  Location: [facility];  Service: Orthopedics;  Laterality: Right;  Bier block  . ULNAR NERVE TRANSPOSITION Right 09/20/2013   Procedure: RIGHT ULNAR NERVE DECOMPRESSION;  Surgeon: Nicki ReaperGary R Kuzma, MD;  Location: [facility];  Service: Orthopedics;  Laterality: Right;  . UPPER GASTROINTESTINAL ENDOSCOPY    . UPPER GI ENDOSCOPY      There were no vitals filed for this visit.  Subjective Assessment - 03/13/18 0844    Subjective  She feels good today, just has a little twinge; the  manual really helped last session     Diagnostic tests  MRI: Mild foraminal narrowing bilaterally C4-5 due to spurring; Moderate left foraminal narrowing C5-6 due to spurring. Mild right foraminal narrowing mild spinal stenosis at C5-6 due to spurring    Patient Stated Goals  improve pain    Currently in Pain?  No/denies just a little twinge, not real pain                 No data recorded       [facility] Adult PT Treatment/Exercise - 03/13/18 0001      Neck Exercises: Seated   Cervical Rotation  10 reps 2 second holds     Shoulder Rolls  10 reps;Backwards with scapular retraction squeeze     Other Seated Exercise  neck retractions iwth rotation 1x10 each side     Other Seated Exercise  seated shoulder press down with red TB 1x10; horizontal shoulder ABD with scapular retraction  1x10       Manual Therapy   Manual Therapy  Soft tissue mobilization    Manual therapy comments  performed separately from all other skilled services     Soft tissue mobilization  L upper trap, levator, paraspinals       Neck Exercises: Stretches  Upper Trapezius Stretch  3 reps;30 seconds;Right;Left    Levator Stretch  Right;Left;2 reps;30 seconds    Corner Stretch  2 reps    Chest Stretch  30 seconds;2 reps             PT Education - 03/13/18 [id number]    Education provided  Yes    Education Details  possible benefits of DN     Person(s) Educated  Patient    Methods  Explanation    Comprehension  Verbalized understanding          PT Long Term Goals - 02/22/18 1637      PT LONG TERM GOAL #1   Title  independent with HEP    Status  New    Target Date  04/05/18      PT LONG TERM GOAL #2   Title  perform cervical ROM without increase in pain for improved function    Status  New    Target Date  04/05/18      PT LONG TERM GOAL #3   Title  demonstrate improved postural awareness in clinic at least 50% of the time    Status  New    Target Date  04/05/18      PT LONG TERM GOAL #4    Title  report pain < 3/10 for improved function and pain    Status  New    Target Date  04/05/18            Plan - 03/13/18 0925    Clinical Impression Statement Patient arrives today reporting she is feeling well, she is just having a little bit of a twinge this morning; the manual performed last session seemed to help her the most. Progressed functional exercises and postural training today with intermittent cues for form provided as needed. Finished session with extensive soft tissue work to cervical paraspinals, levators, and upper trap musculature. Patient without increase in pain throughout session. Palpable muscle knots noted L upper trap during session and was addressed via soft tissue mobilization today   Rehab Potential  Good    PT Frequency  2x / week    PT Duration  6 weeks    PT Treatment/Interventions  ADLs/Self Care Home Management;Cryotherapy;Electrical Stimulation;Ultrasound;Traction;Moist Heat;Functional mobility training;Therapeutic activities;Therapeutic exercise;Patient/family education;Neuromuscular re-education;Manual techniques;Taping;Dry needling    PT Next Visit Plan  continue to progress periscap strenghtening, continue manual    PT Home Exercise Plan  cervical & scapular retraction, upper trap & levator stretch, ER red tband, abd behind back red tband    Consulted and Agree with Plan of Care  Patient       Patient will benefit from skilled therapeutic intervention in order to improve the following deficits and impairments:  Increased fascial restricitons, Increased muscle spasms, Pain, Postural dysfunction, Impaired flexibility, Decreased strength, Decreased range of motion, Impaired UE functional use  Visit Diagnosis: Cervicalgia  Abnormal posture     Problem List Patient Active Problem List   Diagnosis Date Noted  . Impingement syndrome of right shoulder 05/16/2017  . Special screening for malignant neoplasms, colon 11/28/2011  . Diverticulosis  of colon (without mention of hemorrhage) 11/28/2011  . Abdominal pain 11/15/2011  . Polyp of gallbladder 11/15/2011    Nedra Hai PT, DPT, CBIS  Supplemental Physical Therapist [facility]   Pager [phone]   [facility] [address] [place], [place], [address] Phone: [phone]   Fax:  [phone]  Name: Julia Kim MRN: [id number]  Date of Birth: 30-Aug-1956

## 2018-03-14 DIAGNOSIS — M79602 Pain in left arm: Secondary | ICD-10-CM | POA: Diagnosis not present

## 2018-03-14 DIAGNOSIS — M542 Cervicalgia: Secondary | ICD-10-CM | POA: Diagnosis not present

## 2018-03-15 ENCOUNTER — Ambulatory Visit: Payer: 59 | Admitting: Physical Therapy

## 2018-03-15 ENCOUNTER — Encounter: Payer: Self-pay | Admitting: Physical Therapy

## 2018-03-15 DIAGNOSIS — R293 Abnormal posture: Secondary | ICD-10-CM

## 2018-03-15 DIAGNOSIS — M542 Cervicalgia: Secondary | ICD-10-CM | POA: Diagnosis not present

## 2018-03-15 NOTE — Patient Instructions (Signed)
Over Head Pull: Narrow Grip       On back, knees bent, feet flat, band across thighs, elbows straight but relaxed. Pull hands apart (start). Keeping elbows straight, bring arms up and over head, hands toward floor. Keep pull steady on band. Hold momentarily. Return slowly, keeping pull steady, back to start. Repeat __10-20_ times. Band color ___RED___   Side Pull: Double Arm   On back, knees bent, feet flat. Arms perpendicular to body, shoulder level, elbows straight but relaxed. Pull arms out to sides, elbows straight. Resistance band comes across collarbones, hands toward floor. Hold momentarily. Slowly return to starting position. Repeat _10-20__ times. Band color ___RED__   Sash   On back, knees bent, feet flat, left hand on left hip, right hand above left. Pull right arm DIAGONALLY (hip to shoulder) across chest. Bring right arm along head toward floor. Hold momentarily. Slowly return to starting position. Repeat _10-20__ times. Do with left arm. Band color ___RED___   Shoulder Rotation: Double Arm   On back, knees bent, feet flat, elbows tucked at sides, bent 90, hands palms up. Pull hands apart and down toward floor, keeping elbows near sides. Hold momentarily. Slowly return to starting position. Repeat _10-20__ times. Band color ___RED___      

## 2018-03-15 NOTE — Therapy (Signed)
[place], [place], [address] Phone: [phone]   Fax:  [phone]  Physical Therapy Treatment  Patient Details  Name: Julia Kim MRN: [id number] Date of Birth: 10/01/1956 Referring Provider: Dr. Suella Broad   Encounter Date: 03/15/2018  PT End of Session - 03/15/18 0753    Visit Number  4    Number of Visits  12    Date for PT Re-Evaluation  04/05/18    PT Start Time  0732    PT Stop Time  0803    PT Time Calculation (min)  31 min    Activity Tolerance  Patient tolerated treatment well    Behavior During Therapy  [facility] for tasks assessed/performed       Past Medical History:  Diagnosis Date  . Allergic sinusitis   . Allergy   . Cutaneous lupus erythematosus   . Elevated blood pressure (not hypertension)   . Flushing   . Full dentures   . GERD (gastroesophageal reflux disease) 06/29/2005   EGD ,( Dr. Sharlett Iles)  . Glaucoma   . Hypercholesterolemia   . Hyperlipidemia   . Hypertension   . Stenosing tenosynovitis of finger    R  Thumb  . Unspecified vitamin D deficiency   . Wears glasses     Past Surgical History:  Procedure Laterality Date  . CESAREAN SECTION  1975  . COLONOSCOPY    . FOOT SURGERY  2002   Right   . TRIGGER FINGER RELEASE Right 09/15/2017   Procedure: RELEASE TRIGGER FINGER/A-1 PULLEY RIGHT THUMB;  Surgeon: Daryll Brod, MD;  Location: [place];  Service: Orthopedics;  Laterality: Right;  Bier block  . ULNAR NERVE TRANSPOSITION Right 09/20/2013   Procedure: RIGHT ULNAR NERVE DECOMPRESSION;  Surgeon: Wynonia Sours, MD;  Location: [place];  Service: Orthopedics;  Laterality: Right;  . UPPER GASTROINTESTINAL ENDOSCOPY    . UPPER GI ENDOSCOPY      There were no vitals filed for this visit.  Subjective Assessment - 03/15/18 0745    Subjective  Small twinge.  I have a foot stool  to help my sitting at work.      Currently in Pain?   No/denies    Pain Location  Neck    Pain Orientation  Left    Pain Descriptors / Indicators  -- stiff    Aggravating Factors   dirving sometimes.       Pain Relieving Factors  advil                No data recorded               PT Education - 03/15/18 0752    Education provided  Yes    Education Details  HEP    Person(s) Educated  Patient    Methods  Explanation;Demonstration;Tactile cues;Verbal cues;Handout    Comprehension  Verbalized understanding          PT Long Term Goals - 03/15/18 0810      PT LONG TERM GOAL #1   Title  independent with HEP    Baseline  Patient has no questions about current HEP    Time  6    Status  On-going      PT LONG TERM GOAL #2   Title  perform cervical ROM without increase in pain for improved function    Baseline  sometimes has pain with this    Time  6  Period  Weeks    Status  On-going      PT LONG TERM GOAL #3   Title  demonstrate improved postural awareness in clinic at least 50% of the time    Time  6    Period  Weeks    Status  Unable to assess      PT LONG TERM GOAL #4   Title  report pain < 3/10 for improved function and pain    Baseline  2/10 at the most pain yesterday.  No pain just stiffness this morning.    Time  6    Period  Weeks    Status  Partially Met            Plan - 03/15/18 2761    Clinical Impression Statement  Patient was able  to tolerate progression of band exercises without increased pain.  Pain now intermittant.  Progress towardLTG #3 for pain.  No pain at end of session.    PT Next Visit Plan    Review supine scap stabilization. strenghtening, continue manual    PT Home Exercise Plan  cervical & scapular retraction, upper trap & levator stretch, ER red tband, abd behind back red tband,  supine scapular stabilization series    Consulted and Agree with Plan of Care  Patient       Patient will benefit from skilled therapeutic intervention in order to improve the  following deficits and impairments:     Visit Diagnosis: Cervicalgia  Abnormal posture     Problem List Patient Active Problem List   Diagnosis Date Noted  . Impingement syndrome of right shoulder 05/16/2017  . Special screening for malignant neoplasms, colon 11/28/2011  . Diverticulosis of colon (without mention of hemorrhage) 11/28/2011  . Abdominal pain 11/15/2011  . Polyp of gallbladder 11/15/2011    Julia Kim PTA 03/15/2018, 8:13 AM  [facility] [address] [place], [place], [address] Phone: [phone]   Fax:  [phone]  Name: Julia Kim MRN: [id number] Date of Birth: [birth year]

## 2018-03-20 ENCOUNTER — Encounter: Payer: Self-pay | Admitting: Physical Therapy

## 2018-03-20 ENCOUNTER — Ambulatory Visit: Payer: 59 | Attending: Physical Medicine and Rehabilitation | Admitting: Physical Therapy

## 2018-03-20 DIAGNOSIS — R293 Abnormal posture: Secondary | ICD-10-CM | POA: Insufficient documentation

## 2018-03-20 DIAGNOSIS — M25552 Pain in left hip: Secondary | ICD-10-CM | POA: Insufficient documentation

## 2018-03-20 DIAGNOSIS — R262 Difficulty in walking, not elsewhere classified: Secondary | ICD-10-CM | POA: Diagnosis present

## 2018-03-20 DIAGNOSIS — M542 Cervicalgia: Secondary | ICD-10-CM | POA: Insufficient documentation

## 2018-03-20 NOTE — Therapy (Signed)
[place], [place], [address] Phone: [phone]   Fax:  [phone]  Physical Therapy Treatment  Patient Details  Name: Julia Kim MRN: [id number] Date of Birth: 11/12/1956 Referring Provider: Dr. Suella Broad   Encounter Date: 03/20/2018  PT End of Session - 03/20/18 1625    Visit Number  5    Number of Visits  12    Date for PT Re-Evaluation  04/05/18    Authorization Type  [facility] [employer] $20 copay    PT Start Time  1625    PT Stop Time  1701 pt requested to end at 5    PT Time Calculation (min)  36 min    Activity Tolerance  Patient tolerated treatment well    Behavior During Therapy  [facility] for tasks assessed/performed       Past Medical History:  Diagnosis Date  . Allergic sinusitis   . Allergy   . Cutaneous lupus erythematosus   . Elevated blood pressure (not hypertension)   . Flushing   . Full dentures   . GERD (gastroesophageal reflux disease) 06/29/2005   EGD ,( Dr. Sharlett Iles)  . Glaucoma   . Hypercholesterolemia   . Hyperlipidemia   . Hypertension   . Stenosing tenosynovitis of finger    R  Thumb  . Unspecified vitamin D deficiency   . Wears glasses     Past Surgical History:  Procedure Laterality Date  . CESAREAN SECTION  1975  . COLONOSCOPY    . FOOT SURGERY  2002   Right   . TRIGGER FINGER RELEASE Right 09/15/2017   Procedure: RELEASE TRIGGER FINGER/A-1 PULLEY RIGHT THUMB;  Surgeon: Daryll Brod, MD;  Location: [place];  Service: Orthopedics;  Laterality: Right;  Bier block  . ULNAR NERVE TRANSPOSITION Right 09/20/2013   Procedure: RIGHT ULNAR NERVE DECOMPRESSION;  Surgeon: Wynonia Sours, MD;  Location: [place];  Service: Orthopedics;  Laterality: Right;  . UPPER GASTROINTESTINAL ENDOSCOPY    . UPPER GI ENDOSCOPY      There were no vitals filed for this visit.  Subjective Assessment - 03/20/18 1625    Subjective  denies pain in neck.     Diagnostic tests  MRI: Mild foraminal narrowing bilaterally C4-5 due to spurring; Moderate left foraminal narrowing C5-6 due to spurring. Mild right foraminal narrowing mild spinal stenosis at C5-6 due to spurring    Currently in Pain?  No/denies                       [facility] Adult PT Treatment/Exercise - 03/20/18 0001      Neck Exercises: Machines for Strengthening   UBE (Upper Arm Bike)  retro L1 4 min      Neck Exercises: Standing   Wall Push Ups  10 reps 2 sets    Upper Extremity Flexion with Stabilization  20 reps + abd on yellow tband    Other Standing Exercises  row green tband      Neck Exercises: Prone   Shoulder Extension  15 reps retraction + extension    Other Prone Exercise  scapular retraction      Manual Therapy   Manual Therapy  Joint mobilization    Joint Mobilization  prone thoracic PA nd rib ER    Soft tissue mobilization  Bil upper trap                  PT Long Term  Goals - 03/15/18 0810      PT LONG TERM GOAL #1   Title  independent with HEP    Baseline  Patient has no questions about current HEP    Time  6    Status  On-going      PT LONG TERM GOAL #2   Title  perform cervical ROM without increase in pain for improved function    Baseline  sometimes has pain with this    Time  6    Period  Weeks    Status  On-going      PT LONG TERM GOAL #3   Title  demonstrate improved postural awareness in clinic at least 50% of the time    Time  6    Period  Weeks    Status  Unable to assess      PT LONG TERM GOAL #4   Title  report pain < 3/10 for improved function and pain    Baseline  2/10 at the most pain yesterday.  No pain just stiffness this morning.    Time  6    Period  Weeks    Status  Partially Met            Plan - 03/20/18 1703    Clinical Impression Statement  Continued to progress periscapular exercises which pt tolerated well. Cues required to reduce upper trap activation. No pain with exercises.     PT  Treatment/Interventions  ADLs/Self Care Home Management;Cryotherapy;Electrical Stimulation;Ultrasound;Traction;Moist Heat;Functional mobility training;Therapeutic activities;Therapeutic exercise;Patient/family education;Neuromuscular re-education;Manual techniques;Taping;Dry needling    PT Next Visit Plan  lifting technique, overhead challenges, body blade- reduce upper trap overuse    PT Home Exercise Plan  cervical & scapular retraction, upper trap & levator stretch, ER red tband, abd behind back red tband,  supine scapular stabilization series; wall walks, flexion+abd band    Consulted and Agree with Plan of Care  Patient       Patient will benefit from skilled therapeutic intervention in order to improve the following deficits and impairments:  Increased fascial restricitons, Increased muscle spasms, Pain, Postural dysfunction, Impaired flexibility, Decreased strength, Decreased range of motion, Impaired UE functional use  Visit Diagnosis: Cervicalgia  Abnormal posture  Pain in left hip  Difficulty in walking, not elsewhere classified     Problem List Patient Active Problem List   Diagnosis Date Noted  . Impingement syndrome of right shoulder 05/16/2017  . Special screening for malignant neoplasms, colon 11/28/2011  . Diverticulosis of colon (without mention of hemorrhage) 11/28/2011  . Abdominal pain 11/15/2011  . Polyp of gallbladder 11/15/2011    [name] PT, DPT 03/20/18 5:06 PM   [facility] [address] [place], [place], [address] Phone: [phone]   Fax:  [phone]  Name: Julia Kim MRN: [id number] Date of Birth: 02/23/1956   

## 2018-03-22 ENCOUNTER — Encounter: Payer: Self-pay | Admitting: Physical Therapy

## 2018-03-22 ENCOUNTER — Ambulatory Visit: Payer: 59 | Admitting: Physical Therapy

## 2018-03-22 DIAGNOSIS — M542 Cervicalgia: Secondary | ICD-10-CM | POA: Diagnosis not present

## 2018-03-22 DIAGNOSIS — M25552 Pain in left hip: Secondary | ICD-10-CM | POA: Diagnosis not present

## 2018-03-22 DIAGNOSIS — R293 Abnormal posture: Secondary | ICD-10-CM

## 2018-03-22 DIAGNOSIS — R262 Difficulty in walking, not elsewhere classified: Secondary | ICD-10-CM | POA: Diagnosis not present

## 2018-03-22 NOTE — Therapy (Signed)
[place], [place], [address] Phone: [phone]   Fax:  [phone]  Physical Therapy Treatment  Patient Details  Name: Julia Kim MRN: [id number] Date of Birth: [birth year] Referring Provider: Dr. Suella Broad   Encounter Date: 03/22/2018  PT End of Session - 03/22/18 1633    Visit Number  6    Number of Visits  12    Date for PT Re-Evaluation  04/05/18    Authorization Type  [facility] [employer] $20 copay    PT Start Time  1630    PT Stop Time  1708    PT Time Calculation (min)  38 min    Activity Tolerance  Patient tolerated treatment well    Behavior During Therapy  [facility] for tasks assessed/performed       Past Medical History:  Diagnosis Date  . Allergic sinusitis   . Allergy   . Cutaneous lupus erythematosus   . Elevated blood pressure (not hypertension)   . Flushing   . Full dentures   . GERD (gastroesophageal reflux disease) 06/29/2005   EGD ,( Dr. Sharlett Iles)  . Glaucoma   . Hypercholesterolemia   . Hyperlipidemia   . Hypertension   . Stenosing tenosynovitis of finger    R  Thumb  . Unspecified vitamin D deficiency   . Wears glasses     Past Surgical History:  Procedure Laterality Date  . CESAREAN SECTION  1975  . COLONOSCOPY    . FOOT SURGERY  2002   Right   . TRIGGER FINGER RELEASE Right 09/15/2017   Procedure: RELEASE TRIGGER FINGER/A-1 PULLEY RIGHT THUMB;  Surgeon: Daryll Brod, MD;  Location: [place];  Service: Orthopedics;  Laterality: Right;  Bier block  . ULNAR NERVE TRANSPOSITION Right 09/20/2013   Procedure: RIGHT ULNAR NERVE DECOMPRESSION;  Surgeon: Wynonia Sours, MD;  Location: [place];  Service: Orthopedics;  Laterality: Right;  . UPPER GASTROINTESTINAL ENDOSCOPY    . UPPER GI ENDOSCOPY      There were no vitals filed for this visit.  Subjective Assessment - 03/22/18 1633    Subjective  No pain today. Took a hot shower after last visit and  has not hurt since then.     Diagnostic tests  MRI: Mild foraminal narrowing bilaterally C4-5 due to spurring; Moderate left foraminal narrowing C5-6 due to spurring. Mild right foraminal narrowing mild spinal stenosis at C5-6 due to spurring    Currently in Pain?  No/denies                       [facility] Adult PT Treatment/Exercise - 03/22/18 0001      Neck Exercises: Machines for Strengthening   Nustep  UE & LE L5 6 min      Neck Exercises: Supine   Shoulder ABduction  20 reps green tband    Other Supine Exercise  supine alt flexion 2#      Neck Exercises: Sidelying   Other Sidelying Exercise  GHJ horiz abd 2#    Other Sidelying Exercise  GHJ ABD to 90 2# x15      Neck Exercises: Prone   Other Prone Exercise  QPED UE flexion                  PT Long Term Goals - 03/15/18 0810      PT LONG TERM GOAL #1   Title  independent with HEP    Baseline  Patient has no questions about current HEP    Time  6    Status  On-going      PT LONG TERM GOAL #2   Title  perform cervical ROM without increase in pain for improved function    Baseline  sometimes has pain with this    Time  6    Period  Weeks    Status  On-going      PT LONG TERM GOAL #3   Title  demonstrate improved postural awareness in clinic at least 50% of the time    Time  6    Period  Weeks    Status  Unable to assess      PT LONG TERM GOAL #4   Title  report pain < 3/10 for improved function and pain    Baseline  2/10 at the most pain yesterday.  No pain just stiffness this morning.    Time  6    Period  Weeks    Status  Partially Met            Plan - 03/22/18 1714    Clinical Impression Statement  Increased strength challenges to periscapular region with cues required for decreased use of upper traps. Denied pain and will d/c next week if she still has no pain.     PT Treatment/Interventions  ADLs/Self Care Home Management;Cryotherapy;Electrical  Stimulation;Ultrasound;Traction;Moist Heat;Functional mobility training;Therapeutic activities;Therapeutic exercise;Patient/family education;Neuromuscular re-education;Manual techniques;Taping;Dry needling    PT Next Visit Plan  OH reaching/lifting    PT Home Exercise Plan  cervical & scapular retraction, upper trap & levator stretch, ER red tband, abd behind back red tband,  supine scapular stabilization series; wall walks, flexion+abd band; supine horiz abd, sidelying horiz abd, qped GHJ flx    Consulted and Agree with Plan of Care  Patient       Patient will benefit from skilled therapeutic intervention in order to improve the following deficits and impairments:  Increased fascial restricitons, Increased muscle spasms, Pain, Postural dysfunction, Impaired flexibility, Decreased strength, Decreased range of motion, Impaired UE functional use  Visit Diagnosis: Cervicalgia  Abnormal posture     Problem List Patient Active Problem List   Diagnosis Date Noted  . Impingement syndrome of right shoulder 05/16/2017  . Special screening for malignant neoplasms, colon 11/28/2011  . Diverticulosis of colon (without mention of hemorrhage) 11/28/2011  . Abdominal pain 11/15/2011  . Polyp of gallbladder 11/15/2011   Katie Moch C. Delfino Friesen PT, DPT 03/22/18 5:16 PM   [facility] [address] [place], [place], [address] Phone: [phone]   Fax:  [phone]  Name: Julia Kim MRN: [id number] Date of Birth: 09/12/1956

## 2018-03-27 ENCOUNTER — Ambulatory Visit: Payer: 59 | Admitting: Physical Therapy

## 2018-03-27 ENCOUNTER — Encounter: Payer: Self-pay | Admitting: Physical Therapy

## 2018-03-27 DIAGNOSIS — M542 Cervicalgia: Secondary | ICD-10-CM

## 2018-03-27 DIAGNOSIS — R293 Abnormal posture: Secondary | ICD-10-CM

## 2018-03-27 DIAGNOSIS — M25552 Pain in left hip: Secondary | ICD-10-CM | POA: Diagnosis not present

## 2018-03-27 DIAGNOSIS — R262 Difficulty in walking, not elsewhere classified: Secondary | ICD-10-CM

## 2018-03-27 NOTE — Therapy (Signed)
[place], [place], [address] Phone: [phone]   Fax:  [phone]  Physical Therapy Treatment  Patient Details  Name: Julia Kim MRN: [id number] Date of Birth: 1956-07-07 Referring Provider: Dr. Suella Broad   Encounter Date: 03/27/2018  PT End of Session - 03/27/18 1727    Visit Number  7    Number of Visits  12    Date for PT Re-Evaluation  04/05/18    PT Start Time  3300    PT Stop Time  7622 short session due to Patient doing well,  did not need to stay longer.  charge will not equal time slot.    PT Time Calculation (min)  34 min    Activity Tolerance  Patient tolerated treatment well    Behavior During Therapy  [facility] for tasks assessed/performed       Past Medical History:  Diagnosis Date  . Allergic sinusitis   . Allergy   . Cutaneous lupus erythematosus   . Elevated blood pressure (not hypertension)   . Flushing   . Full dentures   . GERD (gastroesophageal reflux disease) 06/29/2005   EGD ,( Dr. Sharlett Iles)  . Glaucoma   . Hypercholesterolemia   . Hyperlipidemia   . Hypertension   . Stenosing tenosynovitis of finger    R  Thumb  . Unspecified vitamin D deficiency   . Wears glasses     Past Surgical History:  Procedure Laterality Date  . CESAREAN SECTION  1975  . COLONOSCOPY    . FOOT SURGERY  2002   Right   . TRIGGER FINGER RELEASE Right 09/15/2017   Procedure: RELEASE TRIGGER FINGER/A-1 PULLEY RIGHT THUMB;  Surgeon: Daryll Brod, MD;  Location: [place];  Service: Orthopedics;  Laterality: Right;  Bier block  . ULNAR NERVE TRANSPOSITION Right 09/20/2013   Procedure: RIGHT ULNAR NERVE DECOMPRESSION;  Surgeon: Wynonia Sours, MD;  Location: [place];  Service: Orthopedics;  Laterality: Right;  . UPPER GASTROINTESTINAL ENDOSCOPY    . UPPER GI ENDOSCOPY      There were no vitals filed for this visit.  Subjective Assessment - 03/27/18 1635    Subjective  No pain         [facility] PT Assessment - 03/27/18 0001      Observation/Other Assessments   Focus on Therapeutic Outcomes (FOTO)   87 ( 13 % LIMITATION) 24 % predicted                   [facility] Adult PT Treatment/Exercise - 03/27/18 0001      Therapeutic Activites    Therapeutic Activities  Other Therapeutic Activities    Other Therapeutic Activities  linned placed on shelf overhead without increased pain      Neck Exercises: Machines for Strengthening   Nustep  UE & LE L5   5 min      Neck Exercises: Standing   Wall Push Ups  10 reps 2 sets  pain free    Wall Wash  both up/down motion practiceddecreased compensations on left,  improved with cues and reps.,  towel used to decrease friction    Other Standing Exercises  shelf reach single and both 10 X  0 and 2 LBS.  Some compensation initially on left    Other Standing Exercises  --      Neck Exercises: Seated   Other Seated Exercise  AROM 5 X each cervical : flexion, rotations, lateral  flexion pain free      Neck Exercises: Supine   Neck Retraction  5 reps;5 secs cued chin position initially    Shoulder ABduction  10 reps 2 LBS each with head press, ABD/ADD    Other Supine Exercise  supine alt flexion 2# with head press    Other Supine Exercise  2 LBS  and with head press circles clock wisa and counter clockwise 10 x each,        Neck Exercises: Sidelying   Other Sidelying Exercise  GHJ horiz abd 2# 10 X    Other Sidelying Exercise  pulling noted  with 2 LBS,  able to do 0 LBS      Neck Exercises: Prone   Other Prone Exercise  QPED UE flexion needed cues to avoid trunk rotation with lift left. No pain             PT Education - 03/27/18 1727    Education provided  Yes    Education Details  exercise form    Person(s) Educated  Patient    Methods  Tactile cues;Verbal cues;Demonstration;Explanation    Comprehension  Verbalized understanding;Returned demonstration          PT Long Term  Goals - 03/27/18 1734      PT LONG TERM GOAL #1   Title  independent with HEP    Baseline  Patient has no questions about current HEP    Time  6    Period  Weeks    Status  On-going      PT LONG TERM GOAL #2   Title  perform cervical ROM without increase in pain for improved function    Baseline  able to peform flexion, rotations and lateral flexion in clinic without pain.  Consistant?    Time  6    Period  Weeks    Status  Partially Met      PT LONG TERM GOAL #3   Title  demonstrate improved postural awareness in clinic at least 50% of the time    Baseline  Patient feels she is able to do this in her clinic and she was observed to do this in out clinic today.    Time  6    Period  Weeks    Status  Achieved      PT LONG TERM GOAL #4   Title  report pain < 3/10 for improved function and pain    Baseline  No pain  lately.  Consistant?    Time  6    Period  Weeks    Status  Partially Met            Plan - 03/27/18 1729    Clinical Impression Statement  No pain today.  She wants 1 more visit of pT to be sure.  FOTO  87( 13 % limitation,  24 % predicted)  No pain at end of session, she declined the need for modalities.  Patient  feels she has improved her posture awareness .  LTG's  #2, #4 partially met.      PT Next Visit Plan  1 more visit scheduled.  finalize HEP,   check goals. FOTO already done.    PT Home Exercise Plan  cervical & scapular retraction, upper trap & levator stretch, ER red tband, abd behind back red tband,  supine scapular stabilization series; wall walks, flexion+abd band; supine horiz abd, sidelying horiz abd, qped GHJ flx    Consulted and Agree  with Plan of Care  Patient       Patient will benefit from skilled therapeutic intervention in order to improve the following deficits and impairments:     Visit Diagnosis: Cervicalgia  Abnormal posture  Pain in left hip  Difficulty in walking, not elsewhere classified     Problem List Patient  Active Problem List   Diagnosis Date Noted  . Impingement syndrome of right shoulder 05/16/2017  . Special screening for malignant neoplasms, colon 11/28/2011  . Diverticulosis of colon (without mention of hemorrhage) 11/28/2011  . Abdominal pain 11/15/2011  . Polyp of gallbladder 11/15/2011    Lenell Lama PTA 03/27/2018, 5:39 PM  [facility] [address] [place], [place], [address] Phone: [phone]   Fax:  [phone]  Name: Julia Kim MRN: [id number] Date of Birth: [birth year]

## 2018-03-29 ENCOUNTER — Encounter: Payer: Self-pay | Admitting: Physical Therapy

## 2018-03-29 ENCOUNTER — Ambulatory Visit: Payer: 59 | Admitting: Physical Therapy

## 2018-03-29 DIAGNOSIS — R262 Difficulty in walking, not elsewhere classified: Secondary | ICD-10-CM

## 2018-03-29 DIAGNOSIS — R293 Abnormal posture: Secondary | ICD-10-CM | POA: Diagnosis not present

## 2018-03-29 DIAGNOSIS — M542 Cervicalgia: Secondary | ICD-10-CM | POA: Diagnosis not present

## 2018-03-29 DIAGNOSIS — M25552 Pain in left hip: Secondary | ICD-10-CM

## 2018-03-29 MED FILL — VIT D2 1.25 MG (50,000 UNIT: 1.25 MG | 84 days supply | Qty: 12 | Fill #1

## 2018-03-29 MED FILL — SPIRONOLACTONE 25 MG TABLET: 25 | 90 days supply | Qty: 90 | Fill #1

## 2018-03-29 NOTE — Therapy (Addendum)
[place], [place], [address] Phone: [phone]   Fax:  [phone]  Physical Therapy Treatment/Discharge Summary   Patient Details  Name: Julia Kim MRN: [id number] Date of Birth: 09-22-1956 Referring Provider: Dr. Suella Broad   Encounter Date: 03/29/2018  PT End of Session - 03/29/18 1627    Visit Number  8    Number of Visits  12    Date for PT Re-Evaluation  04/05/18    Authorization Type  [facility] $[id number] copay    PT Start Time  1627    PT Stop Time  1639    PT Time Calculation (min)  12 min    Activity Tolerance  Patient tolerated treatment well    Behavior During Therapy  [facility] for tasks assessed/performed       Past Medical History:  Diagnosis Date  . Allergic sinusitis   . Allergy   . Cutaneous lupus erythematosus   . Elevated blood pressure (not hypertension)   . Flushing   . Full dentures   . GERD (gastroesophageal reflux disease) 06/29/2005   EGD ,( Dr. Sharlett Iles)  . Glaucoma   . Hypercholesterolemia   . Hyperlipidemia   . Hypertension   . Stenosing tenosynovitis of finger    R  Thumb  . Unspecified vitamin D deficiency   . Wears glasses     Past Surgical History:  Procedure Laterality Date  . CESAREAN SECTION  1975  . COLONOSCOPY    . FOOT SURGERY  2002   Right   . TRIGGER FINGER RELEASE Right 09/15/2017   Procedure: RELEASE TRIGGER FINGER/A-1 PULLEY RIGHT THUMB;  Surgeon: Daryll Brod, MD;  Location: [facility];  Service: Orthopedics;  Laterality: Right;  Bier block  . ULNAR NERVE TRANSPOSITION Right 09/20/2013   Procedure: RIGHT ULNAR NERVE DECOMPRESSION;  Surgeon: Wynonia Sours, MD;  Location: [place];  Service: Orthopedics;  Laterality: Right;  . UPPER GASTROINTESTINAL ENDOSCOPY    . UPPER GI ENDOSCOPY      There were no vitals filed for this visit.  Subjective Assessment - 03/29/18 1627    Subjective  Feeling good, denies pain    Patient Stated Goals  improve pain    Currently in Pain?  No/denies         [facility] PT Assessment - 03/29/18 0001      Posture/Postural Control   Posture Comments  good postural alignment noted without cuing      AROM   Cervical Flexion  70    Cervical - Right Side Bend  30    Cervical - Left Side Bend  30    Cervical - Right Rotation  73    Cervical - Left Rotation  75      Strength   Right Shoulder Flexion  5/5    Right Shoulder ABduction  5/5    Right Shoulder Internal Rotation  5/5    Right Shoulder External Rotation  5/5    Left Shoulder Flexion  5/5    Left Shoulder ABduction  5/5    Left Shoulder Internal Rotation  5/5    Left Shoulder External Rotation  5/5      Palpation   Palpation comment  denies TTP                           PT Education - 03/29/18 1646    Education provided  Yes  Education Details  review of goals, importance of HEP    Person(s) Educated  Patient    Methods  Explanation    Comprehension  Verbalized understanding          PT Long Term Goals - 03/29/18 1630      PT LONG TERM GOAL #1   Title  independent with HEP    Baseline  verbalized independence    Status  Achieved      PT LONG TERM GOAL #2   Title  perform cervical ROM without increase in pain for improved function    Baseline  able without pain    Status  Achieved      PT LONG TERM GOAL #3   Title  demonstrate improved postural awareness in clinic at least 50% of the time    Baseline  Patient feels she is able to do this in her clinic and she was observed to do this in out clinic today.    Status  Achieved      PT LONG TERM GOAL #4   Title  report pain < 3/10 for improved function and pain    Baseline  no pain    Status  Achieved            Plan - 03/29/18 1646    Clinical Impression Statement  pt has met all of her goals at this time and verbalized readiness to d/c. Encouraged her to continue HEP to avoid return of pain and contact us with  any further questions.     PT Treatment/Interventions  ADLs/Self Care Home Management;Cryotherapy;Electrical Stimulation;Ultrasound;Traction;Moist Heat;Functional mobility training;Therapeutic activities;Therapeutic exercise;Patient/family education;Neuromuscular re-education;Manual techniques;Taping;Dry needling    PT Home Exercise Plan  cervical & scapular retraction, upper trap & levator stretch, ER red tband, abd behind back red tband,  supine scapular stabilization series; wall walks, flexion+abd band; supine horiz abd, sidelying horiz abd, qped GHJ flx    Consulted and Agree with Plan of Care  Patient       Patient will benefit from skilled therapeutic intervention in order to improve the following deficits and impairments:  Increased fascial restricitons, Increased muscle spasms, Pain, Postural dysfunction, Impaired flexibility, Decreased strength, Decreased range of motion, Impaired UE functional use  Visit Diagnosis: Cervicalgia  Abnormal posture  Pain in left hip  Difficulty in walking, not elsewhere classified     Problem List Patient Active Problem List   Diagnosis Date Noted  . Impingement syndrome of right shoulder 05/16/2017  . Special screening for malignant neoplasms, colon 11/28/2011  . Diverticulosis of colon (without mention of hemorrhage) 11/28/2011  . Abdominal pain 11/15/2011  . Polyp of gallbladder 11/15/2011   PHYSICAL THERAPY DISCHARGE SUMMARY  Visits from Start of Care: 8  Current functional level related to goals / functional outcomes: See above   Remaining deficits: See above   Education / Equipment: Anatomy of condition POC, HEP, exercise form/rationale  Plan: Patient agrees to discharge.  Patient goals were met. Patient is being discharged due to meeting the stated rehab goals.  ?????     Roselia Snipe C. Riordan Walle PT, DPT 03/29/18 4:49 PM   [facility] [address] [place], [place],  [address] Phone: [phone]   Fax:  [phone]  Name: Julia Kim MRN: [id number] Date of Birth: 1956-06-22

## 2018-05-28 ENCOUNTER — Other Ambulatory Visit: Payer: Self-pay | Admitting: Family Medicine

## 2018-05-28 DIAGNOSIS — Z1231 Encounter for screening mammogram for malignant neoplasm of breast: Secondary | ICD-10-CM

## 2018-06-18 ENCOUNTER — Ambulatory Visit
Admission: RE | Admit: 2018-06-18 | Discharge: 2018-06-18 | Disposition: A | Discharge status: Home / Self Care | Payer: 59 | Source: Ambulatory Visit | Attending: Family Medicine | Admitting: Family Medicine

## 2018-06-18 DIAGNOSIS — Z1231 Encounter for screening mammogram for malignant neoplasm of breast: Secondary | ICD-10-CM

## 2018-06-20 MED FILL — AMOXICILLIN 500 MG CAPSULE: 500 | 7 days supply | Qty: 21 | Fill #0

## 2018-06-20 MED FILL — HYDROCODON-APAP 5-325: 5-325 | 3 days supply | Qty: 12 | Fill #0

## 2018-06-20 MED FILL — IBUPROFEN 800 MG TAB: 800 | 5 days supply | Qty: 20 | Fill #0

## 2018-06-29 ENCOUNTER — Other Ambulatory Visit: Payer: Self-pay | Admitting: Family Medicine

## 2018-06-29 ENCOUNTER — Other Ambulatory Visit (HOSPITAL_COMMUNITY)
Admission: RE | Admit: 2018-06-29 | Discharge: 2018-06-29 | Disposition: A | Discharge status: Home / Self Care | Payer: 59 | Source: Ambulatory Visit | Attending: Family Medicine | Admitting: Family Medicine

## 2018-06-29 DIAGNOSIS — L932 Other local lupus erythematosus: Secondary | ICD-10-CM | POA: Diagnosis not present

## 2018-06-29 DIAGNOSIS — Z0000 Encounter for general adult medical examination without abnormal findings: Secondary | ICD-10-CM | POA: Diagnosis not present

## 2018-06-29 DIAGNOSIS — Z01419 Encounter for gynecological examination (general) (routine) without abnormal findings: Secondary | ICD-10-CM | POA: Diagnosis not present

## 2018-06-29 DIAGNOSIS — Z1211 Encounter for screening for malignant neoplasm of colon: Secondary | ICD-10-CM | POA: Diagnosis not present

## 2018-06-29 DIAGNOSIS — E782 Mixed hyperlipidemia: Secondary | ICD-10-CM | POA: Diagnosis not present

## 2018-06-29 DIAGNOSIS — N951 Menopausal and female climacteric states: Secondary | ICD-10-CM | POA: Diagnosis not present

## 2018-06-29 DIAGNOSIS — I10 Essential (primary) hypertension: Secondary | ICD-10-CM | POA: Diagnosis not present

## 2018-06-29 DIAGNOSIS — M1811 Unilateral primary osteoarthritis of first carpometacarpal joint, right hand: Secondary | ICD-10-CM | POA: Diagnosis not present

## 2018-06-29 DIAGNOSIS — Z124 Encounter for screening for malignant neoplasm of cervix: Secondary | ICD-10-CM | POA: Diagnosis not present

## 2018-06-29 DIAGNOSIS — E559 Vitamin D deficiency, unspecified: Secondary | ICD-10-CM | POA: Diagnosis not present

## 2018-06-29 MED FILL — VIT D2 1.25 MG (50,000 UNIT: 1.25 MG | 90 days supply | Qty: 13 | Fill #0

## 2018-06-29 MED FILL — SIMVASTATIN 40 MG TABS: 40 | 90 days supply | Qty: 90 | Fill #0

## 2018-06-29 MED FILL — SPIRONOLACTONE 25 MG TABS: 25 | 90 days supply | Qty: 90 | Fill #0

## 2018-06-29 MED FILL — AMLODIPINE 2.5 MG TABLET: 2.5 | 90 days supply | Qty: 90 | Fill #0

## 2018-07-03 LAB — CYTOLOGY - PAP: Diagnosis: NEGATIVE

## 2018-08-03 DIAGNOSIS — H401131 Primary open-angle glaucoma, bilateral, mild stage: Secondary | ICD-10-CM | POA: Diagnosis not present

## 2018-10-01 MED FILL — AMLODIPINE 2.5 MG TABLET: 2.5 | 90 days supply | Qty: 90 | Fill #1

## 2018-10-01 MED FILL — SPIRONOLACTONE 25 MG TABS: 25 | 90 days supply | Qty: 90 | Fill #1

## 2018-10-01 MED FILL — SIMVASTATIN 40 MG TABS: 40 | 90 days supply | Qty: 90 | Fill #1

## 2018-10-01 MED FILL — VIT D2 1.25 MG (50,000 UNIT: 1.25 MG | 84 days supply | Qty: 12 | Fill #1

## 2018-11-20 DIAGNOSIS — R109 Unspecified abdominal pain: Secondary | ICD-10-CM | POA: Diagnosis not present

## 2018-11-20 DIAGNOSIS — R8290 Unspecified abnormal findings in urine: Secondary | ICD-10-CM | POA: Diagnosis not present

## 2019-01-10 DIAGNOSIS — R232 Flushing: Secondary | ICD-10-CM | POA: Diagnosis not present

## 2019-01-10 DIAGNOSIS — E559 Vitamin D deficiency, unspecified: Secondary | ICD-10-CM | POA: Diagnosis not present

## 2019-01-10 DIAGNOSIS — E782 Mixed hyperlipidemia: Secondary | ICD-10-CM | POA: Diagnosis not present

## 2019-01-10 DIAGNOSIS — L932 Other local lupus erythematosus: Secondary | ICD-10-CM | POA: Diagnosis not present

## 2019-01-10 DIAGNOSIS — I10 Essential (primary) hypertension: Secondary | ICD-10-CM | POA: Diagnosis not present

## 2019-01-10 DIAGNOSIS — M25512 Pain in left shoulder: Secondary | ICD-10-CM | POA: Diagnosis not present

## 2019-01-10 MED FILL — SPIRONOLACTONE 25 MG TABS: 25 | 90 days supply | Qty: 90 | Fill #0

## 2019-01-10 MED FILL — AMLODIPINE 2.5 MG TABLET: 2.5 | 90 days supply | Qty: 90 | Fill #0

## 2019-01-10 MED FILL — VIT D2 1.25 MG (50,000 UNIT: 1.25 MG | 84 days supply | Qty: 12 | Fill #0

## 2019-01-10 MED FILL — SIMVASTATIN 20 MG TABLET: 20 | 90 days supply | Qty: 90 | Fill #0

## 2019-01-18 DIAGNOSIS — M25512 Pain in left shoulder: Secondary | ICD-10-CM | POA: Diagnosis not present

## 2019-02-25 DIAGNOSIS — H401131 Primary open-angle glaucoma, bilateral, mild stage: Secondary | ICD-10-CM | POA: Diagnosis not present

## 2019-02-25 MED FILL — TRAVOPROST (BAK FREE) 0.004: 0.004 | 25 days supply | Qty: 3 | Fill #0

## 2019-02-25 MED FILL — SIMBRINZA 1%-0.2% EYE DROPS: 1-0.2 | 40 days supply | Qty: 8 | Fill #0

## 2019-02-27 DIAGNOSIS — M25512 Pain in left shoulder: Secondary | ICD-10-CM | POA: Diagnosis not present

## 2019-02-28 ENCOUNTER — Other Ambulatory Visit (HOSPITAL_COMMUNITY): Payer: Self-pay | Admitting: Orthopedic Surgery

## 2019-02-28 ENCOUNTER — Other Ambulatory Visit: Payer: Self-pay | Admitting: Orthopedic Surgery

## 2019-02-28 DIAGNOSIS — M25512 Pain in left shoulder: Secondary | ICD-10-CM

## 2019-03-09 ENCOUNTER — Encounter (HOSPITAL_COMMUNITY): Payer: Self-pay

## 2019-03-09 ENCOUNTER — Ambulatory Visit (HOSPITAL_COMMUNITY): Admission: RE | Admit: 2019-03-09 | Payer: 59 | Source: Ambulatory Visit

## 2019-03-11 DIAGNOSIS — E782 Mixed hyperlipidemia: Secondary | ICD-10-CM | POA: Diagnosis not present

## 2019-03-11 DIAGNOSIS — M5432 Sciatica, left side: Secondary | ICD-10-CM | POA: Diagnosis not present

## 2019-03-11 DIAGNOSIS — I10 Essential (primary) hypertension: Secondary | ICD-10-CM | POA: Diagnosis not present

## 2019-03-19 DIAGNOSIS — R202 Paresthesia of skin: Secondary | ICD-10-CM | POA: Diagnosis not present

## 2019-03-20 ENCOUNTER — Encounter: Payer: Self-pay | Admitting: Neurology

## 2019-03-20 ENCOUNTER — Encounter: Payer: Self-pay | Admitting: *Deleted

## 2019-03-20 ENCOUNTER — Other Ambulatory Visit: Payer: Self-pay

## 2019-03-20 ENCOUNTER — Ambulatory Visit: Payer: 59 | Admitting: Neurology

## 2019-03-20 VITALS — BP 165/75 | HR 56 | Temp 97.3°F | Ht 64.0 in | Wt 136.5 lb

## 2019-03-20 DIAGNOSIS — R200 Anesthesia of skin: Secondary | ICD-10-CM | POA: Insufficient documentation

## 2019-03-20 DIAGNOSIS — G629 Polyneuropathy, unspecified: Secondary | ICD-10-CM | POA: Diagnosis not present

## 2019-03-20 DIAGNOSIS — R269 Unspecified abnormalities of gait and mobility: Secondary | ICD-10-CM

## 2019-03-20 NOTE — Progress Notes (Signed)
[facility]  PATIENT: Julia Kim DOB: 29-May-1956  REFERRING DOCTOR OR PCP:  Antony Contras  SOURCE: patient, notes from PCP  _________________________________   HISTORICAL  CHIEF COMPLAINT:  Chief Complaint  Patient presents with  . New Patient (Initial Visit)    RM 12, alone. Paper referral from Dr. Moreen Fowler for ting/numb in legs and feet. Did telephone call with PCP on 03/19/19. She was treated for sciatica-left with steroids around 03/11/19. Pain resolved but then she developed numb/ting in legs and feet. This is waking her up from her sleep. Also has a burning sensation.    HISTORY OF PRESENT ILLNESS:  I had the pleasure of seeing your patient, Julia Kim, [facility] for neurologic consultation regarding her recent onset numbness.  She is a 63 year old woman who was experiencing pain in the back of her left leg about 2 weeks ago.   She received a prednisone pack and pain improved.    Two nights ago, she began to note tingling in both legs, mostly from her knees to her feet.   Yesterday symptoms of tingling continued and she spoke to Dr. Moreen Fowler who set up this appointment.   Symptoms are worse with laying down or sitting but are better with walking      Currently she is experiencing a sensation like water running down her legs from thighs to feet but nothing in fingers, arm or face.   Initially, this morning, the left side was worse but since driving to the appointment, she feels symptoms are more symmetric.   She denies any weakness in her legs or arms.    She notes no change in her bladder.   She notes no change in coordination or balance.    She did not have to hold on to the wall when she shampooed her hair.    Compared to yesterday, she feels that the numbness and tingling has improved though she is not near baseline.  She has never had similar symptoms in the past.      She has some neck pain and left shoulder pain but this pain has  come and gone over the last year and has not changed.    She saw Orthopedics and she did PT last year.     She has well controlled hypertension and hyperlipidemia.    REVIEW OF SYSTEMS: Constitutional: No fevers, chills, sweats, or change in appetite Eyes: No visual changes, double vision, eye pain Ear, nose and throat: No hearing loss, ear pain, nasal congestion, sore throat Cardiovascular: No chest pain, palpitations Respiratory: No shortness of breath at rest or with exertion.   No wheezes GastrointestinaI: No nausea, vomiting, diarrhea, abdominal pain, fecal incontinence Genitourinary: No dysuria, urinary retention or frequency.  No nocturia. Musculoskeletal: No neck pain, back pain Integumentary: No rash, pruritus, skin lesions Neurological: as above Psychiatric: No depression at this time.  No anxiety Endocrine: No palpitations, diaphoresis, change in appetite, change in weigh or increased thirst Hematologic/Lymphatic: No anemia, purpura, petechiae. Allergic/Immunologic: No itchy/runny eyes, nasal congestion, recent allergic reactions, rashes  ALLERGIES: Allergies  Allergen Reactions  . Aspirin     dyspepia with higher doses     HOME MEDICATIONS:  Current Outpatient Medications:  .  amLODipine (NORVASC) 5 MG tablet, Take 5 mg by mouth daily. , Disp: , Rfl: 3 .  omeprazole (PRILOSEC OTC) 20 MG tablet, Take 20 mg by mouth daily as needed.  , Disp: , Rfl:  .  simvastatin (ZOCOR) 40 MG  tablet, Take 20 mg by mouth at bedtime. , Disp: , Rfl:  .  spironolactone (ALDACTONE) 25 MG tablet, Take 25 mg by mouth daily., Disp: , Rfl:  .  Vitamin D, Ergocalciferol, (DRISDOL) 50000 UNITS CAPS, Take 50,000 Units by mouth every 7 (seven) days.  , Disp: , Rfl:   PAST MEDICAL HISTORY: Past Medical History:  Diagnosis Date  . Allergic sinusitis   . Allergy   . Cutaneous lupus erythematosus   . Elevated blood pressure (not hypertension)   . Flushing   . Full dentures   . GERD  (gastroesophageal reflux disease) 06/29/2005   EGD ,( Dr. Sharlett Iles)  . Glaucoma   . Hypercholesterolemia   . Hyperlipidemia   . Hypertension   . Stenosing tenosynovitis of finger    R  Thumb  . Unspecified vitamin D deficiency   . Wears glasses     PAST SURGICAL HISTORY: Past Surgical History:  Procedure Laterality Date  . CESAREAN SECTION  1975  . COLONOSCOPY    . FOOT SURGERY  2002   Right   . TRIGGER FINGER RELEASE Right 09/15/2017   Procedure: RELEASE TRIGGER FINGER/A-1 PULLEY RIGHT THUMB;  Surgeon: Daryll Brod, MD;  Location: [place];  Service: Orthopedics;  Laterality: Right;  Bier block  . ULNAR NERVE TRANSPOSITION Right 09/20/2013   Procedure: RIGHT ULNAR NERVE DECOMPRESSION;  Surgeon: Wynonia Sours, MD;  Location: [place];  Service: Orthopedics;  Laterality: Right;  . UPPER GASTROINTESTINAL ENDOSCOPY    . UPPER GI ENDOSCOPY      FAMILY HISTORY: Family History  Problem Relation Age of Onset  . Heart disease Mother   . Hypertension Mother   . Kidney disease Mother   . Hypertension Father   . Stroke Maternal Grandmother   . Colon cancer Neg Hx     SOCIAL HISTORY:  Social History   Socioeconomic History  . Marital status: Married    Spouse name: Rosanna Randy  . Number of children: 1  . Years of education: 70  . Highest education level: Not on file  Occupational History  . Occupation: Artist: [employer]    Employer: [employer]  . Financial resource strain: Not on file  . Food insecurity:    Worry: Not on file    Inability: Not on file  . Transportation needs:    Medical: Not on file    Non-medical: Not on file  Tobacco Use  . Smoking status: Former Smoker    Last attempt to quit: 12/19/1992    Years since quitting: 26.2  . Smokeless tobacco: Never Used  Substance and Sexual Activity  . Alcohol use: Yes    Comment: social  . Drug use: No  . Sexual activity: Not on file  Lifestyle  .  Physical activity:    Days per week: Not on file    Minutes per session: Not on file  . Stress: Not on file  Relationships  . Social connections:    Talks on phone: Not on file    Gets together: Not on file    Attends religious service: Not on file    Active member of club or organization: Not on file    Attends meetings of clubs or organizations: Not on file    Relationship status: Not on file  . Intimate partner violence:    Fear of current or ex partner: Not on file    Emotionally abused: Not on  file    Physically abused: Not on file    Forced sexual activity: Not on file  Other Topics Concern  . Not on file  Social History Narrative   Lives with husband   Caffeine use: 1 caffeine drink daily    Left handed      PHYSICAL EXAM  Vitals:   03/20/19 1304  BP: (!) 165/75  Pulse: (!) 56  Temp: (!) 97.3 F (36.3 C)  Weight: 136 lb 8 oz (61.9 kg)  Height: _0  (1.626 m)    Body mass index is 23.43 kg/m.   General: The patient is well-developed and well-nourished and in no acute distress  Eyes:  Funduscopic exam shows normal optic discs and retinal vessels.  Neck: The neck is supple, no carotid bruits are noted.  The neck is nontender.  Cardiovascular: The heart has a regular rate and rhythm with a normal S1 and S2. There were no murmurs, gallops or rubs. Lungs are clear to auscultation.  Skin: Extremities are without significant edema.  Musculoskeletal:  Back is nontender  Neurologic Exam  Mental status: The patient is alert and oriented x 3 at the time of the examination. The patient has apparent normal recent and remote memory, with an apparently normal attention span and concentration ability.   Speech is normal.  Cranial nerves: Extraocular movements are full. Pupils are equal, round, and reactive to light and accomodation.  Visual fields are full.  Facial symmetry is present. There is good facial sensation to soft touch bilaterally.Facial strength is normal.   Trapezius and sternocleidomastoid strength is normal. No dysarthria is noted.  The tongue is midline, and the patient has symmetric elevation of the soft palate. No obvious hearing deficits are noted.  Motor:  Muscle bulk is normal.   Tone is normal. Strength is  5 / 5 in all 4 extremities.   Sensory: Sensory testing is intact to pinprick, soft touch and vibration sensation in arms and proximal legs but reduced vibration sensation (to about 25%).   Pinprick was more normal in feet.     Coordination: Cerebellar testing reveals good finger-nose-finger and heel-to-shin bilaterally.  Gait and station: Station is normal.   Gait is normal. Tandem gait is mildly wide . Romberg is negative.   Reflexes: Deep tendon reflexes are symmetric but brisk in legs (3+ with spread at the knees but no ankle clonus).   Plantar responses are flexor.    DIAGNOSTIC DATA (LABS, IMAGING, TESTING) - I reviewed patient records, labs, notes, testing and imaging myself where available.  Lab Results  Component Value Date   WBC 4.4 (L) 04/08/2014   HGB 14.1 04/08/2014   HCT 41.2 04/08/2014   MCV 88.8 04/08/2014   PLT 201.0 04/08/2014      Component Value Date/Time   NA 140 09/13/2017 0925   K 4.0 09/13/2017 0925   CL 106 09/13/2017 0925   CO2 28 09/13/2017 0925   GLUCOSE 78 09/13/2017 0925   BUN 8 09/13/2017 0925   CREATININE 0.90 09/13/2017 0925   CALCIUM 9.6 09/13/2017 0925   GFRNONAA >60 09/13/2017 0925   GFRAA >60 09/13/2017 0925       ASSESSMENT AND PLAN  Polyneuropathy - Plan: Vitamin B12, Multiple Myeloma Panel (SPEP&IFE w/QIG), Rheumatoid factor, Sedimentation rate, Sjogren's syndrome antibods(ssa + ssb), Cryoglobulin  Numbness  Gait disturbance   In summary, Ms. Preece is a 63 year old woman who had the onset of bilateral leg numbness that began about a week after she was experiencing  pain and tingling in the left leg.  Additionally, on examination she has hyperreflexia in the legs and  her tandem gait was mildly wide which she felt was new.  Compared to yesterday, she feels she is a little better.  The etiology is uncertain.  The stability or actual improvement compared to yesterday make a serious polyneuropathy such as Guillain Barr syndrome less likely.  Additionally, reflexes are increased at the knees rather than decreased.   Although the reflex findings could be incidental, combined with her symptoms,  we would need to also consider a myelopathy.  1.   Check labs for metabolic or inflammatory polyneuropathy   --- B12, SPEP, RF, SSA/SSB, cryoglobulins. 2.   If not better or worsening, we would need to check an MRI of the cervical and thoracic spine to rule out a myelopathy, compressive or intrinsic.  Since she does not have any bladder symptoms and she is better today than yesterday, I will hold off on doing so at this time. 3.   She is advised to call us if she notes worsening or to let us know if no better after a few more days.  We will order the additional tests and also consider an EMG/NCV.  Additionally would consider gabapentin if the tingling becomes more painful.  Thank you for asking me to see Ms. Pennie.  Please let me know if I will be of further assistance with her or other patients in the future.  Bria Sparr A. Felecia Shelling, MD, [facility] 03/21/2002, 7:94 PM Certified in Neurology, Clinical Neurophysiology, Sleep Medicine, Pain Medicine and Neuroimaging  [facility] [address] [place], [place] [address] [phone]

## 2019-03-25 ENCOUNTER — Telehealth: Payer: Self-pay | Admitting: *Deleted

## 2019-03-25 LAB — SEDIMENTATION RATE: Sed Rate: 12 mm/hr (ref 0–40)

## 2019-03-25 LAB — SJOGREN'S SYNDROME ANTIBODS(SSA + SSB)
ENA SSA (RO) Ab: <0.2 AI (ref 0.0–0.9)
ENA SSB (LA) Ab: <0.2 AI (ref 0.0–0.9)

## 2019-03-25 LAB — MULTIPLE MYELOMA PANEL, SERUM
Albumin SerPl Elph-Mcnc: 4.1 g/dL (ref 2.9–4.4)
Albumin/Glob SerPl: 1.5 (ref 0.7–1.7)
Alpha 1: 0.2 g/dL (ref 0.0–0.4)
Alpha2 Glob SerPl Elph-Mcnc: 0.7 g/dL (ref 0.4–1.0)
B-Globulin SerPl Elph-Mcnc: 1 g/dL (ref 0.7–1.3)
Gamma Glob SerPl Elph-Mcnc: 0.9 g/dL (ref 0.4–1.8)
Globulin, Total: 2.9 g/dL (ref 2.2–3.9)
IgA/Immunoglobulin A, Serum: 246 mg/dL (ref 87–352)
IgG (Immunoglobin G), Serum: 985 mg/dL (ref 586–1602)
IgM (Immunoglobulin M), Srm: 87 mg/dL (ref 26–217)
Total Protein: 7 g/dL (ref 6.0–8.5)

## 2019-03-25 LAB — VITAMIN B12: Vitamin B-12: 537 pg/mL (ref 232–1245)

## 2019-03-25 LAB — RHEUMATOID FACTOR: Rheumatoid fact SerPl-aCnc: 10.2 IU/mL (ref 0.0–13.9)

## 2019-03-25 LAB — CRYOGLOBULIN

## 2019-03-25 NOTE — Telephone Encounter (Signed)
I called and spoke with patient. Advised lab work is fine per Dr. Epimenio Foot. She verbalized understanding.  She denies any tingling/burning/pain right now. She will call back if she has new or worsening sx.

## 2019-03-25 NOTE — Telephone Encounter (Signed)
-----   Message from Asa Lente, MD sent at 03/25/2019  9:01 AM EDT ----- Please let the patient know that the lab work is fine.

## 2019-04-09 MED FILL — AMLODIPINE 2.5 MG TABLET: 2.5 | 90 days supply | Qty: 90 | Fill #1

## 2019-04-09 MED FILL — SPIRONOLACTONE 25 MG TABS: 25 | 90 days supply | Qty: 90 | Fill #1

## 2019-04-13 ENCOUNTER — Ambulatory Visit (HOSPITAL_COMMUNITY)
Admission: RE | Admit: 2019-04-13 | Discharge: 2019-04-13 | Disposition: A | Discharge status: Home / Self Care | Payer: 59 | Source: Ambulatory Visit | Attending: Orthopedic Surgery | Admitting: Orthopedic Surgery

## 2019-04-13 ENCOUNTER — Other Ambulatory Visit: Payer: Self-pay

## 2019-04-13 DIAGNOSIS — M25512 Pain in left shoulder: Secondary | ICD-10-CM | POA: Diagnosis present

## 2019-04-13 DIAGNOSIS — M7552 Bursitis of left shoulder: Secondary | ICD-10-CM | POA: Diagnosis not present

## 2019-04-13 DIAGNOSIS — M75112 Incomplete rotator cuff tear or rupture of left shoulder, not specified as traumatic: Secondary | ICD-10-CM | POA: Diagnosis not present

## 2019-04-17 DIAGNOSIS — M75112 Incomplete rotator cuff tear or rupture of left shoulder, not specified as traumatic: Secondary | ICD-10-CM | POA: Diagnosis not present

## 2019-05-02 DIAGNOSIS — M75112 Incomplete rotator cuff tear or rupture of left shoulder, not specified as traumatic: Secondary | ICD-10-CM | POA: Diagnosis not present

## 2019-05-07 DIAGNOSIS — M75112 Incomplete rotator cuff tear or rupture of left shoulder, not specified as traumatic: Secondary | ICD-10-CM | POA: Diagnosis not present

## 2019-05-09 DIAGNOSIS — M75112 Incomplete rotator cuff tear or rupture of left shoulder, not specified as traumatic: Secondary | ICD-10-CM | POA: Diagnosis not present

## 2019-05-15 DIAGNOSIS — M65312 Trigger thumb, left thumb: Secondary | ICD-10-CM | POA: Diagnosis not present

## 2019-05-15 DIAGNOSIS — M75112 Incomplete rotator cuff tear or rupture of left shoulder, not specified as traumatic: Secondary | ICD-10-CM | POA: Diagnosis not present

## 2019-05-16 ENCOUNTER — Other Ambulatory Visit: Payer: Self-pay | Admitting: Orthopedic Surgery

## 2019-05-21 ENCOUNTER — Telehealth: Payer: Self-pay | Admitting: Neurology

## 2019-05-21 DIAGNOSIS — R292 Abnormal reflex: Secondary | ICD-10-CM

## 2019-05-21 DIAGNOSIS — M79604 Pain in right leg: Secondary | ICD-10-CM

## 2019-05-21 DIAGNOSIS — M79605 Pain in left leg: Secondary | ICD-10-CM

## 2019-05-21 NOTE — Telephone Encounter (Signed)
Pt called in and stated she is experiencing some leg pain again from her back all the way down to her leg, she stated she was advised from her last appt to contact if it came back or got worse.

## 2019-05-21 NOTE — Telephone Encounter (Signed)
Due to her leg pain and hyperreflexia, I would like to order: MRI cervical, thoracic and lumbar   All without contrast

## 2019-05-21 NOTE — Telephone Encounter (Signed)
Late entry from earlier this am: I called pt and relayed Dr. Bonnita Hollow recommendation. She is agreeable to plan. She is requesting to go to [facility]/open MRI. I placed orders. Advised she will be called to schedule MRI's once insurance approves. She verbalized understanding.  She is scheduled to have shoulder surgery on 06/17/2019.

## 2019-05-22 ENCOUNTER — Telehealth: Payer: Self-pay | Admitting: Neurology

## 2019-05-22 NOTE — Telephone Encounter (Signed)
Patient is scheduled at [facility] for 05/31/19.

## 2019-05-22 NOTE — Telephone Encounter (Signed)
[facility] no auth. Patient is scheduled at [facility] for 06/11/19 arrival time is 11:30 AM. Patient is aware of time and day. But she was hoping to get a sooner appt. I informed her that I could fax the order to [facility] because they actually have a true open MRI and they may be able to get her in sooner.. she stated she wanted to try that and if she got a sooner appt she would call [facility] to cancel her appt's for 06/11/19. I also gave her their number of [phone].

## 2019-05-23 NOTE — Telephone Encounter (Signed)
[facility] Auth: NPR spoke to [facility] Ref # [id number]. I informed Liza at [facility] about this. Her number is [phone].

## 2019-05-31 DIAGNOSIS — M4802 Spinal stenosis, cervical region: Secondary | ICD-10-CM | POA: Diagnosis not present

## 2019-05-31 DIAGNOSIS — R292 Abnormal reflex: Secondary | ICD-10-CM | POA: Diagnosis not present

## 2019-05-31 DIAGNOSIS — M4807 Spinal stenosis, lumbosacral region: Secondary | ICD-10-CM | POA: Diagnosis not present

## 2019-06-10 ENCOUNTER — Telehealth: Payer: Self-pay | Admitting: Neurology

## 2019-06-10 NOTE — Telephone Encounter (Signed)
Pt called wanting to know if her MRI results have come in. Please advise. 

## 2019-06-11 ENCOUNTER — Other Ambulatory Visit (HOSPITAL_COMMUNITY): Payer: 59

## 2019-06-11 ENCOUNTER — Ambulatory Visit (HOSPITAL_COMMUNITY): Payer: 59

## 2019-06-11 NOTE — Telephone Encounter (Signed)
Done waiting on the report.

## 2019-06-11 NOTE — Telephone Encounter (Signed)
I have returned the call to the patient.  She is aware we are working on her request and will call her back with the results.

## 2019-06-12 NOTE — Telephone Encounter (Signed)
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

## 2019-06-13 ENCOUNTER — Other Ambulatory Visit: Payer: Self-pay

## 2019-06-13 ENCOUNTER — Encounter (HOSPITAL_BASED_OUTPATIENT_CLINIC_OR_DEPARTMENT_OTHER): Payer: Self-pay | Admitting: *Deleted

## 2019-06-13 ENCOUNTER — Other Ambulatory Visit (HOSPITAL_COMMUNITY)
Admission: RE | Admit: 2019-06-13 | Discharge: 2019-06-13 | Disposition: A | Discharge status: Home / Self Care | Payer: 59 | Source: Ambulatory Visit | Attending: Orthopedic Surgery | Admitting: Orthopedic Surgery

## 2019-06-13 DIAGNOSIS — Z1159 Encounter for screening for other viral diseases: Secondary | ICD-10-CM | POA: Insufficient documentation

## 2019-06-13 LAB — SARS CORONAVIRUS 2 (TAT 6-24 HRS): SARS Coronavirus 2: NEGATIVE

## 2019-06-13 NOTE — Telephone Encounter (Signed)
I have spoken with the patient and provided her with the MRI results below.  Reports having a past abnormal cervical MRI (when treated at [facility] for neck pain).  She would like to know what Dr. Felecia Shelling feels is the next step based on these findings.  She has left rotator cuff repair surgery scheduled at 2pm on 06/17/2019. She may not be available by phone but says it is okay to leave a detailed message on her voicemail.

## 2019-06-13 NOTE — Progress Notes (Signed)

## 2019-06-13 NOTE — Progress Notes (Signed)
Spoke w/ pt via phone for pre-op interview.  Npo after mn w/ exception clear liquid diet until 1115,  which at this time to finish pre-surgery ensure drink, pt verbalized understanding (pt picked up drink w/ instructions at covid testing site).  Pt had covid test today .  Will take norvasc and prilosec am dos w/ sips of water.  Needs istat 8 and ekg.

## 2019-06-17 ENCOUNTER — Encounter (HOSPITAL_BASED_OUTPATIENT_CLINIC_OR_DEPARTMENT_OTHER)
Admission: RE | Disposition: A | Discharge status: Home / Self Care | Payer: Self-pay | Source: Home / Self Care | Attending: Orthopedic Surgery

## 2019-06-17 ENCOUNTER — Ambulatory Visit (HOSPITAL_BASED_OUTPATIENT_CLINIC_OR_DEPARTMENT_OTHER): Payer: 59 | Admitting: Anesthesiology

## 2019-06-17 ENCOUNTER — Ambulatory Visit (HOSPITAL_BASED_OUTPATIENT_CLINIC_OR_DEPARTMENT_OTHER)
Admission: RE | Admit: 2019-06-17 | Discharge: 2019-06-17 | Disposition: A | Discharge status: Home / Self Care | Payer: 59 | Attending: Orthopedic Surgery | Admitting: Orthopedic Surgery

## 2019-06-17 ENCOUNTER — Other Ambulatory Visit: Payer: Self-pay

## 2019-06-17 ENCOUNTER — Encounter (HOSPITAL_BASED_OUTPATIENT_CLINIC_OR_DEPARTMENT_OTHER): Payer: Self-pay

## 2019-06-17 DIAGNOSIS — Z823 Family history of stroke: Secondary | ICD-10-CM | POA: Insufficient documentation

## 2019-06-17 DIAGNOSIS — L932 Other local lupus erythematosus: Secondary | ICD-10-CM | POA: Diagnosis not present

## 2019-06-17 DIAGNOSIS — Z79899 Other long term (current) drug therapy: Secondary | ICD-10-CM | POA: Insufficient documentation

## 2019-06-17 DIAGNOSIS — G709 Myoneural disorder, unspecified: Secondary | ICD-10-CM | POA: Diagnosis not present

## 2019-06-17 DIAGNOSIS — H409 Unspecified glaucoma: Secondary | ICD-10-CM | POA: Insufficient documentation

## 2019-06-17 DIAGNOSIS — M7542 Impingement syndrome of left shoulder: Secondary | ICD-10-CM | POA: Diagnosis present

## 2019-06-17 DIAGNOSIS — G8918 Other acute postprocedural pain: Secondary | ICD-10-CM | POA: Diagnosis not present

## 2019-06-17 DIAGNOSIS — K449 Diaphragmatic hernia without obstruction or gangrene: Secondary | ICD-10-CM | POA: Insufficient documentation

## 2019-06-17 DIAGNOSIS — Z8249 Family history of ischemic heart disease and other diseases of the circulatory system: Secondary | ICD-10-CM | POA: Insufficient documentation

## 2019-06-17 DIAGNOSIS — M75112 Incomplete rotator cuff tear or rupture of left shoulder, not specified as traumatic: Secondary | ICD-10-CM | POA: Insufficient documentation

## 2019-06-17 DIAGNOSIS — G629 Polyneuropathy, unspecified: Secondary | ICD-10-CM | POA: Diagnosis not present

## 2019-06-17 DIAGNOSIS — I10 Essential (primary) hypertension: Secondary | ICD-10-CM | POA: Diagnosis not present

## 2019-06-17 DIAGNOSIS — Z886 Allergy status to analgesic agent status: Secondary | ICD-10-CM | POA: Diagnosis not present

## 2019-06-17 DIAGNOSIS — E559 Vitamin D deficiency, unspecified: Secondary | ICD-10-CM | POA: Insufficient documentation

## 2019-06-17 DIAGNOSIS — Z841 Family history of disorders of kidney and ureter: Secondary | ICD-10-CM | POA: Insufficient documentation

## 2019-06-17 DIAGNOSIS — M7541 Impingement syndrome of right shoulder: Secondary | ICD-10-CM | POA: Diagnosis not present

## 2019-06-17 DIAGNOSIS — E785 Hyperlipidemia, unspecified: Secondary | ICD-10-CM | POA: Insufficient documentation

## 2019-06-17 DIAGNOSIS — Z87891 Personal history of nicotine dependence: Secondary | ICD-10-CM | POA: Insufficient documentation

## 2019-06-17 DIAGNOSIS — K219 Gastro-esophageal reflux disease without esophagitis: Secondary | ICD-10-CM | POA: Diagnosis not present

## 2019-06-17 HISTORY — DX: Incomplete rotator cuff tear or rupture of left shoulder, not specified as traumatic: M75.112

## 2019-06-17 HISTORY — DX: Diaphragmatic hernia without obstruction or gangrene: K44.9

## 2019-06-17 HISTORY — DX: Vitamin D deficiency, unspecified: E55.9

## 2019-06-17 HISTORY — PX: SHOULDER ARTHROSCOPY WITH OPEN ROTATOR CUFF REPAIR: SHX6092

## 2019-06-17 HISTORY — DX: Unspecified glaucoma: H40.9

## 2019-06-17 LAB — POCT I-STAT, CHEM 8
BUN: 9 mg/dL (ref 8–23)
Calcium, Ion: 1.23 mmol/L (ref 1.15–1.40)
Chloride: 103 mmol/L (ref 98–111)
Creatinine, Ser: 0.7 mg/dL (ref 0.44–1.00)
Glucose, Bld: 124 mg/dL — ABNORMAL HIGH (ref 70–99)
HCT: 47 % — ABNORMAL HIGH (ref 36.0–46.0)
Hemoglobin: 16 g/dL — ABNORMAL HIGH (ref 12.0–15.0)
Potassium: 4.5 mmol/L (ref 3.5–5.1)
Sodium: 141 mmol/L (ref 135–145)
TCO2: 31 mmol/L (ref 22–32)

## 2019-06-17 SURGERY — ARTHROSCOPY, SHOULDER WITH REPAIR, ROTATOR CUFF, OPEN
Anesthesia: General | Site: Shoulder | Laterality: Left

## 2019-06-17 MED ORDER — ONDANSETRON HCL 4 MG/2ML IJ SOLN
4.0000 mg | Freq: Once | INTRAMUSCULAR | Status: AC | PRN
  Administered 2019-06-17: 4 mg via INTRAVENOUS
  Filled 2019-06-17: qty 2

## 2019-06-17 MED ORDER — EPHEDRINE SULFATE-NACL 50-0.9 MG/10ML-% IV SOSY
PREFILLED_SYRINGE | INTRAVENOUS | Status: DC | PRN
  Administered 2019-06-17: 10 mg via INTRAVENOUS

## 2019-06-17 MED ORDER — SUGAMMADEX SODIUM 200 MG/2ML IV SOLN
INTRAVENOUS | Status: AC
  Filled 2019-06-17: qty 2

## 2019-06-17 MED ORDER — ACETAMINOPHEN 325 MG PO TABS
325.0000 mg | ORAL_TABLET | ORAL | Status: DC | PRN
  Filled 2019-06-17: qty 2

## 2019-06-17 MED ORDER — MIDAZOLAM HCL 2 MG/2ML IJ SOLN
INTRAMUSCULAR | Status: AC
  Filled 2019-06-17: qty 2

## 2019-06-17 MED ORDER — OXYCODONE-ACETAMINOPHEN 5-325 MG PO TABS: 1.0000 | ORAL_TABLET | ORAL | 0 refills | Status: DC | PRN

## 2019-06-17 MED ORDER — ROCURONIUM BROMIDE 10 MG/ML (PF) SYRINGE
PREFILLED_SYRINGE | INTRAVENOUS | Status: AC
  Filled 2019-06-17: qty 10

## 2019-06-17 MED ORDER — BUPIVACAINE-EPINEPHRINE (PF) 0.5% -1:200000 IJ SOLN
INTRAMUSCULAR | Status: DC | PRN
  Administered 2019-06-17: 20 mL via PERINEURAL

## 2019-06-17 MED ORDER — CHLORHEXIDINE GLUCONATE 4 % EX LIQD
60.0000 mL | Freq: Once | CUTANEOUS | Status: DC
  Filled 2019-06-17: qty 118

## 2019-06-17 MED ORDER — CEFAZOLIN SODIUM-DEXTROSE 2-4 GM/100ML-% IV SOLN
INTRAVENOUS | Status: AC
  Filled 2019-06-17: qty 100

## 2019-06-17 MED ORDER — ONDANSETRON HCL 4 MG/2ML IJ SOLN
INTRAMUSCULAR | Status: DC | PRN
  Administered 2019-06-17: 4 mg via INTRAVENOUS

## 2019-06-17 MED ORDER — EPHEDRINE 5 MG/ML INJ
INTRAVENOUS | Status: AC
  Filled 2019-06-17: qty 10

## 2019-06-17 MED ORDER — CEFAZOLIN SODIUM-DEXTROSE 2-4 GM/100ML-% IV SOLN
2.0000 g | INTRAVENOUS | Status: AC
  Administered 2019-06-17: 2 g via INTRAVENOUS
  Filled 2019-06-17: qty 100

## 2019-06-17 MED ORDER — BUPIVACAINE LIPOSOME 1.3 % IJ SUSP
INTRAMUSCULAR | Status: DC | PRN
  Administered 2019-06-17: 10 mL via PERINEURAL

## 2019-06-17 MED ORDER — FENTANYL CITRATE (PF) 100 MCG/2ML IJ SOLN
INTRAMUSCULAR | Status: AC
  Filled 2019-06-17: qty 2

## 2019-06-17 MED ORDER — PROPOFOL 10 MG/ML IV BOLUS
INTRAVENOUS | Status: DC | PRN
  Administered 2019-06-17: 120 mg via INTRAVENOUS

## 2019-06-17 MED ORDER — SODIUM CHLORIDE 0.9 % IR SOLN
Status: DC | PRN
  Administered 2019-06-17: 6000 mL

## 2019-06-17 MED ORDER — LIDOCAINE 2% (20 MG/ML) 5 ML SYRINGE
INTRAMUSCULAR | Status: AC
  Filled 2019-06-17: qty 5

## 2019-06-17 MED ORDER — DEXAMETHASONE SODIUM PHOSPHATE 10 MG/ML IJ SOLN
INTRAMUSCULAR | Status: DC | PRN
  Administered 2019-06-17: 5 mg via INTRAVENOUS

## 2019-06-17 MED ORDER — PROPOFOL 10 MG/ML IV BOLUS
INTRAVENOUS | Status: AC
  Filled 2019-06-17: qty 20

## 2019-06-17 MED ORDER — LACTATED RINGERS IV SOLN
INTRAVENOUS | Status: DC
  Administered 2019-06-17 (×2): via INTRAVENOUS
  Filled 2019-06-17: qty 1000

## 2019-06-17 MED ORDER — TIZANIDINE HCL 4 MG PO TABS: ORAL_TABLET | ORAL | 1 refills | Status: DC

## 2019-06-17 MED ORDER — OXYCODONE HCL 5 MG/5ML PO SOLN
5.0000 mg | Freq: Once | ORAL | Status: DC | PRN
  Filled 2019-06-17: qty 5

## 2019-06-17 MED ORDER — ONDANSETRON HCL 4 MG/2ML IJ SOLN
INTRAMUSCULAR | Status: AC
  Filled 2019-06-17: qty 2

## 2019-06-17 MED ORDER — FENTANYL CITRATE (PF) 100 MCG/2ML IJ SOLN
25.0000 ug | INTRAMUSCULAR | Status: DC | PRN
  Filled 2019-06-17: qty 1

## 2019-06-17 MED ORDER — SUGAMMADEX SODIUM 200 MG/2ML IV SOLN
INTRAVENOUS | Status: DC | PRN
  Administered 2019-06-17: 200 mg via INTRAVENOUS
  Administered 2019-06-17: 100 mg via INTRAVENOUS

## 2019-06-17 MED ORDER — ROCURONIUM BROMIDE 10 MG/ML (PF) SYRINGE
PREFILLED_SYRINGE | INTRAVENOUS | Status: DC | PRN
  Administered 2019-06-17: 50 mg via INTRAVENOUS

## 2019-06-17 MED ORDER — ARTIFICIAL TEARS OPHTHALMIC OINT
TOPICAL_OINTMENT | OPHTHALMIC | Status: AC
  Filled 2019-06-17: qty 3.5

## 2019-06-17 MED ORDER — ACETAMINOPHEN 160 MG/5ML PO SOLN
325.0000 mg | ORAL | Status: DC | PRN
  Filled 2019-06-17: qty 20.3

## 2019-06-17 MED ORDER — OXYCODONE HCL 5 MG PO TABS
5.0000 mg | ORAL_TABLET | Freq: Once | ORAL | Status: DC | PRN
  Filled 2019-06-17: qty 1

## 2019-06-17 MED ORDER — DEXAMETHASONE SODIUM PHOSPHATE 10 MG/ML IJ SOLN
INTRAMUSCULAR | Status: AC
  Filled 2019-06-17: qty 1

## 2019-06-17 MED ORDER — PHENYLEPHRINE 40 MCG/ML (10ML) SYRINGE FOR IV PUSH (FOR BLOOD PRESSURE SUPPORT)
PREFILLED_SYRINGE | INTRAVENOUS | Status: DC | PRN
  Administered 2019-06-17: 40 ug via INTRAVENOUS
  Administered 2019-06-17 (×2): 80 ug via INTRAVENOUS
  Administered 2019-06-17: 40 ug via INTRAVENOUS
  Administered 2019-06-17: 120 ug via INTRAVENOUS
  Administered 2019-06-17: 40 ug via INTRAVENOUS

## 2019-06-17 MED ORDER — MEPERIDINE HCL 25 MG/ML IJ SOLN
6.2500 mg | INTRAMUSCULAR | Status: DC | PRN
  Filled 2019-06-17: qty 1

## 2019-06-17 MED ORDER — LIDOCAINE 2% (20 MG/ML) 5 ML SYRINGE
INTRAMUSCULAR | Status: DC | PRN
  Administered 2019-06-17: 100 mg via INTRAVENOUS

## 2019-06-17 MED ORDER — FENTANYL CITRATE (PF) 100 MCG/2ML IJ SOLN
100.0000 ug | Freq: Once | INTRAMUSCULAR | Status: AC
  Administered 2019-06-17: 14:00:00 100 ug via INTRAVENOUS
  Filled 2019-06-17: qty 2

## 2019-06-17 MED ORDER — MIDAZOLAM HCL 2 MG/2ML IJ SOLN
2.0000 mg | Freq: Once | INTRAMUSCULAR | Status: AC
  Administered 2019-06-17: 2 mg via INTRAVENOUS
  Filled 2019-06-17: qty 2

## 2019-06-17 MED FILL — OXYCODONE-ACETAMINOPHEN 5-3: 5-325 | 4 days supply | Qty: 20 | Fill #0

## 2019-06-17 MED FILL — tiZANidine HCL 4 MG TABS: 4 | 10 days supply | Qty: 30 | Fill #0

## 2019-06-17 SURGICAL SUPPLY — 60 items
ANCHOR PEEK 4.75X19.1 SWLK C (Anchor) ×4 IMPLANT
BNDG COHESIVE 4X5 TAN STRL (GAUZE/BANDAGES/DRESSINGS) ×2 IMPLANT
BURR OVAL 8 FLU 4.0X13 (MISCELLANEOUS) IMPLANT
CANNULA 5.75X7 CRYSTAL CLEAR (CANNULA) ×2 IMPLANT
CANNULA TWIST IN 8.25X7CM (CANNULA) ×2 IMPLANT
CHLORAPREP W/TINT 26 (MISCELLANEOUS) IMPLANT
COVER WAND RF STERILE (DRAPES) IMPLANT
CUTTER BONE 4.0MM X 13CM (MISCELLANEOUS) ×2 IMPLANT
DECANTER SPIKE VIAL GLASS SM (MISCELLANEOUS) IMPLANT
DRAPE INCISE IOBAN 66X45 STRL (DRAPES) ×2 IMPLANT
DRAPE LG THREE QUARTER DISP (DRAPES) IMPLANT
DRAPE ORTHO SPLIT 87X125 STRL (DRAPES) ×4 IMPLANT
DRAPE SHOULDER BEACH CHAIR (DRAPES) ×2 IMPLANT
DRAPE STERI 35X30 U-POUCH (DRAPES) ×2 IMPLANT
DRAPE SURG 17X23 STRL (DRAPES) ×2 IMPLANT
DRAPE U-SHAPE 47X51 STRL (DRAPES) ×2 IMPLANT
DRAPE U-SHAPE 76X120 STRL (DRAPES) ×4 IMPLANT
DRSG PAD ABDOMINAL 8X10 ST (GAUZE/BANDAGES/DRESSINGS) ×2 IMPLANT
DURAPREP 26ML APPLICATOR (WOUND CARE) ×2 IMPLANT
ELECT REM PT RETURN 9FT ADLT (ELECTROSURGICAL)
ELECTRODE REM PT RTRN 9FT ADLT (ELECTROSURGICAL) IMPLANT
GAUZE SPONGE 4X4 12PLY STRL (GAUZE/BANDAGES/DRESSINGS) ×2 IMPLANT
GAUZE XEROFORM 1X8 LF (GAUZE/BANDAGES/DRESSINGS) ×2 IMPLANT
GLOVE BIO SURGEON STRL SZ 6.5 (GLOVE) ×2 IMPLANT
GLOVE BIO SURGEON STRL SZ7.5 (GLOVE) ×4 IMPLANT
GLOVE BIOGEL PI IND STRL 7.0 (GLOVE) ×2 IMPLANT
GLOVE BIOGEL PI IND STRL 8 (GLOVE) ×2 IMPLANT
GLOVE BIOGEL PI INDICATOR 7.0 (GLOVE) ×2
GLOVE BIOGEL PI INDICATOR 8 (GLOVE) ×2
GOWN STRL REUS W/ TWL LRG LVL3 (GOWN DISPOSABLE) ×3 IMPLANT
GOWN STRL REUS W/TWL LRG LVL3 (GOWN DISPOSABLE) ×3
MANIFOLD NEPTUNE II (INSTRUMENTS) ×2 IMPLANT
NDL SUT 6 .5 CRC .975X.05 MAYO (NEEDLE) IMPLANT
NEEDLE 1/2 CIR CATGUT .05X1.09 (NEEDLE) IMPLANT
NEEDLE MAYO TAPER (NEEDLE)
NEEDLE SCORPION MULTI FIRE (NEEDLE) ×2 IMPLANT
NS IRRIG 1000ML POUR BTL (IV SOLUTION) IMPLANT
PACK ARTHROSCOPY DSU (CUSTOM PROCEDURE TRAY) ×2 IMPLANT
PACK BASIN DAY SURGERY FS (CUSTOM PROCEDURE TRAY) ×2 IMPLANT
PAD ABD 8X10 STRL (GAUZE/BANDAGES/DRESSINGS) ×2 IMPLANT
PORT APPOLLO RF 90DEGREE MULTI (SURGICAL WAND) ×2 IMPLANT
PROBE BIPOLAR ATHRO 135MM 90D (MISCELLANEOUS) ×2 IMPLANT
RESTRAINT HEAD UNIVERSAL NS (MISCELLANEOUS) ×2 IMPLANT
SLEEVE SCD COMPRESS KNEE MED (MISCELLANEOUS) ×2 IMPLANT
SLING ARM FOAM STRAP LRG (SOFTGOODS) IMPLANT
SLING ARM IMMOBILIZER MED (SOFTGOODS) ×2 IMPLANT
SLING ARM MED ADULT FOAM STRAP (SOFTGOODS) IMPLANT
SLING ARM XL FOAM STRAP (SOFTGOODS) IMPLANT
STOCKINETTE IMPERVIOUS LG (DRAPES) ×2 IMPLANT
SUPPORT WRAP ARM LG (MISCELLANEOUS) IMPLANT
SUT ETHILON 3 0 PS 1 (SUTURE) ×2 IMPLANT
SUT PDS AB 0 CT 36 (SUTURE) ×2 IMPLANT
SUT TIGER TAPE 7 IN WHITE (SUTURE) IMPLANT
SUTURE TAPE 1.3 40 TPR END (SUTURE) ×2 IMPLANT
SUTURETAPE 1.3 40 TPR END (SUTURE) ×4
TAPE FIBER 2MM 7IN #2 BLUE (SUTURE) IMPLANT
TAPE HYPAFIX 6X30 (GAUZE/BANDAGES/DRESSINGS) IMPLANT
TOWEL OR 17X26 10 PK STRL BLUE (TOWEL DISPOSABLE) ×2 IMPLANT
TUBING ARTHROSCOPY IRRIG 16FT (MISCELLANEOUS) ×2 IMPLANT
TUBING SUCTION 1/4X6FT (MISCELLANEOUS) ×2 IMPLANT

## 2019-06-17 NOTE — Anesthesia Procedure Notes (Signed)
Procedure Name: Intubation Date/Time: 06/17/2019 2:31 PM Performed by: Albertha Ghee, MD Pre-anesthesia Checklist: Patient identified, Emergency Drugs available, Suction available and Patient being monitored Patient Re-evaluated:Patient Re-evaluated prior to induction Oxygen Delivery Method: Circle system utilized Preoxygenation: Pre-oxygenation with 100% oxygen Induction Type: IV induction Ventilation: Mask ventilation without difficulty Laryngoscope Size: Mac and 3 Grade View: Grade II Tube type: Oral Tube size: 7.0 mm Number of attempts: 1 Airway Equipment and Method: Stylet and Oral airway Placement Confirmation: ETT inserted through vocal cords under direct vision,  positive ETCO2 and breath sounds checked- equal and bilateral Secured at: 21 cm Tube secured with: Tape Dental Injury: Teeth and Oropharynx as per pre-operative assessment

## 2019-06-17 NOTE — H&P (Signed)
Charleston RopesCheryl A Kim is an 63 y.o. female.   Chief Complaint: Left shoulder pain HPI: 63 year old female with left shoulder pain and weakness with high-grade partial rotator cuff tear by MRI, failed conservative management.  Indicated for surgical treatment to decrease pain and restore function.  Past Medical History:  Diagnosis Date  . Allergic sinusitis   . 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 Full dentures   . GERD (gastroesophageal reflux disease)   . Glaucoma, both eyes   . Hiatal hernia   . Hyperlipidemia   . Hyperlipidemia   . Hypertension   . Partial tear of left rotator cuff   . Vitamin D deficiency   . Wears glasses     Past Surgical History:  Procedure Laterality Date  . CESAREAN SECTION  1975  . COLONOSCOPY    . FOOT SURGERY Right 2002  . TRIGGER FINGER RELEASE Right 09/15/2017   Procedure: RELEASE TRIGGER FINGER/A-1 PULLEY RIGHT THUMB;  Surgeon: Cindee SaltKuzma, Gary, MD;  Location: [facility];  Service: Orthopedics;  Laterality: Right;  Bier block  . ULNAR NERVE TRANSPOSITION Right 09/20/2013   Procedure: RIGHT ULNAR NERVE DECOMPRESSION;  Surgeon: Nicki ReaperGary R Kuzma, MD;  Location: [facility];  Service: Orthopedics;  Laterality: Right;  . UPPER GASTROINTESTINAL ENDOSCOPY    . UPPER GI ENDOSCOPY      Family History  Problem Relation Age of Onset  . Heart disease Mother   . Hypertension Mother   . Kidney disease Mother   . Hypertension Father   . Stroke Maternal Grandmother   . Colon cancer Neg Hx    Social History:  reports that she quit smoking about 26 years ago. Her smoking use included cigarettes. She quit after 15.00 years of use. She has never used smokeless tobacco. She reports current alcohol use. She reports that she does not use drugs.  Allergies:  Allergies  Allergen Reactions  . Aspirin Other (See Comments)    dyspepia with higher doses     Medications Prior to Admission  Medication Sig Dispense Refill  . amLODipine (NORVASC) 5  MG tablet Take 5 mg by mouth daily.   3  . Brinzolamide-Brimonidine (SIMBRINZA) 1-0.2 % SUSP Place 1 drop into both eyes 2 (two) times daily.    Marland Kitchen. omeprazole (PRILOSEC OTC) 20 MG tablet Take 20 mg by mouth daily as needed.     . simvastatin (ZOCOR) 40 MG tablet Take 20 mg by mouth at bedtime.     Marland Kitchen. spironolactone (ALDACTONE) 25 MG tablet Take 25 mg by mouth daily.     . travoprost, benzalkonium, (TRAVATAN) 0.004 % ophthalmic solution Place 1 drop into both eyes at bedtime.    . Vitamin D, Ergocalciferol, (DRISDOL) 50000 UNITS CAPS Take 50,000 Units by mouth every 7 (seven) days.       Results for orders placed or performed during the hospital encounter of 06/17/19 (from the past 48 hour(s))  I-STAT, chem 8     Status: Abnormal   Collection Time: 06/17/19  1:08 PM  Result Value Ref Range   Sodium 141 135 - 145 mmol/L   Potassium 4.5 3.5 - 5.1 mmol/L   Chloride 103 98 - 111 mmol/L   BUN 9 8 - 23 mg/dL   Creatinine, Ser 1.610.70 0.44 - 1.00 mg/dL   Glucose, Bld 096124 (H) 70 - 99 mg/dL   Calcium, Ion 0.451.23 4.091.15 - 1.40 mmol/L   TCO2 31 22 - 32 mmol/L   Hemoglobin 16.0 (H) 12.0 - 15.0 g/dL  HCT 47.0 (H) 36.0 - 46.0 %   No results found.  ROS  Blood pressure (!) 173/74, pulse 62, temperature 98 F (36.7 C), temperature source Oral, resp. rate 16, height 5\' 4"  (1.626 m), weight 58.9 kg, SpO2 100 %. Physical Exam   Assessment/Plan Left shoulder high-grade partial rotator cuff tear, failed conservative management Plan left shoulder arthroscopic rotator cuff repair versus debridement with subacromial decompression. Risks / benefits of surgery discussed Consent on chart  NPO for OR Preop antibiotics   Isabella Stalling, MD 06/17/2019, 1:52 PM

## 2019-06-17 NOTE — Anesthesia Procedure Notes (Addendum)
Anesthesia Regional Block: Interscalene brachial plexus block   Pre-Anesthetic Checklist: ,, timeout performed, Correct Patient, Correct Site, Correct Laterality, Correct Procedure, Correct Position, site marked, Risks and benefits discussed,  Surgical consent,  Pre-op evaluation,  At surgeon's request and post-op pain management  Laterality: Left  Prep: chloraprep       Needles:  Injection technique: Single-shot  Needle Type: Echogenic Stimulator Needle     Needle Length: 5cm  Needle Gauge: 22     Additional Needles:   Procedures:, nerve stimulator,,, ultrasound used (permanent image in chart),,,,   Nerve Stimulator or Paresthesia:  Response: hand, 0.45 mA,   Additional Responses:   Narrative:  Start time: 06/17/2019 1:55 PM End time: 06/17/2019 2:01 PM Injection made incrementally with aspirations every 5 mL.  Performed by: Personally  Anesthesiologist: Janeece Riggers, MD  Additional Notes: Functioning IV was confirmed and monitors were applied.  A 6mm 22ga Arrow echogenic stimulator needle was used. Sterile prep and drape,hand hygiene and sterile gloves were used. Ultrasound guidance: relevant anatomy identified, needle position confirmed, local anesthetic spread visualized around nerve(s)., vascular puncture avoided.  Image printed for medical record. Negative aspiration and negative test dose prior to incremental administration of local anesthetic. The patient tolerated the procedure well.

## 2019-06-17 NOTE — Discharge Instructions (Signed)
Discharge Instructions after Arthroscopic Shoulder Repair   A sling has been provided for you. Remain in your sling at all times. This includes sleeping in your sling.  Use ice on the shoulder intermittently over the first 48 hours after surgery.  Pain medicine has been prescribed for you.  Use your medicine liberally over the first 48 hours, and then you can begin to taper your use. You may take Extra Strength Tylenol or Tylenol only in place of the pain pills. DO NOT take ANY nonsteroidal anti-inflammatory pain medications: Advil, Motrin, Ibuprofen, Aleve, Naproxen, or Narprosyn.  You may remove your dressing after two days. If the incision sites are still moist, place a Band-Aid over the moist site(s). Change Band-Aids daily until dry.  You may shower 5 days after surgery. The incisions CANNOT get wet prior to 5 days. Simply allow the water to wash over the site and then pat dry. Do not rub the incisions. Make sure your axilla (armpit) is completely dry after showering.  Take one aspirin a day for 2 weeks after surgery, unless you have an aspirin sensitivity/ allergy or asthma.   Please call [phone] during normal business hours or [phone] after hours for any problems. Including the following:  - excessive redness of the incisions - drainage for more than 4 days - fever of more than 101.5 F  *Please note that pain medications will not be refilled after hours or on weekends.   Regional Anesthesia Blocks  1. Numbness or the inability to move the "blocked" extremity may last from 3-48 hours after placement. The length of time depends on the medication injected and your individual response to the medication. If the numbness is not going away after 48 hours, call your surgeon.  2. The extremity that is blocked will need to be protected until the numbness is gone and the  Strength has returned. Because you cannot feel it, you will need to take extra care to avoid injury. Because it may  be weak, you may have difficulty moving it or using it. You may not know what position it is in without looking at it while the block is in effect.  3. For blocks in the legs and feet, returning to weight bearing and walking needs to be done carefully. You will need to wait until the numbness is entirely gone and the strength has returned. You should be able to move your leg and foot normally before you try and bear weight or walk. You will need someone to be with you when you first try to ensure you do not fall and possibly risk injury.  4. Bruising and tenderness at the needle site are common side effects and will resolve in a few days.  5. Persistent numbness or new problems with movement should be communicated to the surgeon or [facility] [phone]).   Post Anesthesia Home Care Instructions  Activity: Get plenty of rest for the remainder of the day. A responsible individual must stay with you for 24 hours following the procedure.  For the next 24 hours, DO NOT: -Drive a car -Advertising copywriterperate machinery -Drink alcoholic beverages -Take any medication unless instructed by your physician -Make any legal decisions or sign important papers.  Meals: Start with liquid foods such as gelatin or soup. Progress to regular foods as tolerated. Avoid greasy, spicy, heavy foods. If nausea and/or vomiting occur, drink only clear liquids until the nausea and/or vomiting subsides. Call your physician if vomiting continues.  Special Instructions/Symptoms: Your  throat may feel dry or sore from the anesthesia or the breathing tube placed in your throat during surgery. If this causes discomfort, gargle with warm salt water. The discomfort should disappear within 24 hours.  Call for changes in skin color, temperature, or sensation.  Information for Discharge Teaching: EXPAREL (bupivacaine liposome injectable suspension)   Your surgeon or anesthesiologist gave you EXPAREL(bupivacaine) to help  control your pain after surgery.   EXPAREL is a local anesthetic that provides pain relief by numbing the tissue around the surgical site.  EXPAREL is designed to release pain medication over time and can control pain for up to 72 hours.  Depending on how you respond to EXPAREL, you may require less pain medication during your recovery.  Possible side effects:  Temporary loss of sensation or ability to move in the area where bupivacaine was injected.  Nausea, vomiting, constipation  Rarely, numbness and tingling in your mouth or lips, lightheadedness, or anxiety may occur.  Call your doctor right away if you think you may be experiencing any of these sensations, or if you have other questions regarding possible side effects.  Follow all other discharge instructions given to you by your surgeon or nurse. Eat a healthy diet and drink plenty of water or other fluids.  If you return to the hospital for any reason within 96 hours following the administration of EXPAREL, it is important for health care providers to know that you have received this anesthetic. A teal colored band has been placed on your arm with the date, time and amount of EXPAREL you have received in order to alert and inform your health care providers. Please leave this armband in place for the full 96 hours following administration, and then you may remove the band.  May remove green armband Thursday June 20, 2019

## 2019-06-17 NOTE — Op Note (Signed)
Procedure(s):   Julia Kim female 63 y.o. 06/17/2019  Preoperative diagnosis: #1 left shoulder rotator cuff tear #2 left shoulder impingement  Postoperative diagnosis: Same  Procedure(s) and Anesthesia Type: #1 arthroscopic left shoulder rotator cuff repair #2 arthroscopic left shoulder subacromial decompression    Surgeon(s) and Role:    Jones Broom* Zophia Marrone, MD - Primary     Surgeon: Berline LopesJustin W Alden Bensinger   Assistants: Damita Lackanielle Lalibert PA-C [facility](Danielle was present and scrubbed throughout the procedure and was essential in positioning, assisting with the camera and instrumentation,, and closure)  Anesthesia: General endotracheal anesthesia with preoperative interscalene block given by the attending anesthesiologist    Procedure Detail  Estimated Blood Loss: Min         Drains: none  Blood Given: none         Specimens: none        Complications:  * No complications entered in OR log *         Disposition: PACU - hemodynamically stable.         Condition: stable    Procedure:   INDICATIONS FOR SURGERY: The patient is 63 y.o. female who has a long history of left shoulder pain which is been refractory to nonoperative management.  MRI reveals high-grade articular sided rotator cuff tear.  Indicated for surgical treatment to decrease pain and restore function.  OPERATIVE FINDINGS: Examination under anesthesia: No stiffness or instability   DESCRIPTION OF PROCEDURE: The patient was identified in preoperative  holding area where I personally marked the operative site after  verifying site, side, and procedure with the patient. An interscalene block was given by the attending anesthesiologist the holding area.  The patient was taken back to the operating room where general anesthesia was induced without complication and was placed in the beach-chair position with the back  elevated about 60 degrees and all extremities and head and neck carefully padded and   positioned.   The left upper extremity was then prepped and  draped in a standard sterile fashion. The appropriate time-out  procedure was carried out. The patient did receive IV antibiotics  within 30 minutes of incision.   A small posterior portal incision was made and the arthroscope was introduced into the joint. An anterior portal was then established above the subscapularis using needle localization. Small cannula was placed anteriorly. Diagnostic arthroscopy was then carried out.  The subscapularis and biceps tendon were noted to be completely intact.  She had some degenerative tearing of the superior and anterior labrum which was debrided back to healthy non-displaceable labral tissue.  The undersurface of the supraspinatus was examined and noted to have very high-grade partial tearing which centrally involved about 90% of the thickness with significant exposure of the tuberosity.  It did not appear to penetrate full-thickness but I did tag this area with a PDS suture percutaneously with an 18-gauge needle to examine it from the bursal side.  The arthroscope was then introduced into the subacromial space a standard lateral portal was established with needle localization. The shaver was used through the lateral portal to perform extensive bursectomy. Coracoacromial ligament was examined and found to be severely frayed indicating chronic impingement.  The bursal surface was carefully examined in the region of the PDS suture and noted to be severely thinned.  It was nearly full-thickness.  The decision was made to complete the tear in order to achieve repair.  11 blade was used through the lateral portal to complete the tear over about  a 1 cm span anterior to posterior.  The shaver was then used to debride the tendon back to healthy edge and the tuberosity was prepared down to a bleeding surface with the bur to promote healing.  At this point the repair was carried out by placing a 4.75 peek  swivel lock anchor just off the articular margin centered in the tear preloaded with 2 suture tapes.  These 4 suture strands were then passed evenly throughout the tear anterior to posterior and brought over into an additional 4.75 peek swivel lock anchor bringing the tendon down nicely over the prepared tuberosity.  The repair was felt to be complete and watertight.  The coracoacromial ligament was taken down off the anterior acromion with the ArthroCare exposing a moderate hooked anterior acromial spur. A high-speed bur was then used through the lateral portal to take down the anterior acromial spur from lateral to medial in a standard acromioplasty.  The acromioplasty was also viewed from the lateral portal and the bur was used as necessary to ensure that the acromion was completely flat from posterior to anterior.  The arthroscopic equipment was removed from the joint and the portals were closed with 3-0 nylon in an interrupted fashion. Sterile dressings were then applied including Xeroform 4 x 4's ABDs and tape. The patient was then allowed to awaken from general anesthesia, placed in a sling, transferred to the stretcher and taken to the recovery room in stable condition.   POSTOPERATIVE PLAN: The patient will be discharged home today and will followup in one week for suture removal and wound check.  She will follow the standard cuff protocol.

## 2019-06-17 NOTE — Anesthesia Preprocedure Evaluation (Signed)
Anesthesia Evaluation  Patient identified by MRN, date of birth, ID band Patient awake    Reviewed: Allergy & Precautions, NPO status , Patient's Chart, lab work & pertinent test results  Airway Mallampati: II  TM Distance: >3 FB Neck ROM: Full    Dental  (+) Dental Advisory Given, Partial Lower, Partial Upper   Pulmonary former smoker,    Pulmonary exam normal breath sounds clear to auscultation       Cardiovascular hypertension, Pt. on medications (-) angina(-) CAD and (-) Past MI Normal cardiovascular exam Rhythm:Regular Rate:Normal     Neuro/Psych  Neuromuscular disease negative psych ROS   GI/Hepatic Neg liver ROS, GERD  Medicated,  Endo/Other  negative endocrine ROS  Renal/GU negative Renal ROS     Musculoskeletal negative musculoskeletal ROS (+)   Abdominal   Peds  Hematology negative hematology ROS (+)   Anesthesia Other Findings Day of surgery medications reviewed with the patient.  Cutaneous lupus erythematosus  Reproductive/Obstetrics                             Anesthesia Physical  Anesthesia Plan  ASA: III  Anesthesia Plan: General   Post-op Pain Management: GA combined w/ Regional for post-op pain   Induction: Intravenous  PONV Risk Score and Plan: 2 and Ondansetron and Dexamethasone  Airway Management Planned: Oral ETT and LMA  Additional Equipment:   Intra-op Plan:   Post-operative Plan: Extubation in OR  Informed Consent: I have reviewed the patients History and Physical, chart, labs and discussed the procedure including the risks, benefits and alternatives for the proposed anesthesia with the patient or authorized representative who has indicated his/her understanding and acceptance.     Dental advisory given  Plan Discussed with: CRNA, Anesthesiologist and Surgeon  Anesthesia Plan Comments: (Discussed both nerve block for pain relief post-op and GA;  including NV, sore throat, dental injury, and pulmonary complications)        Anesthesia Quick Evaluation

## 2019-06-17 NOTE — Transfer of Care (Signed)
  Last Vitals:  Vitals Value Taken Time  BP 135/72 06/17/19 1600  Temp    Pulse 70 06/17/19 1602  Resp 18 06/17/19 1602  SpO2 98 % 06/17/19 1602  Vitals shown include unvalidated device data.  Last Pain:  Vitals:   06/17/19 1205  TempSrc: Oral  PainSc: 0-No pain      Patients Stated Pain Goal: 8 (06/17/19 1205)  Immediate Anesthesia Transfer of Care Note  Patient: Julia Kim  Procedure(s) Performed: Procedure(s) (LRB): SHOULDER ARTHROSCOPY WITH OPEN ROTATOR CUFF REPAIR, SUBACROMIAL DECOMPRESSION (Left)  Patient Location: PACU  Anesthesia Type: General  Level of Consciousness: drowsy, follows commands.  Airway & Oxygen Therapy: Patient Spontanous Breathing and Patient connected to  Nasal cannula oxygen  Post-op Assessment: Report given to PACU RN and Post -op Vital signs reviewed and stable  Post vital signs: Reviewed and stable  Complications: No apparent anesthesia complications

## 2019-06-17 NOTE — Progress Notes (Signed)
Assisted Dr. Oddono with left, ultrasound guided, interscalene  block. Side rails up, monitors on throughout procedure. See vital signs in flow sheet. Tolerated Procedure well. 

## 2019-06-18 ENCOUNTER — Encounter (HOSPITAL_BASED_OUTPATIENT_CLINIC_OR_DEPARTMENT_OTHER): Payer: Self-pay | Admitting: Orthopedic Surgery

## 2019-06-18 NOTE — Telephone Encounter (Signed)
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  This can cause neck pain or shoulder/arm pain but would not affect her legs.  MRis of the thoracic and lumbar spine are normal.   No explanation for symptoms in her legs  If still having sensory symptopms in her legs, we can add gabapentin 300 mg po tid

## 2019-06-19 NOTE — Anesthesia Postprocedure Evaluation (Signed)
Anesthesia Post Note  Patient: Julia Kim  Procedure(s) Performed: SHOULDER ARTHROSCOPY WITH OPEN ROTATOR CUFF REPAIR, SUBACROMIAL DECOMPRESSION (Left Shoulder)     Patient location during evaluation: PACU Anesthesia Type: General Level of consciousness: awake and alert Pain management: pain level controlled Vital Signs Assessment: post-procedure vital signs reviewed and stable Respiratory status: spontaneous breathing, nonlabored ventilation, respiratory function stable and patient connected to nasal cannula oxygen Cardiovascular status: blood pressure returned to baseline and stable Postop Assessment: no apparent nausea or vomiting Anesthetic complications: no    Last Vitals:  Vitals:   06/17/19 1645 06/17/19 1720  BP: 132/71 136/69  Pulse: 68 61  Resp: (!) 21 18  Temp:    SpO2: 93% 98%    Last Pain:  Vitals:   06/18/19 1426  TempSrc:   PainSc: 2                  Aryahna Spagna S

## 2019-06-19 NOTE — Telephone Encounter (Signed)
Called, LVM for pt advising that Dr. Felecia Shelling reviewed results and recommends trying her on gabapentin 300mg  TID po if she is still having sensory symptoms in her legs.  Asked her to call office back if she would like to try this. Gave [facility] phone number

## 2019-06-20 NOTE — Telephone Encounter (Signed)
Called and spoke with pt. Her surgery went well. She does not feel she needs to be on gabapentin currently. She will call back if sx worsen again or if she has any new sx. Nothing further needed.

## 2019-07-01 ENCOUNTER — Encounter: Payer: Self-pay | Admitting: Physical Therapy

## 2019-07-01 ENCOUNTER — Other Ambulatory Visit: Payer: Self-pay

## 2019-07-01 ENCOUNTER — Ambulatory Visit: Payer: 59 | Attending: Orthopedic Surgery | Admitting: Physical Therapy

## 2019-07-01 DIAGNOSIS — M25612 Stiffness of left shoulder, not elsewhere classified: Secondary | ICD-10-CM | POA: Insufficient documentation

## 2019-07-01 DIAGNOSIS — M6281 Muscle weakness (generalized): Secondary | ICD-10-CM | POA: Insufficient documentation

## 2019-07-01 DIAGNOSIS — M542 Cervicalgia: Secondary | ICD-10-CM | POA: Diagnosis present

## 2019-07-01 DIAGNOSIS — M25512 Pain in left shoulder: Secondary | ICD-10-CM | POA: Insufficient documentation

## 2019-07-01 NOTE — Therapy (Signed)
[place], [place], [address] Phone: [phone]   Fax:  [phone]  Physical Therapy Evaluation  Patient Details  Name: DERONDA CHRISTIAN MRN: [id number] Date of Birth: 05/28/1956 Referring Provider (PT): Dr Tania Ade    Encounter Date: 07/01/2019  PT End of Session - 07/01/19 1329    Visit Number  1    Number of Visits  16    Date for PT Re-Evaluation  08/26/19    Authorization Type  [facility] [employer]    PT Start Time  1130    PT Stop Time  1215    PT Time Calculation (min)  45 min    Activity Tolerance  Patient tolerated treatment well    Behavior During Therapy  [facility] for tasks assessed/performed       Past Medical History:  Diagnosis Date  . Allergic sinusitis   . 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 Full dentures   . GERD (gastroesophageal reflux disease)   . Glaucoma, both eyes   . Hiatal hernia   . Hyperlipidemia   . Hyperlipidemia   . Hypertension   . Partial tear of left rotator cuff   . Vitamin D deficiency   . Wears glasses     Past Surgical History:  Procedure Laterality Date  . CESAREAN SECTION  1975  . COLONOSCOPY    . FOOT SURGERY Right 2002  . SHOULDER ARTHROSCOPY WITH OPEN ROTATOR CUFF REPAIR Left 06/17/2019   Procedure: SHOULDER ARTHROSCOPY WITH OPEN ROTATOR CUFF REPAIR, SUBACROMIAL DECOMPRESSION;  Surgeon: Tania Ade, MD;  Location: [place];  Service: Orthopedics;  Laterality: Left;  . TRIGGER FINGER RELEASE Right 09/15/2017   Procedure: RELEASE TRIGGER FINGER/A-1 PULLEY RIGHT THUMB;  Surgeon: Daryll Brod, MD;  Location: [place];  Service: Orthopedics;  Laterality: Right;  Bier block  . ULNAR NERVE TRANSPOSITION Right 09/20/2013   Procedure: RIGHT ULNAR NERVE DECOMPRESSION;  Surgeon: Wynonia Sours, MD;  Location: [place];  Service: Orthopedics;  Laterality: Right;  . UPPER GASTROINTESTINAL ENDOSCOPY    . UPPER GI  ENDOSCOPY      There were no vitals filed for this visit.   Subjective Assessment - 07/01/19 1136    Subjective  Patient fell in September. She had left shoulder pain for several months before seeing Ortho doc. She had a left RTC repair on 06/17/2019. She is currently in a sling    Patient Stated Goals  Nothing post-op    Currently in Pain?  Yes    Pain Score  4     Pain Location  Shoulder    Pain Orientation  Left    Pain Descriptors / Indicators  Aching    Pain Type  Surgical pain    Pain Onset  1 to 4 weeks ago    Pain Frequency  Intermittent    Aggravating Factors   just comes on from time to time    Pain Relieving Factors  rest    Effect of Pain on Daily Activities  unable to use her right shoulder.         [facility] PT Assessment - 07/01/19 0001      Assessment   Medical Diagnosis  Left RTC repair     Referring Provider (PT)  Dr Tania Ade     Onset Date/Surgical Date  06/17/19    Hand Dominance  Left    Next MD Visit  August 5th     Prior Therapy  None       Precautions   Precautions  None      Restrictions   Weight Bearing Restrictions  No      Balance Screen   Has the patient fallen in the past 6 months  No    Has the patient had a decrease in activity level because of a fear of falling?   No    Is the patient reluctant to leave their home because of a fear of falling?   No      Prior Function   Level of Independence  Independent    Vocation  Full time employment      Cognition   Overall Cognitive Status  Within Functional Limits for tasks assessed    Attention  Focused      Observation/Other Assessments   Focus on Therapeutic Outcomes (FOTO)   96% limitation       Sensation   Light Touch  Appears Intact    Additional Comments  denies parathesias       Coordination   Gross Motor Movements are Fluid and Coordinated  Yes    Fine Motor Movements are Fluid and Coordinated  Yes      ROM / Strength   AROM / PROM / Strength  AROM;PROM;Strength       AROM   Overall AROM Comments  not tested       PROM   Overall PROM Comments  gurading and tight end feel     PROM Assessment Site  Shoulder    Right/Left Shoulder  Left    Left Shoulder Flexion  56 Degrees    Left Shoulder ABduction  20 Degrees    Left Shoulder Internal Rotation  50 Degrees    Left Shoulder External Rotation  -21 Degrees      Strength   Overall Strength Comments  not tested       Palpation   Palpation comment  no unexpected tenderness to palpation                 Objective measurements completed on examination: See above findings.      [facility] Adult PT Treatment/Exercise - 07/01/19 0001      Exercises   Exercises  Shoulder      Shoulder Exercises: Seated   Other Seated Exercises  scap retraction x10     Other Seated Exercises  seated supported elbow extension x10       Shoulder Exercises: Standing   Other Standing Exercises  standing pendulum              PT Education - 07/01/19 1326    Education Details  improtance of letting    Person(s) Educated  Patient    Methods  Explanation;Demonstration;Tactile cues;Verbal cues    Comprehension  Verbalized understanding;Returned demonstration;Verbal cues required;Tactile cues required       PT Short Term Goals - 07/01/19 1433      PT SHORT TERM GOAL #1   Title  Patient will increase passive ER to 30 degrees    Time  4    Period  Weeks    Status  New    Target Date  07/29/19      PT SHORT TERM GOAL #2   Title  Patient will increase passive flexion to 100 degrees    Time  4    Period  Weeks    Status  New    Target Date  07/29/19      PT SHORT  TERM GOAL #3   Title  Patiewnt will begin light AAROM per protocol    Time  4    Period  Weeks    Status  New    Target Date  07/29/19        PT Long Term Goals - 07/01/19 1445      PT LONG TERM GOAL #1   Title  Patient will reach behind her head in order to do her hair    Time  8    Period  Weeks    Status  New    Target Date   08/26/19      PT LONG TERM GOAL #2   Title  Patient will reach behind her back to L3 in order to perfrom daily tasks    Time  8    Period  Weeks    Status  New      PT LONG TERM GOAL #3   Title  Patient will reach overhead to shelf to grab object without increased pain in order to perfrom ADL's    Time  8    Period  Weeks    Status  New    Target Date  08/26/19             Plan - 07/01/19 1417    Clinical Impression Statement  Patient is a 63 year old female S/P right RTC and SAD. She presents with expected limiations in all shoulder motion and shoulder strength. She is currently complaint with sling use and her pain is well controlled. She would benefit from skilled therapy to improve functional use of her dominant arm.    Personal Factors and Comorbidities  Comorbidity 1    Comorbidities  lupis    Examination-Activity Limitations  Reach Overhead;Self Feeding;Carry    Examination-Participation Restrictions  Cleaning;Personal Finances    Stability/Clinical Decision Making  Evolving/Moderate complexity    Clinical Decision Making  Moderate    Rehab Potential  Good    PT Frequency  2x / week    PT Duration  8 weeks    PT Treatment/Interventions  ADLs/Self Care Home Management;Electrical Stimulation;Iontophoresis 4mg /ml Dexamethasone;Moist Heat;Traction;Ultrasound;DME Instruction;Functional mobility training;Therapeutic activities;Therapeutic exercise;Neuromuscular re-education;Patient/family education;Manual techniques;Passive range of motion;Taping;Scar mobilization    PT Next Visit Plan  continue with PROM until 5 weeks . [name] has Dr Selina Cooleyhandlers protocol    PT Home Exercise Plan  pendulum, scap retraction to neutral,    Consulted and Agree with Plan of Care  Patient       Patient will benefit from skilled therapeutic intervention in order to improve the following deficits and impairments:  Decreased range of motion, Decreased activity tolerance, Decreased strength, Pain,  Postural dysfunction, Impaired flexibility, Increased muscle spasms, Decreased mobility, Improper body mechanics, Impaired perceived functional ability  Visit Diagnosis: 1. Stiffness of left shoulder, not elsewhere classified   2. Acute pain of left shoulder   3. Muscle weakness (generalized)        Problem List Patient Active Problem List   Diagnosis Date Noted  . Polyneuropathy 03/20/2019  . Numbness 03/20/2019  . Gait disturbance 03/20/2019  . Impingement syndrome of right shoulder 05/16/2017  . Special screening for malignant neoplasms, colon 11/28/2011  . Diverticulosis of colon (without mention of hemorrhage) 11/28/2011  . Abdominal pain 11/15/2011  . Polyp of gallbladder 11/15/2011    Dessie Comaavid J Kamoni Gentles PT DPT  07/01/2019, 4:25 PM  [facility] [address] [place], [place], [address] Phone: [phone]  Fax:  [phone]  Name: Charleston RopesCheryl A Lipkin MRN: [id number] Date of Birth: 08/08/1956

## 2019-07-03 ENCOUNTER — Ambulatory Visit: Payer: 59 | Admitting: Physical Therapy

## 2019-07-03 ENCOUNTER — Encounter: Payer: Self-pay | Admitting: Physical Therapy

## 2019-07-03 ENCOUNTER — Other Ambulatory Visit: Payer: Self-pay

## 2019-07-03 DIAGNOSIS — M542 Cervicalgia: Secondary | ICD-10-CM | POA: Diagnosis not present

## 2019-07-03 DIAGNOSIS — M25612 Stiffness of left shoulder, not elsewhere classified: Secondary | ICD-10-CM

## 2019-07-03 DIAGNOSIS — M25512 Pain in left shoulder: Secondary | ICD-10-CM

## 2019-07-03 DIAGNOSIS — M6281 Muscle weakness (generalized): Secondary | ICD-10-CM | POA: Diagnosis not present

## 2019-07-03 NOTE — Therapy (Signed)
[facility] [address] [place], [place], [address] Phone: [phone]   Fax:  [phone]  Physical Therapy Treatment  Patient Details  Name: Charleston RopesCheryl A Earll MRN: [id number] Date of Birth: 07/21/1956 Referring Provider (PT): Dr Jones BroomJustin Chandler    Encounter Date: 07/03/2019  PT End of Session - 07/03/19 1700    Visit Number  2    Number of Visits  16    Date for PT Re-Evaluation  08/26/19    Authorization Type  [facility]    PT Start Time  1617    PT Stop Time  1655    PT Time Calculation (min)  38 min    Activity Tolerance  Other (comment)   initial guarding with PROM but otherwise well-tolerated   Behavior During Therapy  [facility] for tasks assessed/performed       Past Medical History:  Diagnosis Date  . Allergic sinusitis   . 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 Full dentures   . GERD (gastroesophageal reflux disease)   . Glaucoma, both eyes   . Hiatal hernia   . Hyperlipidemia   . Hyperlipidemia   . Hypertension   . Partial tear of left rotator cuff   . Vitamin D deficiency   . Wears glasses     Past Surgical History:  Procedure Laterality Date  . CESAREAN SECTION  1975  . COLONOSCOPY    . FOOT SURGERY Right 2002  . SHOULDER ARTHROSCOPY WITH OPEN ROTATOR CUFF REPAIR Left 06/17/2019   Procedure: SHOULDER ARTHROSCOPY WITH OPEN ROTATOR CUFF REPAIR, SUBACROMIAL DECOMPRESSION;  Surgeon: Jones Broomhandler, Justin, MD;  [facility];  Service: Orthopedics;  Laterality: Left;  . TRIGGER FINGER RELEASE Right 09/15/2017   Procedure: RELEASE TRIGGER FINGER/A-1 PULLEY RIGHT THUMB;  Surgeon: Cindee SaltKuzma, Gary, MD;  Location: [facility];  Service: Orthopedics;  Laterality: Right;  Bier block  . ULNAR NERVE TRANSPOSITION Right 09/20/2013   Procedure: RIGHT ULNAR NERVE DECOMPRESSION;  Surgeon: Nicki ReaperGary R Kuzma, MD;  Location: [facility];  Service: Orthopedics;  Laterality: Right;  . UPPER  GASTROINTESTINAL ENDOSCOPY    . UPPER GI ENDOSCOPY      There were no vitals filed for this visit.  Subjective Assessment - 07/03/19 1658    Subjective  Pt. reports has been trying pendulums as home. No pain pre-tx. but still having some sleep disturbance.    Currently in Pain?  No/denies         [facility] PT Assessment - 07/03/19 0001      PROM   Left Shoulder Flexion  80 Degrees   by end of session   Left Shoulder Internal Rotation  50 Degrees    Left Shoulder External Rotation  10 Degrees                   [facility] Adult PT Treatment/Exercise - 07/03/19 0001      Exercises   Exercises  Shoulder;Elbow      Elbow Exercises   Other elbow exercises  elbow flexion AROM (in supine) x 20, wrist flexion and extension x 20 ea., forearm pronation and supination x 20, finger/grip flexion with "digiflex" x 20      Shoulder Exercises: Standing   Other Standing Exercises  pendulums x 2 min mod cues for form, scapular retraction isometric x 20      Modalities   Modalities  Moist Heat      Moist Heat Therapy   Number Minutes Moist Heat  10 Minutes   during first 10 min of PROM, pt. declined cryo post-tx.   Moist Heat Location  Shoulder      Manual Therapy   Manual Therapy  Passive ROM    Passive ROM  Left shoulder PROM within MD protocol parameters             PT Education - 07/03/19 1659    Education Details  exercise form (pendulums), POC, protocol parameters and restrictions    Person(s) Educated  Patient    Methods  Explanation;Demonstration;Verbal cues    Comprehension  Verbalized understanding;Returned demonstration       PT Short Term Goals - 07/01/19 1433      PT SHORT TERM GOAL #1   Title  Patient will increase passive ER to 30 degrees    Time  4    Period  Weeks    Status  New    Target Date  07/29/19      PT SHORT TERM GOAL #2   Title  Patient will increase passive flexion to 100 degrees    Time  4    Period  Weeks    Status  New    Target  Date  07/29/19      PT SHORT TERM GOAL #3   Title  Patiewnt will begin light AAROM per protocol    Time  4    Period  Weeks    Status  New    Target Date  07/29/19        PT Long Term Goals - 07/01/19 1445      PT LONG TERM GOAL #1   Title  Patient will reach behind her head in order to do her hair    Time  8    Period  Weeks    Status  New    Target Date  08/26/19      PT LONG TERM GOAL #2   Title  Patient will reach behind her back to L3 in order to perfrom daily tasks    Time  8    Period  Weeks    Status  New      PT LONG TERM GOAL #3   Title  Patient will reach overhead to shelf to grab object without increased pain in order to perfrom ADL's    Time  8    Period  Weeks    Status  New    Target Date  08/26/19            Plan - 07/03/19 1645    Clinical Impression Statement  Initial guarding with PROM but improved ability to relax with cueing/subsequently able to progress PROM within MD protocol parameters. Need for continued PT will be ongoing s/p surgery.    Stability/Clinical Decision Making  Evolving/Moderate complexity    Clinical Decision Making  Moderate    Rehab Potential  Good    PT Frequency  2x / week    PT Duration  8 weeks    PT Treatment/Interventions  ADLs/Self Care Home Management;Electrical Stimulation;Iontophoresis 4mg /ml Dexamethasone;Moist Heat;Traction;Ultrasound;DME Instruction;Functional mobility training;Therapeutic activities;Therapeutic exercise;Neuromuscular re-education;Patient/family education;Manual techniques;Passive range of motion;Taping;Scar mobilization    PT Next Visit Plan  continue with PROM until 5 weeks . [name] has Dr Selina Cooleyhandlers protocol    PT Home Exercise Plan  pendulum, scap retraction to neutral,    Consulted and Agree with Plan of Care  Patient       Patient will benefit from skilled therapeutic intervention in order to  improve the following deficits and impairments:  Decreased range of motion, Decreased activity  tolerance, Decreased strength, Pain, Postural dysfunction, Impaired flexibility, Increased muscle spasms, Decreased mobility, Improper body mechanics, Impaired perceived functional ability  Visit Diagnosis: 1. Stiffness of left shoulder, not elsewhere classified   2. Acute pain of left shoulder   3. Muscle weakness (generalized)        Problem List Patient Active Problem List   Diagnosis Date Noted  . Polyneuropathy 03/20/2019  . Numbness 03/20/2019  . Gait disturbance 03/20/2019  . Impingement syndrome of right shoulder 05/16/2017  . Special screening for malignant neoplasms, colon 11/28/2011  . Diverticulosis of colon (without mention of hemorrhage) 11/28/2011  . Abdominal pain 11/15/2011  . Polyp of gallbladder 11/15/2011    Beaulah Dinning, PT, DPT 07/03/19 5:02 PM  [place] [facility] [address] [place], [place], [address] Phone: [phone]   Fax:  [phone]  Name: NICO ROGNESS MRN: [id number] Date of Birth: Apr 06, 1956

## 2019-07-10 ENCOUNTER — Ambulatory Visit: Payer: 59 | Admitting: Physical Therapy

## 2019-07-10 ENCOUNTER — Encounter: Payer: Self-pay | Admitting: Physical Therapy

## 2019-07-10 ENCOUNTER — Other Ambulatory Visit: Payer: Self-pay

## 2019-07-10 DIAGNOSIS — M25612 Stiffness of left shoulder, not elsewhere classified: Secondary | ICD-10-CM | POA: Diagnosis not present

## 2019-07-10 DIAGNOSIS — M542 Cervicalgia: Secondary | ICD-10-CM | POA: Diagnosis not present

## 2019-07-10 DIAGNOSIS — M6281 Muscle weakness (generalized): Secondary | ICD-10-CM

## 2019-07-10 DIAGNOSIS — M25512 Pain in left shoulder: Secondary | ICD-10-CM

## 2019-07-10 NOTE — Therapy (Signed)
[facility] [address] [place], [place], [address] Phone: [phone]   Fax:  [phone]  Physical Therapy Treatment  Patient Details  Name: Charleston RopesCheryl A Boesel MRN: [id number] Date of Birth: 05/07/1956 Referring Provider (PT): Dr Jones BroomJustin Chandler    Encounter Date: 07/10/2019  PT End of Session - 07/10/19 1347    Visit Number  3    Number of Visits  16    Date for PT Re-Evaluation  08/26/19    Authorization Type  [facility]    PT Start Time  1000    PT Stop Time  1039    PT Time Calculation (min)  39 min    Activity Tolerance  Patient tolerated treatment well    Behavior During Therapy  [facility] for tasks assessed/performed       Past Medical History:  Diagnosis Date  . Allergic sinusitis   . 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 Full dentures   . GERD (gastroesophageal reflux disease)   . Glaucoma, both eyes   . Hiatal hernia   . Hyperlipidemia   . Hyperlipidemia   . Hypertension   . Partial tear of left rotator cuff   . Vitamin D deficiency   . Wears glasses     Past Surgical History:  Procedure Laterality Date  . CESAREAN SECTION  1975  . COLONOSCOPY    . FOOT SURGERY Right 2002  . SHOULDER ARTHROSCOPY WITH OPEN ROTATOR CUFF REPAIR Left 06/17/2019   Procedure: SHOULDER ARTHROSCOPY WITH OPEN ROTATOR CUFF REPAIR, SUBACROMIAL DECOMPRESSION;  Surgeon: Jones Broomhandler, Justin, MD;  [facility] ;  Service: Orthopedics;  Laterality: Left;  . TRIGGER FINGER RELEASE Right 09/15/2017   Procedure: RELEASE TRIGGER FINGER/A-1 PULLEY RIGHT THUMB;  Surgeon: Cindee SaltKuzma, Gary, MD;  Location: [facility];  Service: Orthopedics;  Laterality: Right;  Bier block  . ULNAR NERVE TRANSPOSITION Right 09/20/2013   Procedure: RIGHT ULNAR NERVE DECOMPRESSION;  Surgeon: Nicki ReaperGary R Kuzma, MD;  Location: [facility];  Service: Orthopedics;  Laterality: Right;  . UPPER GASTROINTESTINAL ENDOSCOPY    . UPPER GI  ENDOSCOPY      There were no vitals filed for this visit.  Subjective Assessment - 07/10/19 1345    Subjective  Patient continues to report no pain. The numbness has subsided. Shehas full feeling in her hand.    Patient Stated Goals  Nothing post-op    Currently in Pain?  No/denies                       [facility] Treatment/Exercise - 07/10/19 0001      Elbow Exercises   Other elbow exercises  wrsit flexion and extension seated 1lb; Forearm pronation/supination x20 1lb       Shoulder Exercises: Standing   Other Standing Exercises  pendulums x 2 min less cueing for form, scapular retraction isometric x 20      Manual Therapy   Manual Therapy  Passive ROM;Joint mobilization    Joint Mobilization  grade I and II PA and AP mobilizations to the right shoulder.     Passive ROM  Left shoulder PROM within MD protocol parameters             PT Education - 07/10/19 1346    Education Details  reviewed HEP, symptom mangement    Person(s) Educated  Patient    Methods  Explanation;Tactile cues;Demonstration;Verbal cues    Comprehension  Verbalized understanding;Returned demonstration;Verbal cues required;Tactile cues  required       PT Short Term Goals - 07/10/19 1409      PT SHORT TERM GOAL #1   Title  Patient will increase passive ER to 30 degrees    Baseline  improving    Time  4    Period  Weeks    Status  On-going      PT SHORT TERM GOAL #2   Title  Patient will increase passive flexion to 100 degrees    Time  4    Period  Weeks    Status  On-going      PT SHORT TERM GOAL #3   Title  Patiewnt will begin light AAROM per protocol    Time  4    Period  Weeks    Status  On-going        PT Long Term Goals - 07/01/19 1445      PT LONG TERM GOAL #1   Title  Patient will reach behind her head in order to do her hair    Time  8    Period  Weeks    Status  New    Target Date  08/26/19      PT LONG TERM GOAL #2   Title  Patient will reach  behind her back to L3 in order to perfrom daily tasks    Time  8    Period  Weeks    Status  New      PT LONG TERM GOAL #3   Title  Patient will reach overhead to shelf to grab object without increased pain in order to perfrom ADL's    Time  8    Period  Weeks    Status  New    Target Date  08/26/19            Plan - 07/10/19 1348    Clinical Impression Statement  Improved gaurding today. Patient continues to fall within protocol parameters. She has no significant pain with PROM and had no pain after treatment. Patient continues to work on wrist and elbow strengthening.    Personal Factors and Comorbidities  Comorbidity 1    Comorbidities  lupis    Examination-Activity Limitations  Reach Overhead;Self Feeding;Carry    Examination-Participation Restrictions  Cleaning;Personal Finances    Stability/Clinical Decision Making  Evolving/Moderate complexity    Clinical Decision Making  Moderate    Rehab Potential  Good    PT Frequency  2x / week    PT Duration  8 weeks    PT Treatment/Interventions  ADLs/Self Care Home Management;Electrical Stimulation;Iontophoresis 4mg /ml Dexamethasone;Moist Heat;Traction;Ultrasound;DME Instruction;Functional mobility training;Therapeutic activities;Therapeutic exercise;Neuromuscular re-education;Patient/family education;Manual techniques;Passive range of motion;Taping;Scar mobilization    PT Next Visit Plan  continue with PROM until 5 weeks . [name] has Dr Selina Cooleyhandlers protocol    PT Home Exercise Plan  pendulum, scap retraction to neutral,    Consulted and Agree with Plan of Care  Patient       Patient will benefit from skilled therapeutic intervention in order to improve the following deficits and impairments:  Decreased range of motion, Decreased activity tolerance, Decreased strength, Pain, Postural dysfunction, Impaired flexibility, Increased muscle spasms, Decreased mobility, Improper body mechanics, Impaired perceived functional ability  Visit  Diagnosis: 1. Stiffness of left shoulder, not elsewhere classified   2. Acute pain of left shoulder   3. Muscle weakness (generalized)        Problem List Patient Active Problem List   Diagnosis Date Noted  .  Polyneuropathy 03/20/2019  . Numbness 03/20/2019  . Gait disturbance 03/20/2019  . Impingement syndrome of right shoulder 05/16/2017  . Special screening for malignant neoplasms, colon 11/28/2011  . Diverticulosis of colon (without mention of hemorrhage) 11/28/2011  . Abdominal pain 11/15/2011  . Polyp of gallbladder 11/15/2011    Carney Living PT DPT  07/10/2019, 2:11 PM  [facility] [address] [place], [place], [address] Phone: [phone]   Fax:  [phone]  Name: JALASIA ESKRIDGE MRN: [id number] Date of Birth: [birth year]

## 2019-07-12 ENCOUNTER — Other Ambulatory Visit: Payer: Self-pay

## 2019-07-12 ENCOUNTER — Ambulatory Visit: Payer: 59 | Admitting: Physical Therapy

## 2019-07-12 ENCOUNTER — Encounter: Payer: Self-pay | Admitting: Physical Therapy

## 2019-07-12 DIAGNOSIS — M25612 Stiffness of left shoulder, not elsewhere classified: Secondary | ICD-10-CM

## 2019-07-12 DIAGNOSIS — M6281 Muscle weakness (generalized): Secondary | ICD-10-CM | POA: Diagnosis not present

## 2019-07-12 DIAGNOSIS — M542 Cervicalgia: Secondary | ICD-10-CM | POA: Diagnosis not present

## 2019-07-12 DIAGNOSIS — M25512 Pain in left shoulder: Secondary | ICD-10-CM

## 2019-07-12 NOTE — Therapy (Signed)
[facility] [address] [place], [place], [address] Phone: [phone]   Fax:  [phone]  Physical Therapy Treatment  Patient Details  Name: Julia RopesCheryl A Kim MRN: [id number] Date of Birth: 04/11/1956 Referring Provider (PT): Julia Kim    Encounter Date: 07/12/2019  PT End of Session - 07/12/19 0942    Visit Number  4    Number of Visits  16    Date for PT Re-Evaluation  08/26/19    Authorization Type  [facility]    PT Start Time  0915    PT Stop Time  0955    PT Time Calculation (min)  40 min    Activity Tolerance  Patient tolerated treatment well    Behavior During Therapy  [facility] for tasks assessed/performed       Past Medical History:  Diagnosis Date  . Allergic sinusitis   . 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 Full dentures   . GERD (gastroesophageal reflux disease)   . Glaucoma, both eyes   . Hiatal hernia   . Hyperlipidemia   . Hyperlipidemia   . Hypertension   . Partial tear of left rotator cuff   . Vitamin D deficiency   . Wears glasses     Past Surgical History:  Procedure Laterality Date  . CESAREAN SECTION  1975  . COLONOSCOPY    . FOOT SURGERY Right 2002  . SHOULDER ARTHROSCOPY WITH OPEN ROTATOR CUFF REPAIR Left 06/17/2019   Procedure: SHOULDER ARTHROSCOPY WITH OPEN ROTATOR CUFF REPAIR, SUBACROMIAL DECOMPRESSION;  Surgeon: Julia Kim, Justin, MD;  [facility];  Service: Orthopedics;  Laterality: Left;  . TRIGGER FINGER RELEASE Right 09/15/2017   Procedure: RELEASE TRIGGER FINGER/A-1 PULLEY RIGHT THUMB;  Surgeon: Cindee SaltKuzma, Gary, MD;  Location: [facility];  Service: Orthopedics;  Laterality: Right;  Bier block  . ULNAR NERVE TRANSPOSITION Right 09/20/2013   Procedure: RIGHT ULNAR NERVE DECOMPRESSION;  Surgeon: Nicki ReaperGary R Kuzma, MD;  Location: [facility];  Service: Orthopedics;  Laterality: Right;  . UPPER GASTROINTESTINAL ENDOSCOPY    . UPPER GI  ENDOSCOPY      There were no vitals filed for this visit.  Subjective Assessment - 07/12/19 0922    Subjective  Patient reported minor pain at night after the last session but no pain the next day.    Patient Stated Goals  Nothing post-op    Currently in Pain?  No/denies                       [facility] - 07/12/19 0001      Elbow Exercises   Other elbow exercises  wrist flexion and extension seated 1lb; Forearm pronation/supination x20 1lb       Shoulder Exercises: Supine   Other Supine Exercises  elbow flexion       Shoulder Exercises: Standing   Other Standing Exercises  pendulums x 2 min less cueing for form, scapular retraction isometric x 20      Manual Therapy   Manual Therapy  Passive ROM;Joint mobilization    Joint Mobilization  grade I and II PA and AP mobilizations to the right shoulder.     Passive ROM  Left shoulder PROM within MD protocol parameters             PT Education - 07/12/19 0942    Education Details  reviewed protocol    Person(s) Educated  Patient    Methods  Explanation;Demonstration;Tactile cues;Verbal cues    Comprehension  Verbalized understanding;Returned demonstration;Verbal cues required;Tactile cues required       PT Short Term Goals - 07/12/19 0947      PT SHORT TERM GOAL #1   Title  Patient will increase passive ER to 30 degrees    Baseline  improving    Time  4    Period  Weeks    Status  On-going      PT SHORT TERM GOAL #2   Title  Patient will increase passive flexion to 100 degrees    Time  4    Period  Weeks    Target Date  07/29/19      PT SHORT TERM GOAL #3   Title  Patiewnt will begin light AAROM per protocol    Time  4    Period  Weeks    Status  On-going    Target Date  07/29/19        PT Long Term Goals - 07/01/19 1445      PT LONG TERM GOAL #1   Title  Patient will reach behind her head in order to do her hair    Time  8    Period  Weeks    Status  New     Target Date  08/26/19      PT LONG TERM GOAL #2   Title  Patient will reach behind her back to L3 in order to perfrom daily tasks    Time  8    Period  Weeks    Status  New      PT LONG TERM GOAL #3   Title  Patient will reach overhead to shelf to grab object without increased pain in order to perfrom ADL's    Time  8    Period  Weeks    Status  New    Target Date  08/26/19            Plan - 07/12/19 0943    Clinical Impression Statement  Patient is approaching protocol range limitations with no resistance or pain at end ranges. She is making great progress. No pain with treatment. She is tolerating light forearm ther-ex well.Patient will be at 4 weeks post op Next Monday. Follwo protocol.    Personal Factors and Comorbidities  Comorbidity 1    Comorbidities  lupis    Examination-Activity Limitations  Reach Overhead;Self Feeding;Carry    Stability/Clinical Decision Making  Evolving/Moderate complexity    Clinical Decision Making  Moderate    Rehab Potential  Good    PT Frequency  2x / week    PT Duration  8 weeks    PT Treatment/Interventions  ADLs/Self Care Home Management;Electrical Stimulation;Iontophoresis 4mg /ml Dexamethasone;Moist Heat;Traction;Ultrasound;DME Instruction;Functional mobility training;Therapeutic activities;Therapeutic exercise;Neuromuscular re-education;Patient/family education;Manual techniques;Passive range of motion;Taping;Scar mobilization    PT Next Visit Plan  continue with PROM until 5 weeks . [name] has Julia Selina Cooleyhandlers protocol    PT Home Exercise Plan  pendulum, scap retraction to neutral,    Consulted and Agree with Plan of Care  Patient       Patient will benefit from skilled therapeutic intervention in order to improve the following deficits and impairments:  Decreased range of motion, Decreased activity tolerance, Decreased strength, Pain, Postural dysfunction, Impaired flexibility, Increased muscle spasms, Decreased mobility, Improper body  mechanics, Impaired perceived functional ability  Visit Diagnosis: 1. Stiffness of left shoulder, not elsewhere classified   2. Acute pain of left shoulder  3. Muscle weakness (generalized)        Problem List Patient Active Problem List   Diagnosis Date Noted  . Polyneuropathy 03/20/2019  . Numbness 03/20/2019  . Gait disturbance 03/20/2019  . Impingement syndrome of right shoulder 05/16/2017  . Special screening for malignant neoplasms, colon 11/28/2011  . Diverticulosis of colon (without mention of hemorrhage) 11/28/2011  . Abdominal pain 11/15/2011  . Polyp of gallbladder 11/15/2011    Carney Living PT DPT  07/12/2019, 11:27 AM  [facility] [address] [place], [place], [address] Phone: [phone]   Fax:  [phone]  Name: Julia Kim MRN: [id number] Date of Birth: 06-May-1956

## 2019-07-16 DIAGNOSIS — Z1211 Encounter for screening for malignant neoplasm of colon: Secondary | ICD-10-CM | POA: Diagnosis not present

## 2019-07-16 DIAGNOSIS — E782 Mixed hyperlipidemia: Secondary | ICD-10-CM | POA: Diagnosis not present

## 2019-07-16 DIAGNOSIS — R232 Flushing: Secondary | ICD-10-CM | POA: Diagnosis not present

## 2019-07-16 DIAGNOSIS — L932 Other local lupus erythematosus: Secondary | ICD-10-CM | POA: Diagnosis not present

## 2019-07-16 DIAGNOSIS — I10 Essential (primary) hypertension: Secondary | ICD-10-CM | POA: Diagnosis not present

## 2019-07-16 DIAGNOSIS — Z0000 Encounter for general adult medical examination without abnormal findings: Secondary | ICD-10-CM | POA: Diagnosis not present

## 2019-07-16 DIAGNOSIS — M25512 Pain in left shoulder: Secondary | ICD-10-CM | POA: Diagnosis not present

## 2019-07-16 DIAGNOSIS — E559 Vitamin D deficiency, unspecified: Secondary | ICD-10-CM | POA: Diagnosis not present

## 2019-07-16 DIAGNOSIS — R202 Paresthesia of skin: Secondary | ICD-10-CM | POA: Diagnosis not present

## 2019-07-16 MED FILL — AMLODIPINE 2.5 MG TABLET: 2.5 | 90 days supply | Qty: 90 | Fill #0

## 2019-07-16 MED FILL — VIT D2 1.25 MG (50,000 UNIT: 1.25 MG | 90 days supply | Qty: 13 | Fill #0

## 2019-07-16 MED FILL — SPIRONOLACTONE 25 MG TABS: 25 | 90 days supply | Qty: 90 | Fill #0

## 2019-07-16 MED FILL — SIMVASTATIN 20 MG TABLET: 20 | 90 days supply | Qty: 90 | Fill #0

## 2019-07-17 ENCOUNTER — Encounter: Payer: Self-pay | Admitting: Physical Therapy

## 2019-07-17 ENCOUNTER — Other Ambulatory Visit: Payer: Self-pay

## 2019-07-17 ENCOUNTER — Ambulatory Visit: Payer: 59 | Admitting: Physical Therapy

## 2019-07-17 DIAGNOSIS — M542 Cervicalgia: Secondary | ICD-10-CM | POA: Diagnosis not present

## 2019-07-17 DIAGNOSIS — M6281 Muscle weakness (generalized): Secondary | ICD-10-CM | POA: Diagnosis not present

## 2019-07-17 DIAGNOSIS — M25612 Stiffness of left shoulder, not elsewhere classified: Secondary | ICD-10-CM

## 2019-07-17 DIAGNOSIS — M25512 Pain in left shoulder: Secondary | ICD-10-CM | POA: Diagnosis not present

## 2019-07-17 NOTE — Therapy (Signed)
[facility] [address] [place], [place], [address] Phone: [phone]   Fax:  [phone]  Physical Therapy Treatment  Patient Details  Name: Julia Kim MRN: [id number] Date of Birth: 05/01/1956 Referring Provider (PT): Dr Jones BroomJustin Chandler    Encounter Date: 07/17/2019  PT End of Session - 07/17/19 0923    Visit Number  5    Number of Visits  16    Date for PT Re-Evaluation  08/26/19    Authorization Type  [facility]    PT Start Time  0917    PT Stop Time  0955    PT Time Calculation (min)  38 min    Activity Tolerance  Patient tolerated treatment well    Behavior During Therapy  [facility] for tasks assessed/performed       Past Medical History:  Diagnosis Date  . Allergic sinusitis   . 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 Full dentures   . GERD (gastroesophageal reflux disease)   . Glaucoma, both eyes   . Hiatal hernia   . Hyperlipidemia   . Hyperlipidemia   . Hypertension   . Partial tear of left rotator cuff   . Vitamin D deficiency   . Wears glasses     Past Surgical History:  Procedure Laterality Date  . CESAREAN SECTION  1975  . COLONOSCOPY    . FOOT SURGERY Right 2002  . SHOULDER ARTHROSCOPY WITH OPEN ROTATOR CUFF REPAIR Left 06/17/2019   Procedure: SHOULDER ARTHROSCOPY WITH OPEN ROTATOR CUFF REPAIR, SUBACROMIAL DECOMPRESSION;  Surgeon: Jones Broomhandler, Justin, MD;  [facility];  Service: Orthopedics;  Laterality: Left;  . TRIGGER FINGER RELEASE Right 09/15/2017   Procedure: RELEASE TRIGGER FINGER/A-1 PULLEY RIGHT THUMB;  Surgeon: Cindee SaltKuzma, Gary, MD;  Location: [facility];  Service: Orthopedics;  Laterality: Right;  Bier block  . ULNAR NERVE TRANSPOSITION Right 09/20/2013   Procedure: RIGHT ULNAR NERVE DECOMPRESSION;  Surgeon: Nicki ReaperGary R Kuzma, MD;  Location: [facility];  Service: Orthopedics;  Laterality: Right;  . UPPER GASTROINTESTINAL ENDOSCOPY    . UPPER GI  ENDOSCOPY      There were no vitals filed for this visit.  Subjective Assessment - 07/17/19 0921    Subjective  Patient has no complaints today. She was a little sore after the last visit but not bad. She is having no pain today.    Currently in Pain?  No/denies         [facility] PT Assessment - 07/17/19 0001      PROM   Left Shoulder Flexion  120 Degrees    Left Shoulder Internal Rotation  65 Degrees    Left Shoulder External Rotation  30 Degrees                   [facility] Adult PT Treatment/Exercise - 07/17/19 0001      Elbow Exercises   Other elbow exercises  wrist flexion and extension seated 1lb; Forearm pronation/supination x20 1lb       Shoulder Exercises: Supine   Other Supine Exercises  elbow flexion x20       Shoulder Exercises: Seated   Other Seated Exercises  wrist supination/pro 20x 2lb; flexion / extension 2lb x20 each       Shoulder Exercises: Standing   Other Standing Exercises  pendulums x 2 min less cueing for form, scapular retraction isometric x 20      Manual Therapy   Manual Therapy  Passive ROM;Joint mobilization  Joint Mobilization  grade I and II PA and AP mobilizations to the right shoulder.     Passive ROM  Left shoulder PROM within MD protocol parameters             PT Education - 07/17/19 0922    Education Details  updated on protocol    Person(s) Educated  Patient    Methods  Explanation;Demonstration;Tactile cues;Verbal cues    Comprehension  Verbalized understanding;Returned demonstration;Verbal cues required;Tactile cues required       PT Short Term Goals - 07/17/19 0954      PT SHORT TERM GOAL #1   Title  Patient will increase passive ER to 30 degrees    Baseline  improving    Time  4    Period  Weeks    Status  On-going      PT SHORT TERM GOAL #2   Title  Patient will increase passive flexion to 100 degrees    Time  4    Period  Weeks    Status  On-going    Target Date  07/29/19      PT SHORT TERM GOAL #3    Title  Patiewnt will begin light AAROM per protocol    Time  4    Period  Weeks    Status  On-going    Target Date  07/29/19        PT Long Term Goals - 07/01/19 1445      PT LONG TERM GOAL #1   Title  Patient will reach behind her head in order to do her hair    Time  8    Period  Weeks    Status  New    Target Date  08/26/19      PT LONG TERM GOAL #2   Title  Patient will reach behind her back to L3 in order to perfrom daily tasks    Time  8    Period  Weeks    Status  New      PT LONG TERM GOAL #3   Title  Patient will reach overhead to shelf to grab object without increased pain in order to perfrom ADL's    Time  8    Period  Weeks    Status  New    Target Date  08/26/19            Plan - 07/17/19 0942    Clinical Impression Statement  Patient continues to make good progress. She was a little stiff when we started but her shoulder opened up well as we continued to stretch. Her numbers continue to progress very well. She is not being pushed into end ranges per protocol limits.    Comorbidities  lupis    Examination-Activity Limitations  Reach Overhead;Self Feeding;Carry    Examination-Participation Restrictions  Cleaning;Personal Finances    Stability/Clinical Decision Making  Evolving/Moderate complexity    Rehab Potential  Good    PT Frequency  2x / week    PT Duration  8 weeks    PT Treatment/Interventions  ADLs/Self Care Home Management;Electrical Stimulation;Iontophoresis 4mg /ml Dexamethasone;Moist Heat;Traction;Ultrasound;DME Instruction;Functional mobility training;Therapeutic activities;Therapeutic exercise;Neuromuscular re-education;Patient/family education;Manual techniques;Passive range of motion;Taping;Scar mobilization    PT Next Visit Plan  continue with PROM until 5 weeks . [name] has Dr Selina Cooleyhandlers protocol    PT Home Exercise Plan  pendulum, scap retraction to neutral,    Consulted and Agree with Plan of Care  Patient  Patient will benefit  from skilled therapeutic intervention in order to improve the following deficits and impairments:     Visit Diagnosis: 1. Stiffness of left shoulder, not elsewhere classified   2. Acute pain of left shoulder   3. Muscle weakness (generalized)   4. Cervicalgia        Problem List Patient Active Problem List   Diagnosis Date Noted  . Polyneuropathy 03/20/2019  . Numbness 03/20/2019  . Gait disturbance 03/20/2019  . Impingement syndrome of right shoulder 05/16/2017  . Special screening for malignant neoplasms, colon 11/28/2011  . Diverticulosis of colon (without mention of hemorrhage) 11/28/2011  . Abdominal pain 11/15/2011  . Polyp of gallbladder 11/15/2011    Carney Living PT DPT  07/17/2019, 10:54 AM  [facility] [address] [place], [place], [address] Phone: [phone]   Fax:  [phone]  Name: Julia Kim MRN: [id number] Date of Birth: [birth year]

## 2019-07-19 ENCOUNTER — Encounter: Payer: Self-pay | Admitting: Physical Therapy

## 2019-07-19 ENCOUNTER — Ambulatory Visit: Payer: 59 | Admitting: Physical Therapy

## 2019-07-19 ENCOUNTER — Other Ambulatory Visit: Payer: Self-pay

## 2019-07-19 DIAGNOSIS — M6281 Muscle weakness (generalized): Secondary | ICD-10-CM

## 2019-07-19 DIAGNOSIS — M542 Cervicalgia: Secondary | ICD-10-CM

## 2019-07-19 DIAGNOSIS — M25512 Pain in left shoulder: Secondary | ICD-10-CM

## 2019-07-19 DIAGNOSIS — M25612 Stiffness of left shoulder, not elsewhere classified: Secondary | ICD-10-CM | POA: Diagnosis not present

## 2019-07-19 NOTE — Therapy (Signed)
[facility] [address] [place], [place], [address] Phone: [phone]   Fax:  [phone]  Physical Therapy Treatment  Patient Details  Name: Charleston RopesCheryl A Kraemer MRN: [id number] Date of Birth: 11/04/1956 Referring Provider (PT): Dr Jones BroomJustin Chandler    Encounter Date: 07/19/2019  PT End of Session - 07/19/19 1015    Visit Number  6    Number of Visits  16    Date for PT Re-Evaluation  08/26/19    Authorization Type  [facility]    PT Start Time  1000    PT Stop Time  1042    PT Time Calculation (min)  42 min    Activity Tolerance  Patient tolerated treatment well    Behavior During Therapy  [facility] for tasks assessed/performed       Past Medical History:  Diagnosis Date  . Allergic sinusitis   . 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 Full dentures   . GERD (gastroesophageal reflux disease)   . Glaucoma, both eyes   . Hiatal hernia   . Hyperlipidemia   . Hyperlipidemia   . Hypertension   . Partial tear of left rotator cuff   . Vitamin D deficiency   . Wears glasses     Past Surgical History:  Procedure Laterality Date  . CESAREAN SECTION  1975  . COLONOSCOPY    . FOOT SURGERY Right 2002  . SHOULDER ARTHROSCOPY WITH OPEN ROTATOR CUFF REPAIR Left 06/17/2019   Procedure: SHOULDER ARTHROSCOPY WITH OPEN ROTATOR CUFF REPAIR, SUBACROMIAL DECOMPRESSION;  Surgeon: Jones Broomhandler, Justin, MD;  [facility];  Service: Orthopedics;  Laterality: Left;  . TRIGGER FINGER RELEASE Right 09/15/2017   Procedure: RELEASE TRIGGER FINGER/A-1 PULLEY RIGHT THUMB;  Surgeon: Cindee SaltKuzma, Gary, MD;  Location: [facility];  Service: Orthopedics;  Laterality: Right;  Bier block  . ULNAR NERVE TRANSPOSITION Right 09/20/2013   Procedure: RIGHT ULNAR NERVE DECOMPRESSION;  Surgeon: Nicki ReaperGary R Kuzma, MD;  Location: [facility];  Service: Orthopedics;  Laterality: Right;  . UPPER GASTROINTESTINAL ENDOSCOPY    . UPPER GI  ENDOSCOPY      There were no vitals filed for this visit.  Subjective Assessment - 07/19/19 1004    Subjective  Patient has no complain today. She was a little sore after the last visit. She is at the end of her fourth week.    Currently in Pain?  No/denies                       [facility] Treatment/Exercise - 07/19/19 0001      Elbow Exercises   Other elbow exercises  wrist flexion and extension seated 1lb; Forearm pronation/supination x20 1lb       Shoulder Exercises: Supine   Other Supine Exercises  elbow flexion x20       Shoulder Exercises: Seated   Other Seated Exercises  wrist supination/pro 20x 2lb; flexion / extension 2lb x20 each       Shoulder Exercises: Standing   Other Standing Exercises  pendulums x 2 min less cueing for form, scapular retraction isometric x 20      Manual Therapy   Manual Therapy  Passive ROM;Joint mobilization    Joint Mobilization  grade I and II PA and AP mobilizations to the right shoulder.     Passive ROM  Left shoulder PROM within MD protocol parameters  PT Education - 07/19/19 1015    Education Details  continue  review protocol and progression    Person(s) Educated  Patient    Methods  Explanation;Demonstration    Comprehension  Verbalized understanding       PT Short Term Goals - 07/17/19 0954      PT SHORT TERM GOAL #1   Title  Patient will increase passive ER to 30 degrees    Baseline  improving    Time  4    Period  Weeks    Status  On-going      PT SHORT TERM GOAL #2   Title  Patient will increase passive flexion to 100 degrees    Time  4    Period  Weeks    Status  On-going    Target Date  07/29/19      PT SHORT TERM GOAL #3   Title  Patiewnt will begin light AAROM per protocol    Time  4    Period  Weeks    Status  On-going    Target Date  07/29/19        PT Long Term Goals - 07/01/19 1445      PT LONG TERM GOAL #1   Title  Patient will reach behind her head in order  to do her hair    Time  8    Period  Weeks    Status  New    Target Date  08/26/19      PT LONG TERM GOAL #2   Title  Patient will reach behind her back to L3 in order to perfrom daily tasks    Time  8    Period  Weeks    Status  New      PT LONG TERM GOAL #3   Title  Patient will reach overhead to shelf to grab object without increased pain in order to perfrom ADL's    Time  8    Period  Weeks    Status  New    Target Date  08/26/19            Plan - 07/19/19 1039    Clinical Impression Statement  Patient is makming great progress. hse is falling right where she should in the protocol with motion. She is having very little pain. She will be at 5 weeks next week. We will proceed per protocol.    Examination-Activity Limitations  Reach Overhead;Self Feeding;Carry    Examination-Participation Restrictions  Cleaning;Personal Finances    Stability/Clinical Decision Making  Evolving/Moderate complexity    Rehab Potential  Good    PT Frequency  2x / week    PT Treatment/Interventions  ADLs/Self Care Home Management;Electrical Stimulation;Iontophoresis 4mg /ml Dexamethasone;Moist Heat;Traction;Ultrasound;DME Instruction;Functional mobility training;Therapeutic activities;Therapeutic exercise;Neuromuscular re-education;Patient/family education;Manual techniques;Passive range of motion;Taping;Scar mobilization    PT Next Visit Plan  continue with PROM until 5 weeks . [name] has Dr Selina Cooleyhandlers protocol    PT Home Exercise Plan  pendulum, scap retraction to neutral,    Consulted and Agree with Plan of Care  Patient       Patient will benefit from skilled therapeutic intervention in order to improve the following deficits and impairments:  Decreased range of motion, Decreased activity tolerance, Decreased strength, Pain, Postural dysfunction, Impaired flexibility, Increased muscle spasms, Decreased mobility, Improper body mechanics, Impaired perceived functional ability  Visit Diagnosis: 1.  Stiffness of left shoulder, not elsewhere classified   2. Acute pain of left shoulder   3.  Muscle weakness (generalized)   4. Cervicalgia        Problem List Patient Active Problem List   Diagnosis Date Noted  . Polyneuropathy 03/20/2019  . Numbness 03/20/2019  . Gait disturbance 03/20/2019  . Impingement syndrome of right shoulder 05/16/2017  . Special screening for malignant neoplasms, colon 11/28/2011  . Diverticulosis of colon (without mention of hemorrhage) 11/28/2011  . Abdominal pain 11/15/2011  . Polyp of gallbladder 11/15/2011    Carney Living  PT DPT  07/19/2019, 11:33 AM  [facility] [address] [place], [place], [address] Phone: [phone]   Fax:  [phone]  Name: JUHI LAGRANGE MRN: [id number] Date of Birth: 08/13/1956

## 2019-07-22 ENCOUNTER — Encounter: Payer: Self-pay | Admitting: Physical Therapy

## 2019-07-22 ENCOUNTER — Other Ambulatory Visit: Payer: Self-pay

## 2019-07-22 ENCOUNTER — Ambulatory Visit: Payer: 59 | Attending: Orthopedic Surgery | Admitting: Physical Therapy

## 2019-07-22 DIAGNOSIS — M25612 Stiffness of left shoulder, not elsewhere classified: Secondary | ICD-10-CM | POA: Insufficient documentation

## 2019-07-22 DIAGNOSIS — M542 Cervicalgia: Secondary | ICD-10-CM | POA: Diagnosis present

## 2019-07-22 DIAGNOSIS — M6281 Muscle weakness (generalized): Secondary | ICD-10-CM | POA: Diagnosis present

## 2019-07-22 DIAGNOSIS — M25512 Pain in left shoulder: Secondary | ICD-10-CM | POA: Diagnosis present

## 2019-07-22 NOTE — Therapy (Signed)
[facility] [address] [place], [place], [address] Phone: [phone]   Fax:  [phone]  Physical Therapy Treatment  Patient Details  Name: Julia Kim MRN: [id number] Date of Birth: 08/13/1956 Referring Provider (PT): Dr Jones BroomJustin Chandler    Encounter Date: 07/22/2019  PT End of Session - 07/22/19 1044    Visit Number  7    Number of Visits  16    Date for PT Re-Evaluation  08/26/19    Authorization Type  [facility]    PT Start Time  1015    PT Stop Time  1055    PT Time Calculation (min)  40 min    Activity Tolerance  Patient tolerated treatment well    Behavior During Therapy  [facility] for tasks assessed/performed       Past Medical History:  Diagnosis Date  . Allergic sinusitis   . 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 Full dentures   . GERD (gastroesophageal reflux disease)   . Glaucoma, both eyes   . Hiatal hernia   . Hyperlipidemia   . Hyperlipidemia   . Hypertension   . Partial tear of left rotator cuff   . Vitamin D deficiency   . Wears glasses     Past Surgical History:  Procedure Laterality Date  . CESAREAN SECTION  1975  . COLONOSCOPY    . FOOT SURGERY Right 2002  . SHOULDER ARTHROSCOPY WITH OPEN ROTATOR CUFF REPAIR Left 06/17/2019   Procedure: SHOULDER ARTHROSCOPY WITH OPEN ROTATOR CUFF REPAIR, SUBACROMIAL DECOMPRESSION;  Surgeon: Jones Broomhandler, Justin, MD;  [facility];  Service: Orthopedics;  Laterality: Left;  . TRIGGER FINGER RELEASE Right 09/15/2017   Procedure: RELEASE TRIGGER FINGER/A-1 PULLEY RIGHT THUMB;  Surgeon: Cindee SaltKuzma, Gary, MD;  Location: [facility];  Service: Orthopedics;  Laterality: Right;  Bier block  . ULNAR NERVE TRANSPOSITION Right 09/20/2013   Procedure: RIGHT ULNAR NERVE DECOMPRESSION;  Surgeon: Nicki ReaperGary R Kuzma, MD;  Location: [facility];  Service: Orthopedics;  Laterality: Right;  . UPPER GASTROINTESTINAL ENDOSCOPY    . UPPER GI  ENDOSCOPY      There were no vitals filed for this visit.  Subjective Assessment - 07/22/19 1020    Subjective  Patient continues to have no complaints. She is not having any pain today. She is at 5 weeks post op today.    Patient Stated Goals  Nothing post-op                       [facility] Adult PT Treatment/Exercise - 07/22/19 0001      Shoulder Exercises: Supine   External Rotation  10 reps    External Rotation Limitations  wand ER with mofd cuing 2x10 5 sec hold     Flexion Limitations  2x10 3 sec hold       Shoulder Exercises: Standing   Row  20 reps    Theraband Level (Shoulder Row)  Level 1 (Yellow)      Shoulder Exercises: Pulleys   Flexion Limitations  2 min mod cuing for slow flexion       Manual Therapy   Manual Therapy  Passive ROM;Joint mobilization    Joint Mobilization  grade I and II PA and AP mobilizations to the right shoulder.     Passive ROM  Left shoulder PROM within MD protocol parameters             PT Education - 07/22/19 1043  Education Details  reviewed AAROM or home and updated HEP    Person(s) Educated  Patient    Methods  Explanation;Demonstration;Tactile cues;Verbal cues    Comprehension  Returned demonstration;Need further instruction;Verbal cues required       PT Short Term Goals - 07/22/19 1720      PT SHORT TERM GOAL #1   Title  Patient will increase passive ER to 30 degrees    Baseline  improving    Time  4    Period  Weeks    Status  On-going    Target Date  07/29/19      PT SHORT TERM GOAL #2   Title  Patient will increase passive flexion to 100 degrees    Baseline  100 degrees per visiual inspection    Time  4    Period  Weeks    Status  Achieved      PT SHORT TERM GOAL #3   Title  Patiewnt will begin light AAROM per protocol    Time  4    Period  Weeks    Status  On-going        PT Long Term Goals - 07/01/19 1445      PT LONG TERM GOAL #1   Title  Patient will reach behind her head in order  to do her hair    Time  8    Period  Weeks    Status  New    Target Date  08/26/19      PT LONG TERM GOAL #2   Title  Patient will reach behind her back to L3 in order to perfrom daily tasks    Time  8    Period  Weeks    Status  New      PT LONG TERM GOAL #3   Title  Patient will reach overhead to shelf to grab object without increased pain in order to perfrom ADL's    Time  8    Period  Weeks    Status  New    Target Date  08/26/19            Plan - 07/22/19 1044    Clinical Impression Statement  Patient continues to make great progress. Her range is progressing towards end ranges with very little resitance or pain. She was given pulley execises and wand exercises today with no pain. She was also given light resitance for scpaular strengthening. She had no pain.    Examination-Activity Limitations  Reach Overhead;Self Feeding;Carry    Examination-Participation Restrictions  Cleaning;Personal Finances    Clinical Decision Making  Moderate    Rehab Potential  Good    PT Frequency  2x / week    PT Duration  8 weeks    PT Treatment/Interventions  ADLs/Self Care Home Management;Electrical Stimulation;Iontophoresis 4mg /ml Dexamethasone;Moist Heat;Traction;Ultrasound;DME Instruction;Functional mobility training;Therapeutic activities;Therapeutic exercise;Neuromuscular re-education;Patient/family education;Manual techniques;Passive range of motion;Taping;Scar mobilization    PT Next Visit Plan  continue with PROM until 5 weeks . [name] has Dr Selina Cooleyhandlers protocol    PT Home Exercise Plan  pendulum, scap retraction to neutral,    Consulted and Agree with Plan of Care  Patient       Patient will benefit from skilled therapeutic intervention in order to improve the following deficits and impairments:  Decreased range of motion, Decreased activity tolerance, Decreased strength, Pain, Postural dysfunction, Impaired flexibility, Increased muscle spasms, Decreased mobility, Improper body  mechanics, Impaired perceived functional ability  Visit Diagnosis: 1. Stiffness  of left shoulder, not elsewhere classified   2. Acute pain of left shoulder   3. Muscle weakness (generalized)        Problem List Patient Active Problem List   Diagnosis Date Noted  . Polyneuropathy 03/20/2019  . Numbness 03/20/2019  . Gait disturbance 03/20/2019  . Impingement syndrome of right shoulder 05/16/2017  . Special screening for malignant neoplasms, colon 11/28/2011  . Diverticulosis of colon (without mention of hemorrhage) 11/28/2011  . Abdominal pain 11/15/2011  . Polyp of gallbladder 11/15/2011    Julia Kim 07/22/2019, 5:25 PM  [facility] [address] [place], [place], [address] Phone: [phone]   Fax:  [phone]  Name: Julia Kim MRN: [id number] Date of Birth: 03/05/1956

## 2019-07-24 ENCOUNTER — Encounter: Payer: Self-pay | Admitting: Physical Therapy

## 2019-07-24 ENCOUNTER — Other Ambulatory Visit: Payer: Self-pay

## 2019-07-24 ENCOUNTER — Ambulatory Visit: Payer: 59 | Admitting: Physical Therapy

## 2019-07-24 DIAGNOSIS — M25612 Stiffness of left shoulder, not elsewhere classified: Secondary | ICD-10-CM | POA: Diagnosis not present

## 2019-07-24 DIAGNOSIS — M542 Cervicalgia: Secondary | ICD-10-CM | POA: Diagnosis not present

## 2019-07-24 DIAGNOSIS — M25512 Pain in left shoulder: Secondary | ICD-10-CM | POA: Diagnosis not present

## 2019-07-24 DIAGNOSIS — M6281 Muscle weakness (generalized): Secondary | ICD-10-CM

## 2019-07-24 NOTE — Therapy (Signed)
[facility] [place], [place], [address] Phone: [phone]   Fax:  [phone]  Physical Therapy Treatment/ Progress note  Patient Details  Name: Julia RopesCheryl A Troiani MRN: [id number] Date of Birth: 04/17/1956 Referring Provider (PT): Dr Jones BroomJustin Chandler    Encounter Date: 07/24/2019  PT End of Session - 07/24/19 1353    Visit Number  8    Number of Visits  16    Date for PT Re-Evaluation  08/26/19    Authorization Type  [facility]    PT Start Time  1015    PT Stop Time  1056    PT Time Calculation (min)  41 min    Activity Tolerance  Patient tolerated treatment well    Behavior During Therapy  [facility] for tasks assessed/performed       Past Medical History:  Diagnosis Date  . Allergic sinusitis   . 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 Full dentures   . GERD (gastroesophageal reflux disease)   . Glaucoma, both eyes   . Hiatal hernia   . Hyperlipidemia   . Hyperlipidemia   . Hypertension   . Partial tear of left rotator cuff   . Vitamin D deficiency   . Wears glasses     Past Surgical History:  Procedure Laterality Date  . CESAREAN SECTION  1975  . COLONOSCOPY    . FOOT SURGERY Right 2002  . SHOULDER ARTHROSCOPY WITH OPEN ROTATOR CUFF REPAIR Left 06/17/2019   Procedure: SHOULDER ARTHROSCOPY WITH OPEN ROTATOR CUFF REPAIR, SUBACROMIAL DECOMPRESSION;  Surgeon: Jones Broomhandler, Justin, MD;  [facility];  Service: Orthopedics;  Laterality: Left;  . TRIGGER FINGER RELEASE Right 09/15/2017   Procedure: RELEASE TRIGGER FINGER/A-1 PULLEY RIGHT THUMB;  Surgeon: Cindee SaltKuzma, Gary, MD;  Location: [facility];  Service: Orthopedics;  Laterality: Right;  Bier block  . ULNAR NERVE TRANSPOSITION Right 09/20/2013   Procedure: RIGHT ULNAR NERVE DECOMPRESSION;  Surgeon: Nicki ReaperGary R Kuzma, MD;  [facility];  Service: Orthopedics;  Laterality: Right;  . UPPER GASTROINTESTINAL ENDOSCOPY    .  UPPER GI ENDOSCOPY      There were no vitals filed for this visit.  Subjective Assessment - 07/24/19 1019    Subjective  Patient report minor soreness after the last visit but overall she is doing well. She is having no pain this morning.    Patient Stated Goals  Nothing post-op    Currently in Pain?  No/denies         [facility] - 07/24/19 0001      PROM   Left Shoulder Flexion  135 Degrees    Left Shoulder Internal Rotation  65 Degrees    Left Shoulder External Rotation  50 Degrees      Palpation   Palpation comment  no unexpected tenderness to palaption                    [facility] Adult PT Treatment/Exercise - 07/24/19 0001      Shoulder Exercises: Supine   External Rotation Limitations  wand ER with mofd cuing 2x10 5 sec hold     Flexion Limitations  2x10 3 sec hold,      Shoulder Exercises: Standing   Extension  20 reps    Theraband Level (Shoulder Extension)  Level 1 (Yellow)    Row  20 reps    Theraband Level (Shoulder Row)  Level 1 (Yellow)      Shoulder  Exercises: Pulleys   Flexion Limitations  2 min mod cuing for slow flexion     Scaption Limitations  2 min       Manual Therapy   Manual Therapy  Passive ROM;Joint mobilization    Joint Mobilization  grade I and II PA and AP mobilizations to the right shoulder.     Passive ROM  Left shoulder PROM within MD protocol parameters             PT Education - 07/24/19 1353    Education Details  reviewed ther-ex    Person(s) Educated  Patient    Methods  Explanation;Demonstration;Tactile cues;Verbal cues    Comprehension  Verbalized understanding;Returned demonstration;Verbal cues required;Tactile cues required       PT Short Term Goals - 07/24/19 1402      PT SHORT TERM GOAL #1   Title  Patient will increase passive ER to 30 degrees    Baseline  50    Time  4    Period  Weeks    Status  Achieved      PT SHORT TERM GOAL #2   Title  Patient will increase passive flexion to 100  degrees    Baseline  135 after stretching    Time  4    Period  Weeks    Status  Achieved      PT SHORT TERM GOAL #3   Title  Patiewnt will begin light AAROM per protocol    Baseline  has begun without difficulty    Time  4    Period  Weeks    Status  Achieved        PT Long Term Goals - 07/01/19 1445      PT LONG TERM GOAL #1   Title  Patient will reach behind her head in order to do her hair    Time  8    Period  Weeks    Status  New    Target Date  08/26/19      PT LONG TERM GOAL #2   Title  Patient will reach behind her back to L3 in order to perfrom daily tasks    Time  8    Period  Weeks    Status  New      PT LONG TERM GOAL #3   Title  Patient will reach overhead to shelf to grab object without increased pain in order to perfrom ADL's    Time  8    Period  Weeks    Status  New    Target Date  08/26/19            Plan - 07/24/19 1354    Clinical Impression Statement  Patient is making great progress. her tranges are progressing well towards end range. She is having very little pain. She has began AAROm exercises without and increase in pain. next week she will begin isometrics per protocol.See above for ranges.    Personal Factors and Comorbidities  Comorbidity 1    Comorbidities  lupis    Examination-Activity Limitations  Reach Overhead;Self Feeding;Carry    Clinical Decision Making  Moderate    Rehab Potential  Good    PT Frequency  2x / week    PT Duration  8 weeks    PT Treatment/Interventions  ADLs/Self Care Home Management;Electrical Stimulation;Iontophoresis 4mg /ml Dexamethasone;Moist Heat;Traction;Ultrasound;DME Instruction;Functional mobility training;Therapeutic activities;Therapeutic exercise;Neuromuscular re-education;Patient/family education;Manual techniques;Passive range of motion;Taping;Scar mobilization    PT Next Visit Plan  continue  with PROM until 5 weeks . [name] has Dr Selina Cooleyhandlers protocol    PT Home Exercise Plan  pendulum, scap  retraction to neutral,    Consulted and Agree with Plan of Care  Patient       Patient will benefit from skilled therapeutic intervention in order to improve the following deficits and impairments:  Decreased range of motion, Decreased activity tolerance, Decreased strength, Pain, Postural dysfunction, Impaired flexibility, Increased muscle spasms, Decreased mobility, Improper body mechanics, Impaired perceived functional ability  Visit Diagnosis: 1. Stiffness of left shoulder, not elsewhere classified   2. Acute pain of left shoulder   3. Muscle weakness (generalized)   4. Cervicalgia        Problem List Patient Active Problem List   Diagnosis Date Noted  . Polyneuropathy 03/20/2019  . Numbness 03/20/2019  . Gait disturbance 03/20/2019  . Impingement syndrome of right shoulder 05/16/2017  . Special screening for malignant neoplasms, colon 11/28/2011  . Diverticulosis of colon (without mention of hemorrhage) 11/28/2011  . Abdominal pain 11/15/2011  . Polyp of gallbladder 11/15/2011    Dessie Comaavid J Darnice Comrie PT DPT  07/24/2019, 3:02 PM  [facility] [address] [place], [place], [address] Phone: [phone]   Fax:  [phone]  Name: Julia RopesCheryl A Schoppe MRN: [id number] Date of Birth: 09/10/1956

## 2019-07-31 ENCOUNTER — Ambulatory Visit: Payer: 59 | Admitting: Physical Therapy

## 2019-07-31 ENCOUNTER — Other Ambulatory Visit: Payer: Self-pay

## 2019-07-31 ENCOUNTER — Encounter: Payer: Self-pay | Admitting: Physical Therapy

## 2019-07-31 DIAGNOSIS — M25612 Stiffness of left shoulder, not elsewhere classified: Secondary | ICD-10-CM

## 2019-07-31 DIAGNOSIS — M6281 Muscle weakness (generalized): Secondary | ICD-10-CM | POA: Diagnosis not present

## 2019-07-31 DIAGNOSIS — M25512 Pain in left shoulder: Secondary | ICD-10-CM | POA: Diagnosis not present

## 2019-07-31 DIAGNOSIS — M542 Cervicalgia: Secondary | ICD-10-CM | POA: Diagnosis not present

## 2019-07-31 NOTE — Therapy (Signed)
[facility] [address] [place], [place], [address] Phone: [phone]   Fax:  [phone]  Physical Therapy Treatment  Patient Details  Name: Julia Kim MRN: [id number] Date of Birth: 10/25/1956 Referring Provider (PT): Dr Jones BroomJustin Chandler    Encounter Date: 07/31/2019  PT End of Session - 07/31/19 1309    Visit Number  9    Number of Visits  16    Date for PT Re-Evaluation  08/26/19    Authorization Type  [facility]    PT Start Time  1125    PT Stop Time  1210    PT Time Calculation (min)  45 min    Activity Tolerance  Patient tolerated treatment well    Behavior During Therapy  [facility] for tasks assessed/performed       Past Medical History:  Diagnosis Date  . Allergic sinusitis   . 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 Full dentures   . GERD (gastroesophageal reflux disease)   . Glaucoma, both eyes   . Hiatal hernia   . Hyperlipidemia   . Hyperlipidemia   . Hypertension   . Partial tear of left rotator cuff   . Vitamin D deficiency   . Wears glasses     Past Surgical History:  Procedure Laterality Date  . CESAREAN SECTION  1975  . COLONOSCOPY    . FOOT SURGERY Right 2002  . SHOULDER ARTHROSCOPY WITH OPEN ROTATOR CUFF REPAIR Left 06/17/2019   Procedure: SHOULDER ARTHROSCOPY WITH OPEN ROTATOR CUFF REPAIR, SUBACROMIAL DECOMPRESSION;  Surgeon: Jones Broomhandler, Justin, MD;  [facility];  Service: Orthopedics;  Laterality: Left;  . TRIGGER FINGER RELEASE Right 09/15/2017   Procedure: RELEASE TRIGGER FINGER/A-1 PULLEY RIGHT THUMB;  Surgeon: Cindee SaltKuzma, Gary, MD;  Location: [facility];  Service: Orthopedics;  Laterality: Right;  Bier block  . ULNAR NERVE TRANSPOSITION Right 09/20/2013   Procedure: RIGHT ULNAR NERVE DECOMPRESSION;  Surgeon: Nicki ReaperGary R Kuzma, MD;  Location: [facility];  Service: Orthopedics;  Laterality: Right;  . UPPER GASTROINTESTINAL ENDOSCOPY    . UPPER GI  ENDOSCOPY      There were no vitals filed for this visit.  Subjective Assessment - 07/31/19 1307    Subjective  Pt states she is doing well, she has been doing HEP.    Currently in Pain?  No/denies                       [facility] Treatment/Exercise - 07/31/19 1126      Shoulder Exercises: Supine   External Rotation Limitations  wand ER with mofd cuing 2x10 5 sec hold     Flexion  15 reps    Flexion Limitations  wand 5 sec holds    Other Supine Exercises  AAROM 2 hands 5 sec holds x10;       Shoulder Exercises: Standing   Extension  --    Theraband Level (Shoulder Extension)  --    Row  20 reps    Theraband Level (Shoulder Row)  Level 1 (Yellow)    Other Standing Exercises  Isometrics: Flex, Ext, Add, Abd, ER all x10, 5 sec       Shoulder Exercises: Pulleys   Flexion Limitations  2 min mod cuing for slow flexion     Scaption Limitations  2 min       Manual Therapy   Manual Therapy  Passive ROM;Joint mobilization    Joint Mobilization  grade I and II PA and AP mobilizations to the right shoulder.     Passive ROM  Left shoulder PROM within MD protocol parameters             PT Education - 07/31/19 1309    Education Details  updated HEP,    Person(s) Educated  Patient    Methods  Explanation;Handout    Comprehension  Verbalized understanding       PT Short Term Goals - 07/24/19 1402      PT SHORT TERM GOAL #1   Title  Patient will increase passive ER to 30 degrees    Baseline  50    Time  4    Period  Weeks    Status  Achieved      PT SHORT TERM GOAL #2   Title  Patient will increase passive flexion to 100 degrees    Baseline  135 after stretching    Time  4    Period  Weeks    Status  Achieved      PT SHORT TERM GOAL #3   Title  Patiewnt will begin light AAROM per protocol    Baseline  has begun without difficulty    Time  4    Period  Weeks    Status  Achieved        PT Long Term Goals - 07/01/19 1445      PT LONG TERM  GOAL #1   Title  Patient will reach behind her head in order to do her hair    Time  8    Period  Weeks    Status  New    Target Date  08/26/19      PT LONG TERM GOAL #2   Title  Patient will reach behind her back to L3 in order to perfrom daily tasks    Time  8    Period  Weeks    Status  New      PT LONG TERM GOAL #3   Title  Patient will reach overhead to shelf to grab object without increased pain in order to perfrom ADL's    Time  8    Period  Weeks    Status  New    Target Date  08/26/19            Plan - 07/31/19 1315    Clinical Impression Statement  Pt making good progress. She was given Supine PROM/AAROM for self ROM to improve flexion. Added Isometrics today, given for HEP.    Personal Factors and Comorbidities  Comorbidity 1    Comorbidities  lupis    Examination-Activity Limitations  Reach Overhead;Self Feeding;Carry    Rehab Potential  Good    PT Frequency  2x / week    PT Duration  8 weeks    PT Treatment/Interventions  ADLs/Self Care Home Management;Electrical Stimulation;Iontophoresis 4mg /ml Dexamethasone;Moist Heat;Traction;Ultrasound;DME Instruction;Functional mobility training;Therapeutic activities;Therapeutic exercise;Neuromuscular re-education;Patient/family education;Manual techniques;Passive range of motion;Taping;Scar mobilization    PT Next Visit Plan  continue with PROM until 5 weeks . [name] has Dr Selina Cooleyhandlers protocol    PT Home Exercise Plan  pendulum, scap retraction to neutral,    Consulted and Agree with Plan of Care  Patient       Patient will benefit from skilled therapeutic intervention in order to improve the following deficits and impairments:  Decreased range of motion, Decreased activity tolerance, Decreased strength, Pain, Postural dysfunction, Impaired flexibility, Increased muscle spasms, Decreased mobility, Improper  body mechanics, Impaired perceived functional ability  Visit Diagnosis: 1. Stiffness of left shoulder, not elsewhere  classified   2. Acute pain of left shoulder   3. Muscle weakness (generalized)        Problem List Patient Active Problem List   Diagnosis Date Noted  . Polyneuropathy 03/20/2019  . Numbness 03/20/2019  . Gait disturbance 03/20/2019  . Impingement syndrome of right shoulder 05/16/2017  . Special screening for malignant neoplasms, colon 11/28/2011  . Diverticulosis of colon (without mention of hemorrhage) 11/28/2011  . Abdominal pain 11/15/2011  . Polyp of gallbladder 11/15/2011    Lyndee Hensen, PT, DPT 1:25 PM  07/31/19    [facility] [address] [place], [place], [address] Phone: [phone]   Fax:  [phone]  Name: Julia Kim MRN: [id number] Date of Birth: 17-Apr-1956

## 2019-08-02 ENCOUNTER — Ambulatory Visit: Payer: 59 | Admitting: Physical Therapy

## 2019-08-06 ENCOUNTER — Other Ambulatory Visit: Payer: Self-pay

## 2019-08-06 ENCOUNTER — Encounter: Payer: Self-pay | Admitting: Physical Therapy

## 2019-08-06 ENCOUNTER — Ambulatory Visit: Payer: 59 | Admitting: Physical Therapy

## 2019-08-06 DIAGNOSIS — M25512 Pain in left shoulder: Secondary | ICD-10-CM | POA: Diagnosis not present

## 2019-08-06 DIAGNOSIS — M25612 Stiffness of left shoulder, not elsewhere classified: Secondary | ICD-10-CM | POA: Diagnosis not present

## 2019-08-06 DIAGNOSIS — M6281 Muscle weakness (generalized): Secondary | ICD-10-CM | POA: Diagnosis not present

## 2019-08-06 DIAGNOSIS — M542 Cervicalgia: Secondary | ICD-10-CM | POA: Diagnosis not present

## 2019-08-07 ENCOUNTER — Encounter: Payer: Self-pay | Admitting: Physical Therapy

## 2019-08-07 NOTE — Therapy (Signed)
[place], [place], [address] Phone: [phone]   Fax:  [phone]  Physical Therapy Treatment  Patient Details  Name: Julia Kim MRN: [id number] Date of Birth: 11-27-1956 Referring Provider (PT): Dr Tania Ade    Encounter Date: 08/06/2019  PT End of Session - 08/07/19 1251    Visit Number  10    Number of Visits  16    Date for PT Re-Evaluation  08/26/19    Authorization Type  [facility] [employer]    PT Start Time  1015    PT Stop Time  1055    PT Time Calculation (min)  40 min    Activity Tolerance  Patient tolerated treatment well    Behavior During Therapy  [facility] for tasks assessed/performed       Past Medical History:  Diagnosis Date  . Allergic sinusitis   . 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 Full dentures   . GERD (gastroesophageal reflux disease)   . Glaucoma, both eyes   . Hiatal hernia   . Hyperlipidemia   . Hyperlipidemia   . Hypertension   . Partial tear of left rotator cuff   . Vitamin D deficiency   . Wears glasses     Past Surgical History:  Procedure Laterality Date  . CESAREAN SECTION  1975  . COLONOSCOPY    . FOOT SURGERY Right 2002  . SHOULDER ARTHROSCOPY WITH OPEN ROTATOR CUFF REPAIR Left 06/17/2019   Procedure: SHOULDER ARTHROSCOPY WITH OPEN ROTATOR CUFF REPAIR, SUBACROMIAL DECOMPRESSION;  Surgeon: Tania Ade, MD;  Location: [place];  Service: Orthopedics;  Laterality: Left;  . TRIGGER FINGER RELEASE Right 09/15/2017   Procedure: RELEASE TRIGGER FINGER/A-1 PULLEY RIGHT THUMB;  Surgeon: Daryll Brod, MD;  Location: [place];  Service: Orthopedics;  Laterality: Right;  Bier block  . ULNAR NERVE TRANSPOSITION Right 09/20/2013   Procedure: RIGHT ULNAR NERVE DECOMPRESSION;  Surgeon: Wynonia Sours, MD;  Location: [place];  Service: Orthopedics;  Laterality: Right;  . UPPER GASTROINTESTINAL ENDOSCOPY    . UPPER GI  ENDOSCOPY      There were no vitals filed for this visit.  Subjective Assessment - 08/06/19 1018    Subjective  Patient reports she has been to the MD who is happy with her progress. She has been having a little pain but overall she is doing well.    Patient Stated Goals  Nothing post-op    Currently in Pain?  No/denies    Pain Score  4     Pain Location  Shoulder    Pain Orientation  Left    Pain Descriptors / Indicators  Aching    Pain Type  Chronic pain    Pain Onset  1 to 4 weeks ago    Pain Frequency  Intermittent    Aggravating Factors   just comes on from time to time    Pain Relieving Factors  rest                       [facility] Adult PT Treatment/Exercise - 08/07/19 0001      Shoulder Exercises: Supine   External Rotation Limitations  wand ER with mofd cuing 2x10 5 sec hold     Flexion  15 reps    Flexion Limitations  wand 5 sec holds    Other Supine Exercises  AAROM 2 hands 5 sec holds x10;  Shoulder Exercises: Sidelying   Other Sidelying Exercises  sidelying ER       Shoulder Exercises: Standing   Extension  20 reps    Theraband Level (Shoulder Extension)  Level 1 (Yellow)    Row  20 reps    Theraband Level (Shoulder Row)  Level 1 (Yellow)    Other Standing Exercises  ISo ER       Shoulder Exercises: Pulleys   Flexion Limitations  2 min mod cuing for slow flexion     Scaption Limitations  2 min       Manual Therapy   Manual Therapy  Passive ROM;Joint mobilization    Joint Mobilization  grade I and II PA and AP mobilizations to the right shoulder.     Passive ROM  Left shoulder PROM within MD protocol parameters             PT Education - 08/07/19 1247    Education Details  updated to HEP, symptom management,    Person(s) Educated  Patient    Methods  Explanation;Demonstration;Tactile cues;Verbal cues    Comprehension  Verbalized understanding;Returned demonstration;Verbal cues required;Tactile cues required       PT Short  Term Goals - 08/07/19 1258      PT SHORT TERM GOAL #1   Title  Patient will increase passive ER to 30 degrees    Baseline  50    Time  4    Status  Achieved      PT SHORT TERM GOAL #2   Title  Patient will increase passive flexion to 100 degrees    Baseline  135 after stretching    Time  4    Period  Weeks    Status  Achieved      PT SHORT TERM GOAL #3   Title  Patiewnt will begin light AAROM per protocol    Baseline  has begun without difficulty    Time  4    Period  Weeks    Status  Achieved    Target Date  07/29/19        PT Long Term Goals - 07/01/19 1445      PT LONG TERM GOAL #1   Title  Patient will reach behind her head in order to do her hair    Time  8    Period  Weeks    Status  New    Target Date  08/26/19      PT LONG TERM GOAL #2   Title  Patient will reach behind her back to L3 in order to perfrom daily tasks    Time  8    Period  Weeks    Status  New      PT LONG TERM GOAL #3   Title  Patient will reach overhead to shelf to grab object without increased pain in order to perfrom ADL's    Time  8    Period  Weeks    Status  New    Target Date  08/26/19            Plan - 08/07/19 1255    Clinical Impression Statement  Therapy reviewed self ROM activity with the patient. She reuqired min cuing. Patient added in light sidelying active ER. She had no increase in pain. Overall she is making good progress. PT will continue to advanace per protocol.    Comorbidities  lupis    Examination-Activity Limitations  Reach Overhead;Self Feeding;Carry    Examination-Participation  Restrictions  Cleaning;Personal Finances    Stability/Clinical Decision Making  Evolving/Moderate complexity    Clinical Decision Making  Moderate    Rehab Potential  Good    PT Frequency  2x / week    PT Duration  8 weeks    PT Treatment/Interventions  ADLs/Self Care Home Management;Electrical Stimulation;Iontophoresis 4mg /ml Dexamethasone;Moist Heat;Traction;Ultrasound;DME  Instruction;Functional mobility training;Therapeutic activities;Therapeutic exercise;Neuromuscular re-education;Patient/family education;Manual techniques;Passive range of motion;Taping;Scar mobilization    PT Next Visit Plan  continue with PROM until 5 weeks . [name] has Dr Selina Cooleyhandlers protocol    PT Home Exercise Plan  pendulum, scap retraction to neutral, wand ER and flexion; scap retraction and extension    Consulted and Agree with Plan of Care  Patient       Patient will benefit from skilled therapeutic intervention in order to improve the following deficits and impairments:  Decreased range of motion, Decreased activity tolerance, Decreased strength, Pain, Postural dysfunction, Impaired flexibility, Increased muscle spasms, Decreased mobility, Improper body mechanics, Impaired perceived functional ability  Visit Diagnosis: 1. Stiffness of left shoulder, not elsewhere classified   2. Acute pain of left shoulder   3. Muscle weakness (generalized)        Problem List Patient Active Problem List   Diagnosis Date Noted  . Polyneuropathy 03/20/2019  . Numbness 03/20/2019  . Gait disturbance 03/20/2019  . Impingement syndrome of right shoulder 05/16/2017  . Special screening for malignant neoplasms, colon 11/28/2011  . Diverticulosis of colon (without mention of hemorrhage) 11/28/2011  . Abdominal pain 11/15/2011  . Polyp of gallbladder 11/15/2011    Dessie Comaavid J Doyel Mulkern PT DPT  08/07/2019, 1:00 PM  [facility] [address] [place], [place], [address] Phone: [phone]   Fax:  [phone]  Name: Julia Kim MRN: [id number] Date of Birth: 01/17/1956

## 2019-08-08 ENCOUNTER — Ambulatory Visit: Payer: 59 | Admitting: Physical Therapy

## 2019-08-08 ENCOUNTER — Other Ambulatory Visit: Payer: Self-pay

## 2019-08-08 ENCOUNTER — Encounter: Payer: Self-pay | Admitting: Physical Therapy

## 2019-08-08 DIAGNOSIS — M25512 Pain in left shoulder: Secondary | ICD-10-CM

## 2019-08-08 DIAGNOSIS — M542 Cervicalgia: Secondary | ICD-10-CM | POA: Diagnosis not present

## 2019-08-08 DIAGNOSIS — M6281 Muscle weakness (generalized): Secondary | ICD-10-CM | POA: Diagnosis not present

## 2019-08-08 DIAGNOSIS — M25612 Stiffness of left shoulder, not elsewhere classified: Secondary | ICD-10-CM | POA: Diagnosis not present

## 2019-08-08 NOTE — Therapy (Signed)
[facility] [address] [place], [place], [address] Phone: [phone]   Fax:  [phone]  Physical Therapy Treatment  Patient Details  Name: Julia Kim MRN: [id number] Date of Birth: 09/14/1956 Referring Provider (PT): Dr Jones BroomJustin Chandler    Encounter Date: 08/08/2019  PT End of Session - 08/08/19 0957    Visit Number  11    Number of Visits  16    Date for PT Re-Evaluation  08/26/19    Authorization Type  [facility]    PT Start Time  0930    PT Stop Time  1012    PT Time Calculation (min)  42 min    Activity Tolerance  Patient tolerated treatment well    Behavior During Therapy  [facility] for tasks assessed/performed       Past Medical History:  Diagnosis Date  . Allergic sinusitis   . 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 Full dentures   . GERD (gastroesophageal reflux disease)   . Glaucoma, both eyes   . Hiatal hernia   . Hyperlipidemia   . Hyperlipidemia   . Hypertension   . Partial tear of left rotator cuff   . Vitamin D deficiency   . Wears glasses     Past Surgical History:  Procedure Laterality Date  . CESAREAN SECTION  1975  . COLONOSCOPY    . FOOT SURGERY Right 2002  . SHOULDER ARTHROSCOPY WITH OPEN ROTATOR CUFF REPAIR Left 06/17/2019   Procedure: SHOULDER ARTHROSCOPY WITH OPEN ROTATOR CUFF REPAIR, SUBACROMIAL DECOMPRESSION;  Surgeon: Jones Broomhandler, Justin, MD;  [facility]  Service: Orthopedics;  Laterality: Left;  . TRIGGER FINGER RELEASE Right 09/15/2017   Procedure: RELEASE TRIGGER FINGER/A-1 PULLEY RIGHT THUMB;  Surgeon: Cindee SaltKuzma, Gary, MD;  Location: [facility];  Service: Orthopedics;  Laterality: Right;  Bier block  . ULNAR NERVE TRANSPOSITION Right 09/20/2013   Procedure: RIGHT ULNAR NERVE DECOMPRESSION;  Surgeon: Nicki ReaperGary R Kuzma, MD;  Location: [facility];  Service: Orthopedics;  Laterality: Right;  . UPPER GASTROINTESTINAL ENDOSCOPY    . UPPER GI  ENDOSCOPY      There were no vitals filed for this visit.  Subjective Assessment - 08/08/19 0955    Subjective  Patient reports minor pain in the front of her shoulder. She reports pain in the mornings but then the pain goes down.    Patient Stated Goals  Nothing post-op    Currently in Pain?  No/denies    Pain Orientation  Left    Pain Descriptors / Indicators  Aching    Pain Type  Chronic pain    Pain Onset  1 to 4 weeks ago    Pain Frequency  Intermittent    Aggravating Factors   in the morning    Pain Relieving Factors  rest    Effect of Pain on Daily Activities  unable to use her right shoulder                       [facility] Adult PT Treatment/Exercise - 08/08/19 0001      Shoulder Exercises: Supine   External Rotation Limitations  wand ER with mofd cuing 2x10 5 sec hold     Flexion Limitations  no wand 2x10     Other Supine Exercises  AAROM 2 hands 5 sec holds x10;       Shoulder Exercises: Sidelying   Other Sidelying Exercises  sidelying ER 2x10  Shoulder Exercises: Standing   Theraband Level (Shoulder Internal Rotation)  Level 2 (Red)    Internal Rotation Limitations  2x10    Extension  20 reps    Theraband Level (Shoulder Extension)  Level 2 (Red)    Row  20 reps    Theraband Level (Shoulder Row)  Level 2 (Red)      Shoulder Exercises: Pulleys   Flexion Limitations  2 min mod cuing for slow flexion     Scaption Limitations  2 min       Manual Therapy   Manual Therapy  Passive ROM;Joint mobilization    Joint Mobilization  grade I and II PA and AP mobilizations to the right shoulder.     Passive ROM  Left shoulder PROM within MD protocol parameters             PT Education - 08/08/19 0956    Education Details  activity progression    Person(s) Educated  Patient    Methods  Explanation;Demonstration;Tactile cues;Verbal cues    Comprehension  Verbalized understanding;Returned demonstration       PT Short Term Goals - 08/07/19 1258       PT SHORT TERM GOAL #1   Title  Patient will increase passive ER to 30 degrees    Baseline  50    Time  4    Status  Achieved      PT SHORT TERM GOAL #2   Title  Patient will increase passive flexion to 100 degrees    Baseline  135 after stretching    Time  4    Period  Weeks    Status  Achieved      PT SHORT TERM GOAL #3   Title  Patiewnt will begin light AAROM per protocol    Baseline  has begun without difficulty    Time  4    Period  Weeks    Status  Achieved    Target Date  07/29/19        PT Long Term Goals - 07/01/19 1445      PT LONG TERM GOAL #1   Title  Patient will reach behind her head in order to do her hair    Time  8    Period  Weeks    Status  New    Target Date  08/26/19      PT LONG TERM GOAL #2   Title  Patient will reach behind her back to L3 in order to perfrom daily tasks    Time  8    Period  Weeks    Status  New      PT LONG TERM GOAL #3   Title  Patient will reach overhead to shelf to grab object without increased pain in order to perfrom ADL's    Time  8    Period  Weeks    Status  New    Target Date  08/26/19            Plan - 08/08/19 [id number]    Clinical Impression Statement  Patient continues to make great progress she was able to begin active flexion in supine today. She had minor pain in the front of her shoulder. Therapy alos added protraction.,    Personal Factors and Comorbidities  Comorbidity 1    Comorbidities  lupis    Examination-Activity Limitations  Reach Overhead;Self Feeding;Carry    Stability/Clinical Decision Making  Evolving/Moderate complexity    Clinical Decision Making  Moderate    Rehab Potential  Good    PT Frequency  2x / week    PT Duration  8 weeks    PT Treatment/Interventions  ADLs/Self Care Home Management;Electrical Stimulation;Iontophoresis 4mg /ml Dexamethasone;Moist Heat;Traction;Ultrasound;DME Instruction;Functional mobility training;Therapeutic activities;Therapeutic exercise;Neuromuscular  re-education;Patient/family education;Manual techniques;Passive range of motion;Taping;Scar mobilization    PT Next Visit Plan  continue with PROM until 5 weeks . Waunita Schooner has Dr Rodman Key protocol    PT Home Exercise Plan  pendulum, scap retraction to neutral, wand ER and flexion; scap retraction and extension    Consulted and Agree with Plan of Care  Patient       Patient will benefit from skilled therapeutic intervention in order to improve the following deficits and impairments:  Decreased range of motion, Decreased activity tolerance, Decreased strength, Pain, Postural dysfunction, Impaired flexibility, Increased muscle spasms, Decreased mobility, Improper body mechanics, Impaired perceived functional ability  Visit Diagnosis: Stiffness of left shoulder, not elsewhere classified  Acute pain of left shoulder  Muscle weakness (generalized)     Problem List Patient Active Problem List   Diagnosis Date Noted  . Polyneuropathy 03/20/2019  . Numbness 03/20/2019  . Gait disturbance 03/20/2019  . Impingement syndrome of right shoulder 05/16/2017  . Special screening for malignant neoplasms, colon 11/28/2011  . Diverticulosis of colon (without mention of hemorrhage) 11/28/2011  . Abdominal pain 11/15/2011  . Polyp of gallbladder 11/15/2011    Carney Living PT DPT  08/08/2019, 10:16 AM  [facility] [address] [place], [place], [address] Phone: [phone]   Fax:  [phone]  Name: Julia Kim MRN: [id number] Date of Birth: [birth year]

## 2019-08-13 ENCOUNTER — Other Ambulatory Visit: Payer: Self-pay

## 2019-08-13 ENCOUNTER — Encounter: Payer: Self-pay | Admitting: Physical Therapy

## 2019-08-13 ENCOUNTER — Encounter: Payer: 59 | Admitting: Physical Therapy

## 2019-08-13 ENCOUNTER — Ambulatory Visit: Payer: 59 | Admitting: Physical Therapy

## 2019-08-13 DIAGNOSIS — M25512 Pain in left shoulder: Secondary | ICD-10-CM | POA: Diagnosis not present

## 2019-08-13 DIAGNOSIS — M6281 Muscle weakness (generalized): Secondary | ICD-10-CM | POA: Diagnosis not present

## 2019-08-13 DIAGNOSIS — M542 Cervicalgia: Secondary | ICD-10-CM

## 2019-08-13 DIAGNOSIS — M25612 Stiffness of left shoulder, not elsewhere classified: Secondary | ICD-10-CM | POA: Diagnosis not present

## 2019-08-13 DIAGNOSIS — E782 Mixed hyperlipidemia: Secondary | ICD-10-CM | POA: Diagnosis not present

## 2019-08-13 DIAGNOSIS — E559 Vitamin D deficiency, unspecified: Secondary | ICD-10-CM | POA: Diagnosis not present

## 2019-08-13 NOTE — Therapy (Signed)
[place], [place], [address] Phone: [phone]   Fax:  [phone]  Physical Therapy Treatment  Patient Details  Name: Julia Kim MRN: [id number] Date of Birth: 02-27-1956 Referring Provider (PT): Dr Tania Ade    Encounter Date: 08/13/2019  PT End of Session - 08/13/19 1451    Visit Number  12    Number of Visits  16    Date for PT Re-Evaluation  08/26/19    Authorization Type  [facility]    PT Start Time  5188    PT Stop Time  1226    PT Time Calculation (min)  41 min    Activity Tolerance  Patient tolerated treatment well    Behavior During Therapy  [facility] for tasks assessed/performed       Past Medical History:  Diagnosis Date  . Allergic sinusitis   . 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 Full dentures   . GERD (gastroesophageal reflux disease)   . Glaucoma, both eyes   . Hiatal hernia   . Hyperlipidemia   . Hyperlipidemia   . Hypertension   . Partial tear of left rotator cuff   . Vitamin D deficiency   . Wears glasses     Past Surgical History:  Procedure Laterality Date  . CESAREAN SECTION  1975  . COLONOSCOPY    . FOOT SURGERY Right 2002  . SHOULDER ARTHROSCOPY WITH OPEN ROTATOR CUFF REPAIR Left 06/17/2019   Procedure: SHOULDER ARTHROSCOPY WITH OPEN ROTATOR CUFF REPAIR, SUBACROMIAL DECOMPRESSION;  Surgeon: Tania Ade, MD;  Location: [place];  Service: Orthopedics;  Laterality: Left;  . TRIGGER FINGER RELEASE Right 09/15/2017   Procedure: RELEASE TRIGGER FINGER/A-1 PULLEY RIGHT THUMB;  Surgeon: Daryll Brod, MD;  Location: [place];  Service: Orthopedics;  Laterality: Right;  Bier block  . ULNAR NERVE TRANSPOSITION Right 09/20/2013   Procedure: RIGHT ULNAR NERVE DECOMPRESSION;  Surgeon: Wynonia Sours, MD;  Location: [place];  Service: Orthopedics;  Laterality: Right;  . UPPER GASTROINTESTINAL ENDOSCOPY    . UPPER GI  ENDOSCOPY      There were no vitals filed for this visit.  Subjective Assessment - 08/13/19 1203    Subjective  Patient had a little soreness over the weekend. Today she is doing well. She felt good after the last visit.    Currently in Pain?  No/denies                       [facility] Adult PT Treatment/Exercise - 08/13/19 0001      Shoulder Exercises: Supine   Protraction Limitations  with 3lb wand     Flexion Limitations  no wand 2x10     Other Supine Exercises  wand 3 lb weight       Shoulder Exercises: Sidelying   Other Sidelying Exercises  sidelying ER 2x10 1 lb       Shoulder Exercises: Standing   Theraband Level (Shoulder Internal Rotation)  Level 2 (Red)    Internal Rotation Limitations  2x10    Extension  20 reps    Theraband Level (Shoulder Extension)  Level 3 (Green)    Row  20 reps    Theraband Level (Shoulder Row)  Level 3 (Green)      Shoulder Exercises: Pulleys   Flexion Limitations  2 min mod cuing for slow flexion     Scaption Limitations  2 min  Manual Therapy   Manual Therapy  Passive ROM;Joint mobilization    Joint Mobilization  grade I and II PA and AP mobilizations to the right shoulder.     Passive ROM  Left shoulder PROM within MD protocol parameters             PT Education - 08/13/19 1216    Education Details  reviewed HEP and       PT Short Term Goals - 08/07/19 1258      PT SHORT TERM GOAL #1   Title  Patient will increase passive ER to 30 degrees    Baseline  50    Time  4    Status  Achieved      PT SHORT TERM GOAL #2   Title  Patient will increase passive flexion to 100 degrees    Baseline  135 after stretching    Time  4    Period  Weeks    Status  Achieved      PT SHORT TERM GOAL #3   Title  Patiewnt will begin light AAROM per protocol    Baseline  has begun without difficulty    Time  4    Period  Weeks    Status  Achieved    Target Date  07/29/19        PT Long Term Goals - 07/01/19 1445       PT LONG TERM GOAL #1   Title  Patient will reach behind her head in order to do her hair    Time  8    Period  Weeks    Status  New    Target Date  08/26/19      PT LONG TERM GOAL #2   Title  Patient will reach behind her back to L3 in order to perfrom daily tasks    Time  8    Period  Weeks    Status  New      PT LONG TERM GOAL #3   Title  Patient will reach overhead to shelf to grab object without increased pain in order to perfrom ADL's    Time  8    Period  Weeks    Status  New    Target Date  08/26/19            Plan - 08/13/19 1453    Clinical Impression Statement  Patient continues to make greatprogress. Therapy advanced her bacnd exercises today. She rewported fatigue but no pain. She was able to activley reach her arm to about 120 degrees without pain. She was given a green band for scapular exercises but was strongly advised not to use it for her RTC exercises. She used a yellow for Er and red for IR. Her PROM is nearly full.    Personal Factors and Comorbidities  Comorbidity 1    Comorbidities  lupis    Examination-Activity Limitations  Reach Overhead;Self Feeding;Carry    Stability/Clinical Decision Making  Evolving/Moderate complexity    Clinical Decision Making  Moderate    Rehab Potential  Good    PT Frequency  2x / week    PT Duration  8 weeks    PT Treatment/Interventions  ADLs/Self Care Home Management;Electrical Stimulation;Iontophoresis 4mg /ml Dexamethasone;Moist Heat;Traction;Ultrasound;DME Instruction;Functional mobility training;Therapeutic activities;Therapeutic exercise;Neuromuscular re-education;Patient/family education;Manual techniques;Passive range of motion;Taping;Scar mobilization    PT Next Visit Plan  continue with PROM until 5 weeks . Theodoro Grist has Dr Selina Cooley protocol    PT Home Exercise Plan  pendulum, scap retraction to neutral, wand ER and flexion; scap retraction and extension    Consulted and Agree with Plan of Care  Patient        Patient will benefit from skilled therapeutic intervention in order to improve the following deficits and impairments:  Decreased range of motion, Decreased activity tolerance, Decreased strength, Pain, Postural dysfunction, Impaired flexibility, Increased muscle spasms, Decreased mobility, Improper body mechanics, Impaired perceived functional ability  Visit Diagnosis: Stiffness of left shoulder, not elsewhere classified  Acute pain of left shoulder  Muscle weakness (generalized)  Cervicalgia     Problem List Patient Active Problem List   Diagnosis Date Noted  . Polyneuropathy 03/20/2019  . Numbness 03/20/2019  . Gait disturbance 03/20/2019  . Impingement syndrome of right shoulder 05/16/2017  . Special screening for malignant neoplasms, colon 11/28/2011  . Diverticulosis of colon (without mention of hemorrhage) 11/28/2011  . Abdominal pain 11/15/2011  . Polyp of gallbladder 11/15/2011    Dessie Comaavid J Renne Cornick PT DPT  08/13/2019, 3:28 PM  [facility] [address] [place], [place], [address] Phone: [phone]   Fax:  [phone]  Name: Charleston RopesCheryl A Giambra MRN: [id number] Date of Birth: 01/10/1956

## 2019-08-15 ENCOUNTER — Ambulatory Visit: Payer: 59 | Admitting: Physical Therapy

## 2019-08-15 ENCOUNTER — Encounter: Payer: Self-pay | Admitting: Physical Therapy

## 2019-08-15 ENCOUNTER — Other Ambulatory Visit: Payer: Self-pay

## 2019-08-15 DIAGNOSIS — M25512 Pain in left shoulder: Secondary | ICD-10-CM | POA: Diagnosis not present

## 2019-08-15 DIAGNOSIS — M25612 Stiffness of left shoulder, not elsewhere classified: Secondary | ICD-10-CM | POA: Diagnosis not present

## 2019-08-15 DIAGNOSIS — M6281 Muscle weakness (generalized): Secondary | ICD-10-CM | POA: Diagnosis not present

## 2019-08-15 DIAGNOSIS — M542 Cervicalgia: Secondary | ICD-10-CM | POA: Diagnosis not present

## 2019-08-15 NOTE — Therapy (Signed)
[facility] [address] [place], [place], [address] Phone: [phone]   Fax:  [phone]  Physical Therapy Treatment  Patient Details  Name: Julia Kim MRN: [id number] Date of Birth: 03-Feb-1956 Referring Provider (PT): Dr Jones Broom    Encounter Date: 08/15/2019  PT End of Session - 08/15/19 0955    Visit Number  13    Number of Visits  16    Date for PT Re-Evaluation  08/26/19    Authorization Type  [facility]    PT Start Time  0930    PT Stop Time  1012    PT Time Calculation (min)  42 min    Activity Tolerance  Patient tolerated treatment well    Behavior During Therapy  [facility] for tasks assessed/performed       Past Medical History:  Diagnosis Date  . Allergic sinusitis   . 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 Full dentures   . GERD (gastroesophageal reflux disease)   . Glaucoma, both eyes   . Hiatal hernia   . Hyperlipidemia   . Hyperlipidemia   . Hypertension   . Partial tear of left rotator cuff   . Vitamin D deficiency   . Wears glasses     Past Surgical History:  Procedure Laterality Date  . CESAREAN SECTION  1975  . COLONOSCOPY    . FOOT SURGERY Right 2002  . SHOULDER ARTHROSCOPY WITH OPEN ROTATOR CUFF REPAIR Left 06/17/2019   Procedure: SHOULDER ARTHROSCOPY WITH OPEN ROTATOR CUFF REPAIR, SUBACROMIAL DECOMPRESSION;  Surgeon: Jones Broom, MD;  Location: [facility];  Service: Orthopedics;  Laterality: Left;  . TRIGGER FINGER RELEASE Right 09/15/2017   Procedure: RELEASE TRIGGER FINGER/A-1 PULLEY RIGHT THUMB;  Surgeon: Cindee Salt, MD;  Location: [facility];  Service: Orthopedics;  Laterality: Right;  Bier block  . ULNAR NERVE TRANSPOSITION Right 09/20/2013   Procedure: RIGHT ULNAR NERVE DECOMPRESSION;  Surgeon: Nicki Reaper, MD;  Location: [facility];  Service: Orthopedics;  Laterality: Right;  . UPPER GASTROINTESTINAL ENDOSCOPY    . UPPER GI  ENDOSCOPY      There were no vitals filed for this visit.  Subjective Assessment - 08/15/19 0951    Subjective  Patient reports her shoulder is a little stiff. She feels like it is always a little stiff in the monrng. she had minor anterior pain last visit.    Patient Stated Goals  Nothing post-op    Currently in Pain?  No/denies                       [facility] Adult PT Treatment/Exercise - 08/15/19 0001      Shoulder Exercises: Supine   Protraction Limitations  with 3lb wand     Flexion Limitations  no wand 2x10     Other Supine Exercises  wand 3 lb weight       Shoulder Exercises: Sidelying   Other Sidelying Exercises  sidelying ER 2x10 1 lb       Shoulder Exercises: Standing   Theraband Level (Shoulder Internal Rotation)  Level 2 (Red)    Internal Rotation Limitations  2x10    Extension  20 reps    Theraband Level (Shoulder Extension)  Level 3 (Green)    Row  20 reps    Theraband Level (Shoulder Row)  Level 3 (Green)      Shoulder Exercises: Pulleys   Flexion Limitations  2 min mod cuing for  slow flexion     Scaption Limitations  2 min       Shoulder Exercises: Stretch   Other Shoulder Stretches  internal rotation stretch with mulligan manip 5x10 sec hold     Other Shoulder Stretches  star gazer stretch 5x15 sec hold       Manual Therapy   Manual Therapy  Passive ROM;Joint mobilization    Joint Mobilization  grade I and II PA and AP mobilizations to the right shoulder.     Passive ROM  Left shoulder PROM within MD protocol parameters             PT Education - 08/15/19 0952    Education Details  HEP and symptom mangement    Person(s) Educated  Patient    Methods  Explanation;Tactile cues;Demonstration;Verbal cues    Comprehension  Verbalized understanding;Returned demonstration;Verbal cues required;Tactile cues required       PT Short Term Goals - 08/15/19 1005      PT SHORT TERM GOAL #1   Title  Patient will increase passive ER to 30  degrees    Baseline  50    Time  4    Period  Weeks    Status  Achieved      PT SHORT TERM GOAL #2   Title  Patient will increase passive flexion to 100 degrees    Time  4    Period  Weeks    Status  Achieved      PT SHORT TERM GOAL #3   Title  Patiewnt will begin light AAROM per protocol    Baseline  has begun without difficulty    Period  Weeks    Status  Achieved        PT Long Term Goals - 07/01/19 1445      PT LONG TERM GOAL #1   Title  Patient will reach behind her head in order to do her hair    Time  8    Period  Weeks    Status  New    Target Date  08/26/19      PT LONG TERM GOAL #2   Title  Patient will reach behind her back to L3 in order to perfrom daily tasks    Time  8    Period  Weeks    Status  New      PT LONG TERM GOAL #3   Title  Patient will reach overhead to shelf to grab object without increased pain in order to perfrom ADL's    Time  8    Period  Weeks    Status  New    Target Date  08/26/19            Plan - 08/15/19 0955    Clinical Impression Statement  Patient hadd some stiffness in ER today but her ER quickly improved. She was encouraged to continue stretching at home. She did not have as much anterior pain with forward flexion today. Patients functional ER and IR are improving. Therapy added IR stretching and Star gazer stretch. She had minor pain with each. She was advised to ice as we start to advance her exercises.    Examination-Activity Limitations  Reach Overhead;Self Feeding;Carry    Examination-Participation Restrictions  Cleaning;Personal Finances    Clinical Decision Making  Moderate    Rehab Potential  Good    PT Frequency  2x / week    PT Duration  8 weeks    PT  Treatment/Interventions  ADLs/Self Care Home Management;Electrical Stimulation;Iontophoresis 4mg /ml Dexamethasone;Moist Heat;Traction;Ultrasound;DME Instruction;Functional mobility training;Therapeutic activities;Therapeutic exercise;Neuromuscular  re-education;Patient/family education;Manual techniques;Passive range of motion;Taping;Scar mobilization    PT Next Visit Plan  continue with PROM until 5 weeks . Waunita Schooner has Dr Rodman Key protocol    PT Home Exercise Plan  pendulum, scap retraction to neutral, wand ER and flexion; scap retraction and extension       Patient will benefit from skilled therapeutic intervention in order to improve the following deficits and impairments:  Decreased range of motion, Decreased activity tolerance, Decreased strength, Pain, Postural dysfunction, Impaired flexibility, Increased muscle spasms, Decreased mobility, Improper body mechanics, Impaired perceived functional ability  Visit Diagnosis: Stiffness of left shoulder, not elsewhere classified  Acute pain of left shoulder  Muscle weakness (generalized)     Problem List Patient Active Problem List   Diagnosis Date Noted  . Polyneuropathy 03/20/2019  . Numbness 03/20/2019  . Gait disturbance 03/20/2019  . Impingement syndrome of right shoulder 05/16/2017  . Special screening for malignant neoplasms, colon 11/28/2011  . Diverticulosis of colon (without mention of hemorrhage) 11/28/2011  . Abdominal pain 11/15/2011  . Polyp of gallbladder 11/15/2011    Carney Living PT DPT 08/15/2019, 1:19 PM  [facility] [address] [place], [place], [address] Phone: [phone]   Fax:  [phone]  Name: Julia Kim MRN: [id number] Date of Birth: 1956/03/03

## 2019-08-20 ENCOUNTER — Other Ambulatory Visit: Payer: Self-pay

## 2019-08-20 ENCOUNTER — Encounter: Payer: Self-pay | Admitting: Physical Therapy

## 2019-08-20 ENCOUNTER — Ambulatory Visit: Payer: 59 | Attending: Orthopedic Surgery | Admitting: Physical Therapy

## 2019-08-20 DIAGNOSIS — M542 Cervicalgia: Secondary | ICD-10-CM | POA: Diagnosis present

## 2019-08-20 DIAGNOSIS — M6281 Muscle weakness (generalized): Secondary | ICD-10-CM | POA: Insufficient documentation

## 2019-08-20 DIAGNOSIS — M25512 Pain in left shoulder: Secondary | ICD-10-CM | POA: Insufficient documentation

## 2019-08-20 DIAGNOSIS — M25612 Stiffness of left shoulder, not elsewhere classified: Secondary | ICD-10-CM | POA: Insufficient documentation

## 2019-08-21 ENCOUNTER — Encounter: Payer: Self-pay | Admitting: Physical Therapy

## 2019-08-21 NOTE — Therapy (Signed)
[facility] [address] [place], [place], [address] Phone: [phone]   Fax:  [phone]  Physical Therapy Treatment  Patient Details  Name: Julia Kim MRN: [id number] Date of Birth: 05/09/1956 Referring Provider (PT): Dr Jones BroomJustin Chandler    Encounter Date: 08/20/2019  PT End of Session - 08/20/19 1637    Visit Number  14    Number of Visits  16    Date for PT Re-Evaluation  08/26/19    Authorization Type  [facility]    PT Start Time  1632    PT Stop Time  1712    PT Time Calculation (min)  40 min    Activity Tolerance  Patient tolerated treatment well    Behavior During Therapy  [facility] for tasks assessed/performed       Past Medical History:  Diagnosis Date  . Allergic sinusitis   . 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 Full dentures   . GERD (gastroesophageal reflux disease)   . Glaucoma, both eyes   . Hiatal hernia   . Hyperlipidemia   . Hyperlipidemia   . Hypertension   . Partial tear of left rotator cuff   . Vitamin D deficiency   . Wears glasses     Past Surgical History:  Procedure Laterality Date  . CESAREAN SECTION  1975  . COLONOSCOPY    . FOOT SURGERY Right 2002  . SHOULDER ARTHROSCOPY WITH OPEN ROTATOR CUFF REPAIR Left 06/17/2019   Procedure: SHOULDER ARTHROSCOPY WITH OPEN ROTATOR CUFF REPAIR, SUBACROMIAL DECOMPRESSION;  Surgeon: Jones Broomhandler, Justin, MD;  [facility];  Service: Orthopedics;  Laterality: Left;  . TRIGGER FINGER RELEASE Right 09/15/2017   Procedure: RELEASE TRIGGER FINGER/A-1 PULLEY RIGHT THUMB;  Surgeon: Cindee SaltKuzma, Gary, MD;  Location: [facility];  Service: Orthopedics;  Laterality: Right;  Bier block  . ULNAR NERVE TRANSPOSITION Right 09/20/2013   Procedure: RIGHT ULNAR NERVE DECOMPRESSION;  Surgeon: Nicki ReaperGary R Kuzma, MD;  Location: [facility];  Service: Orthopedics;  Laterality: Right;  . UPPER GASTROINTESTINAL ENDOSCOPY    . UPPER GI  ENDOSCOPY      There were no vitals filed for this visit.  Subjective Assessment - 08/20/19 1635    Subjective  Patient reports no pain in her shoulder. She still isntsleeping at night and she feels like she still can not do her hair. She is otherwise doing well.    Patient Stated Goals  Nothing post-op    Currently in Pain?  No/denies                       [facility]/Exercise - 08/21/19 0001      Shoulder Exercises: Supine   Protraction Limitations  with 3lb wand     Flexion Limitations  no wand 2x10 1lb       Shoulder Exercises: Sidelying   Other Sidelying Exercises  sidelying ER 2x10 1 lb       Shoulder Exercises: Standing   External Rotation  Left;15 reps    Theraband Level (Shoulder External Rotation)  Level 2 (Red)    Theraband Level (Shoulder Internal Rotation)  Level 2 (Red)    Internal Rotation Limitations  2x10    Extension  20 reps    Theraband Level (Shoulder Extension)  Level 3 (Green)    Row  20 reps    Theraband Level (Shoulder Row)  Level 3 (Green)    Other Standing Exercises  finger  ladder x4     Other Standing Exercises  standing bilateral shoulder flexion to 90 x10 forward and x10 scaption       Shoulder Exercises: Pulleys   Flexion Limitations  2 min     Scaption Limitations  2 min       Shoulder Exercises: Stretch   Other Shoulder Stretches  internal rotation stretch with mulligan manip 5x10 sec hold     Other Shoulder Stretches  star gazer stretch 5x15 sec hold       Manual Therapy   Manual Therapy  Passive ROM;Joint mobilization    Joint Mobilization  grade I and II PA and AP mobilizations to the right shoulder.     Passive ROM  Left shoulder PROM within MD protocol parameters             PT Education - 08/20/19 1636    Education Details  reviwed exercise technique    Person(s) Educated  Patient    Methods  Explanation;Verbal cues;Demonstration;Tactile cues    Comprehension  Verbalized understanding;Returned  demonstration       PT Short Term Goals - 08/15/19 1005      PT SHORT TERM GOAL #1   Title  Patient will increase passive ER to 30 degrees    Baseline  50    Time  4    Period  Weeks    Status  Achieved      PT SHORT TERM GOAL #2   Title  Patient will increase passive flexion to 100 degrees    Time  4    Period  Weeks    Status  Achieved      PT SHORT TERM GOAL #3   Title  Patiewnt will begin light AAROM per protocol    Baseline  has begun without difficulty    Period  Weeks    Status  Achieved        PT Long Term Goals - 07/01/19 1445      PT LONG TERM GOAL #1   Title  Patient will reach behind her head in order to do her hair    Time  8    Period  Weeks    Status  New    Target Date  08/26/19      PT LONG TERM GOAL #2   Title  Patient will reach behind her back to L3 in order to perfrom daily tasks    Time  8    Period  Weeks    Status  New      PT LONG TERM GOAL #3   Title  Patient will reach overhead to shelf to grab object without increased pain in order to perfrom ADL's    Time  8    Period  Weeks    Status  New    Target Date  08/26/19            Plan - 08/21/19 0900    Clinical Impression Statement  Patient is mking good progress. Her ER was alittle tight when she began but she was wquickly able to stretch it out. She began standing forward flexion in the mirror without any pain. She was encouraged to keep sttretching at home.    Comorbidities  lupis    Examination-Activity Limitations  Reach Overhead;Self Feeding;Carry    Stability/Clinical Decision Making  Evolving/Moderate complexity    Clinical Decision Making  Moderate    Rehab Potential  Good    PT Frequency  2x /  week    PT Duration  8 weeks    PT Treatment/Interventions  ADLs/Self Care Home Management;Electrical Stimulation;Iontophoresis 4mg /ml Dexamethasone;Moist Heat;Traction;Ultrasound;DME Instruction;Functional mobility training;Therapeutic activities;Therapeutic  exercise;Neuromuscular re-education;Patient/family education;Manual techniques;Passive range of motion;Taping;Scar mobilization    PT Next Visit Plan  continue with PROM until 5 weeks . Theodoro Grist has Dr Selina Cooley protocol    PT Home Exercise Plan  pendulum, scap retraction to neutral, wand ER and flexion; scap retraction and extension    Consulted and Agree with Plan of Care  Patient       Patient will benefit from skilled therapeutic intervention in order to improve the following deficits and impairments:  Decreased range of motion, Decreased activity tolerance, Decreased strength, Pain, Postural dysfunction, Impaired flexibility, Increased muscle spasms, Decreased mobility, Improper body mechanics, Impaired perceived functional ability  Visit Diagnosis: Stiffness of left shoulder, not elsewhere classified  Acute pain of left shoulder  Muscle weakness (generalized)     Problem List Patient Active Problem List   Diagnosis Date Noted  . Polyneuropathy 03/20/2019  . Numbness 03/20/2019  . Gait disturbance 03/20/2019  . Impingement syndrome of right shoulder 05/16/2017  . Special screening for malignant neoplasms, colon 11/28/2011  . Diverticulosis of colon (without mention of hemorrhage) 11/28/2011  . Abdominal pain 11/15/2011  . Polyp of gallbladder 11/15/2011    Dessie Coma PT DPT  08/21/2019, 10:34 AM  [facility] [address] [place], [place], [address] Phone: [phone]   Fax:  [phone]  Name: Julia Kim MRN: [id number] Date of Birth: 09/27/1956

## 2019-08-22 ENCOUNTER — Other Ambulatory Visit: Payer: Self-pay

## 2019-08-22 ENCOUNTER — Encounter: Payer: Self-pay | Admitting: Physical Therapy

## 2019-08-22 ENCOUNTER — Ambulatory Visit: Payer: 59 | Admitting: Physical Therapy

## 2019-08-22 DIAGNOSIS — M6281 Muscle weakness (generalized): Secondary | ICD-10-CM | POA: Diagnosis not present

## 2019-08-22 DIAGNOSIS — M542 Cervicalgia: Secondary | ICD-10-CM | POA: Diagnosis not present

## 2019-08-22 DIAGNOSIS — M25512 Pain in left shoulder: Secondary | ICD-10-CM | POA: Diagnosis not present

## 2019-08-22 DIAGNOSIS — M25612 Stiffness of left shoulder, not elsewhere classified: Secondary | ICD-10-CM | POA: Diagnosis not present

## 2019-08-22 NOTE — Therapy (Signed)
[facility] [address] [place], [place], [address] Phone: [phone]   Fax:  [phone]  Physical Therapy Treatment  Patient Details  Name: Julia Kim MRN: [id number] Date of Birth: 05/16/1956 Referring Provider (PT): Dr Jones BroomJustin Chandler    Encounter Date: 08/22/2019  PT End of Session - 08/22/19 1552    Visit Number  15    Number of Visits  16    Date for PT Re-Evaluation  08/26/19    Authorization Type  [facility]    PT Start Time  1545    PT Stop Time  1627    PT Time Calculation (min)  42 min    Activity Tolerance  Patient tolerated treatment well    Behavior During Therapy  [facility] for tasks assessed/performed       Past Medical History:  Diagnosis Date  . Allergic sinusitis   . 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 Full dentures   . GERD (gastroesophageal reflux disease)   . Glaucoma, both eyes   . Hiatal hernia   . Hyperlipidemia   . Hyperlipidemia   . Hypertension   . Partial tear of left rotator cuff   . Vitamin D deficiency   . Wears glasses     Past Surgical History:  Procedure Laterality Date  . CESAREAN SECTION  1975  . COLONOSCOPY    . FOOT SURGERY Right 2002  . SHOULDER ARTHROSCOPY WITH OPEN ROTATOR CUFF REPAIR Left 06/17/2019   Procedure: SHOULDER ARTHROSCOPY WITH OPEN ROTATOR CUFF REPAIR, SUBACROMIAL DECOMPRESSION;  Surgeon: Jones Broomhandler, Justin, MD;  [facility] ;  Service: Orthopedics;  Laterality: Left;  . TRIGGER FINGER RELEASE Right 09/15/2017   Procedure: RELEASE TRIGGER FINGER/A-1 PULLEY RIGHT THUMB;  Surgeon: Cindee SaltKuzma, Gary, MD;  Location: [facility];  Service: Orthopedics;  Laterality: Right;  Bier block  . ULNAR NERVE TRANSPOSITION Right 09/20/2013   Procedure: RIGHT ULNAR NERVE DECOMPRESSION;  Surgeon: Nicki ReaperGary R Kuzma, MD;  Location: [facility];  Service: Orthopedics;  Laterality: Right;  . UPPER GASTROINTESTINAL ENDOSCOPY    . UPPER GI  ENDOSCOPY      There were no vitals filed for this visit.  Subjective Assessment - 08/22/19 1548    Subjective  Patient reports her shoulder was sore after the last visit but overall she is doing well. she has no pain today.    Patient Stated Goals  Nothing post-op    Currently in Pain?  No/denies         [facility] PT Assessment - 08/22/19 0001      PROM   Left Shoulder Flexion  135 Degrees    Left Shoulder Internal Rotation  65 Degrees    Left Shoulder External Rotation  50 Degrees      Palpation   Palpation comment  no unexpected tenderness to palaption                    [facility] Adult PT Treatment/Exercise - 08/22/19 0001      Shoulder Exercises: Supine   Protraction Limitations  with 3lb wand     Flexion Limitations  no wand 2x10 1lb       Shoulder Exercises: Sidelying   Other Sidelying Exercises  sidelying ER 2x10 1 lb       Shoulder Exercises: Standing   External Rotation  Left;15 reps    Theraband Level (Shoulder External Rotation)  Level 2 (Red)    Theraband Level (Shoulder Internal Rotation)  Level  2 (Red)    Internal Rotation Limitations  2x10    Extension  20 reps    Theraband Level (Shoulder Extension)  Level 3 (Green)    Row  20 reps    Theraband Level (Shoulder Row)  Level 3 (Green)    Other Standing Exercises  finger ladder x4     Other Standing Exercises  standing bilateral shoulder flexion to 90 x10 forward and x10 scaption       Shoulder Exercises: Pulleys   Flexion Limitations  2 min     Scaption Limitations  2 min       Shoulder Exercises: Stretch   Other Shoulder Stretches  internal rotation stretch with mulligan manip 5x10 sec hold     Other Shoulder Stretches  star gazer stretch 5x15 sec hold       Manual Therapy   Manual Therapy  Passive ROM;Joint mobilization    Joint Mobilization  grade I and II PA and AP mobilizations to the right shoulder.     Passive ROM  Left shoulder PROM within MD protocol parameters              PT Education - 08/22/19 1551    Education Details  symptom mangement and HEP    Person(s) Educated  Patient    Methods  Explanation;Demonstration;Tactile cues;Verbal cues    Comprehension  Verbalized understanding;Returned demonstration;Verbal cues required;Tactile cues required       PT Short Term Goals - 08/15/19 1005      PT SHORT TERM GOAL #1   Title  Patient will increase passive ER to 30 degrees    Baseline  50    Time  4    Period  Weeks    Status  Achieved      PT SHORT TERM GOAL #2   Title  Patient will increase passive flexion to 100 degrees    Time  4    Period  Weeks    Status  Achieved      PT SHORT TERM GOAL #3   Title  Patiewnt will begin light AAROM per protocol    Baseline  has begun without difficulty    Period  Weeks    Status  Achieved        PT Long Term Goals - 07/01/19 1445      PT LONG TERM GOAL #1   Title  Patient will reach behind her head in order to do her hair    Time  8    Period  Weeks    Status  New    Target Date  08/26/19      PT LONG TERM GOAL #2   Title  Patient will reach behind her back to L3 in order to perfrom daily tasks    Time  8    Period  Weeks    Status  New      PT LONG TERM GOAL #3   Title  Patient will reach overhead to shelf to grab object without increased pain in order to perfrom ADL's    Time  8    Period  Weeks    Status  New    Target Date  08/26/19            Plan - 08/22/19 1611    Clinical Impression Statement  Patient continues to make good progress. She has a little difficulty with ER exercises but she is otherwise tolerating exercises well. Therapy will continue to progress patient as tolerated.  She is lacking some passiveER as well.    Personal Factors and Comorbidities  Comorbidity 1    Comorbidities  lupis    Examination-Activity Limitations  Reach Overhead;Self Feeding;Carry    Examination-Participation Restrictions  Cleaning;Personal Finances    Stability/Clinical  Decision Making  Evolving/Moderate complexity    Clinical Decision Making  Moderate    Rehab Potential  Good    PT Frequency  2x / week    PT Duration  8 weeks    PT Treatment/Interventions  ADLs/Self Care Home Management;Electrical Stimulation;Iontophoresis 4mg /ml Dexamethasone;Moist Heat;Traction;Ultrasound;DME Instruction;Functional mobility training;Therapeutic activities;Therapeutic exercise;Neuromuscular re-education;Patient/family education;Manual techniques;Passive range of motion;Taping;Scar mobilization    PT Next Visit Plan  continue with PROM until 5 weeks . Waunita Schooner has Dr Rodman Key protocol    PT Home Exercise Plan  pendulum, scap retraction to neutral, wand ER and flexion; scap retraction and extension    Consulted and Agree with Plan of Care  Patient       Patient will benefit from skilled therapeutic intervention in order to improve the following deficits and impairments:  Decreased range of motion, Decreased activity tolerance, Decreased strength, Pain, Postural dysfunction, Impaired flexibility, Increased muscle spasms, Decreased mobility, Improper body mechanics, Impaired perceived functional ability  Visit Diagnosis: Stiffness of left shoulder, not elsewhere classified  Acute pain of left shoulder  Muscle weakness (generalized)  Cervicalgia     Problem List Patient Active Problem List   Diagnosis Date Noted  . Polyneuropathy 03/20/2019  . Numbness 03/20/2019  . Gait disturbance 03/20/2019  . Impingement syndrome of right shoulder 05/16/2017  . Special screening for malignant neoplasms, colon 11/28/2011  . Diverticulosis of colon (without mention of hemorrhage) 11/28/2011  . Abdominal pain 11/15/2011  . Polyp of gallbladder 11/15/2011    Carney Living PT DPT  08/22/2019, 4:28 PM  [facility] [address] [place], [place], [address] Phone: [phone]   Fax:  [phone]  Name: Julia Kim MRN:  [id number] Date of Birth: [birth year]

## 2019-08-28 ENCOUNTER — Ambulatory Visit: Payer: 59 | Admitting: Physical Therapy

## 2019-08-28 ENCOUNTER — Other Ambulatory Visit: Payer: Self-pay

## 2019-08-28 ENCOUNTER — Encounter: Payer: Self-pay | Admitting: Physical Therapy

## 2019-08-28 DIAGNOSIS — M6281 Muscle weakness (generalized): Secondary | ICD-10-CM

## 2019-08-28 DIAGNOSIS — M25512 Pain in left shoulder: Secondary | ICD-10-CM | POA: Diagnosis not present

## 2019-08-28 DIAGNOSIS — M25612 Stiffness of left shoulder, not elsewhere classified: Secondary | ICD-10-CM | POA: Diagnosis not present

## 2019-08-28 DIAGNOSIS — M542 Cervicalgia: Secondary | ICD-10-CM | POA: Diagnosis not present

## 2019-08-29 ENCOUNTER — Ambulatory Visit: Payer: 59 | Admitting: Physical Therapy

## 2019-08-29 ENCOUNTER — Encounter: Payer: Self-pay | Admitting: Physical Therapy

## 2019-08-29 DIAGNOSIS — M25612 Stiffness of left shoulder, not elsewhere classified: Secondary | ICD-10-CM | POA: Diagnosis not present

## 2019-08-29 DIAGNOSIS — M25512 Pain in left shoulder: Secondary | ICD-10-CM | POA: Diagnosis not present

## 2019-08-29 DIAGNOSIS — M6281 Muscle weakness (generalized): Secondary | ICD-10-CM | POA: Diagnosis not present

## 2019-08-29 DIAGNOSIS — M542 Cervicalgia: Secondary | ICD-10-CM | POA: Diagnosis not present

## 2019-08-29 NOTE — Therapy (Signed)
[facility] [address] [place], [place], [address] Phone: [phone]   Fax:  [phone]  Physical Therapy Treatment  Patient Details  Name: Julia Kim MRN: [id number] Date of Birth: Mar 01, 1956 Referring Provider (PT): Dr Jones Broom    Encounter Date: 08/28/2019  PT End of Session - 08/28/19 1637    Visit Number  16    Number of Visits  24    Date for PT Re-Evaluation  09/25/19    Authorization Type  [facility] [employer]    PT Start Time  1633    PT Stop Time  1712    PT Time Calculation (min)  39 min    Activity Tolerance  Patient tolerated treatment well    Behavior During Therapy  [facility] for tasks assessed/performed       Past Medical History:  Diagnosis Date  . Allergic sinusitis   . 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 Full dentures   . GERD (gastroesophageal reflux disease)   . Glaucoma, both eyes   . Hiatal hernia   . Hyperlipidemia   . Hyperlipidemia   . Hypertension   . Partial tear of left rotator cuff   . Vitamin D deficiency   . Wears glasses     Past Surgical History:  Procedure Laterality Date  . CESAREAN SECTION  1975  . COLONOSCOPY    . FOOT SURGERY Right 2002  . SHOULDER ARTHROSCOPY WITH OPEN ROTATOR CUFF REPAIR Left 06/17/2019   Procedure: SHOULDER ARTHROSCOPY WITH OPEN ROTATOR CUFF REPAIR, SUBACROMIAL DECOMPRESSION;  Surgeon: Jones Broom, MD;  Location: [facility];  Service: Orthopedics;  Laterality: Left;  . TRIGGER FINGER RELEASE Right 09/15/2017   Procedure: RELEASE TRIGGER FINGER/A-1 PULLEY RIGHT THUMB;  Surgeon: Cindee Salt, MD;  Location: [facility];  Service: Orthopedics;  Laterality: Right;  Bier block  . ULNAR NERVE TRANSPOSITION Right 09/20/2013   Procedure: RIGHT ULNAR NERVE DECOMPRESSION;  Surgeon: Nicki Reaper, MD;  Location: [facility];  Service: Orthopedics;  Laterality: Right;  . UPPER GASTROINTESTINAL ENDOSCOPY    . UPPER GI  ENDOSCOPY      There were no vitals filed for this visit.  Subjective Assessment - 08/28/19 1636    Subjective  Patient has no complaints. she is back to working from home. She has no pain.    Patient Stated Goals  Nothing post-op    Currently in Pain?  No/denies                       [facility] Adult PT Treatment/Exercise - 08/29/19 0001      Shoulder Exercises: Supine   Protraction Limitations  with 3lb wand     Flexion Limitations  no wand 2x10 1lb     Other Supine Exercises  wand 3 lb weight       Shoulder Exercises: Sidelying   Other Sidelying Exercises  sidelying ER 2x10 1 lb       Shoulder Exercises: Standing   External Rotation  Left;15 reps    Theraband Level (Shoulder External Rotation)  Level 2 (Red)    Theraband Level (Shoulder Internal Rotation)  Level 2 (Red)    Internal Rotation Limitations  2x10    Extension  20 reps    Theraband Level (Shoulder Extension)  Level 3 (Green)    Row  20 reps    Theraband Level (Shoulder Row)  Level 3 (Green)    Other Standing Exercises  finger ladder  x4     Other Standing Exercises  standing bilateral shoulder flexion to 90 x10 forward and x10 scaption       Shoulder Exercises: Pulleys   Flexion Limitations  2 min     Scaption Limitations  2 min       Shoulder Exercises: Stretch   Other Shoulder Stretches  internal rotation stretch with mulligan manip 5x10 sec hold     Other Shoulder Stretches  star gazer stretch 5x15 sec hold       Manual Therapy   Manual Therapy  Passive ROM;Joint mobilization    Joint Mobilization  grade I and II PA and AP mobilizations to the right shoulder.     Passive ROM  Left shoulder PROM within MD protocol parameters             PT Education - 08/28/19 1637    Education Details  reviewed symptom mangement and activity progression    Person(s) Educated  Patient    Methods  Explanation;Demonstration;Tactile cues;Verbal cues    Comprehension  Verbalized understanding;Returned  demonstration;Verbal cues required;Tactile cues required       PT Short Term Goals - 08/15/19 1005      PT SHORT TERM GOAL #1   Title  Patient will increase passive ER to 30 degrees    Baseline  50    Time  4    Period  Weeks    Status  Achieved      PT SHORT TERM GOAL #2   Title  Patient will increase passive flexion to 100 degrees    Time  4    Period  Weeks    Status  Achieved      PT SHORT TERM GOAL #3   Title  Patiewnt will begin light AAROM per protocol    Baseline  has begun without difficulty    Period  Weeks    Status  Achieved        PT Long Term Goals - 07/01/19 1445      PT LONG TERM GOAL #1   Title  Patient will reach behind her head in order to do her hair    Time  8    Period  Weeks    Status  New    Target Date  08/26/19      PT LONG TERM GOAL #2   Title  Patient will reach behind her back to L3 in order to perfrom daily tasks    Time  8    Period  Weeks    Status  New      PT LONG TERM GOAL #3   Title  Patient will reach overhead to shelf to grab object without increased pain in order to perfrom ADL's    Time  8    Period  Weeks    Status  New    Target Date  08/26/19            Plan - 08/29/19 1141    Clinical Impression Statement  Patient is making great progress. She was able to lift a 21lb weight without pain. She is nearing all goals for therapy. Anticipate D/C in the next two weeks. We will continue to work on IR stretching. She had significant imporovement with Mulligan Mobilization.    Personal Factors and Comorbidities  Comorbidity 1    Comorbidities  lupis    Examination-Activity Limitations  Reach Overhead;Self Feeding;Carry    Examination-Participation Restrictions  Cleaning;Personal Finances    Stability/Clinical Decision  Making  Evolving/Moderate complexity    Clinical Decision Making  Moderate    Rehab Potential  Good    PT Frequency  2x / week    PT Duration  8 weeks    PT Treatment/Interventions  ADLs/Self Care Home  Management;Electrical Stimulation;Iontophoresis 4mg /ml Dexamethasone;Moist Heat;Traction;Ultrasound;DME Instruction;Functional mobility training;Therapeutic activities;Therapeutic exercise;Neuromuscular re-education;Patient/family education;Manual techniques;Passive range of motion;Taping;Scar mobilization    PT Next Visit Plan  continue to progres functional strengthening    PT Home Exercise Plan  pendulum, scap retraction to neutral, wand ER and flexion; scap retraction and extension    Consulted and Agree with Plan of Care  Patient       Patient will benefit from skilled therapeutic intervention in order to improve the following deficits and impairments:  Decreased range of motion, Decreased activity tolerance, Decreased strength, Pain, Postural dysfunction, Impaired flexibility, Increased muscle spasms, Decreased mobility, Improper body mechanics, Impaired perceived functional ability  Visit Diagnosis: Stiffness of left shoulder, not elsewhere classified  Acute pain of left shoulder  Muscle weakness (generalized)     Problem List Patient Active Problem List   Diagnosis Date Noted  . Polyneuropathy 03/20/2019  . Numbness 03/20/2019  . Gait disturbance 03/20/2019  . Impingement syndrome of right shoulder 05/16/2017  . Special screening for malignant neoplasms, colon 11/28/2011  . Diverticulosis of colon (without mention of hemorrhage) 11/28/2011  . Abdominal pain 11/15/2011  . Polyp of gallbladder 11/15/2011    Carney Living PT DPT  08/29/2019, 11:45 AM  [facility] [address] [place], [place], [address] Phone: [phone]   Fax:  [phone]  Name: JERNEE MURTAUGH MRN: [id number] Date of Birth: 09/08/1956

## 2019-08-29 NOTE — Therapy (Signed)
[facility] [address] [place], [place], [address] Phone: [phone]   Fax:  [phone]  Physical Therapy Treatment  Patient Details  Name: AHNNA TREVETT MRN: [id number] Date of Birth: 1956/04/15 Referring Provider (PT): Dr Jones Broom    Encounter Date: 08/29/2019  PT End of Session - 08/29/19 1632    Visit Number  17    Number of Visits  24    Date for PT Re-Evaluation  09/25/19    Authorization Type  [facility]    PT Start Time  1625    PT Stop Time  1705    PT Time Calculation (min)  40 min    Activity Tolerance  Patient tolerated treatment well    Behavior During Therapy  [facility] for tasks assessed/performed       Past Medical History:  Diagnosis Date  . Allergic sinusitis   . 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 Full dentures   . GERD (gastroesophageal reflux disease)   . Glaucoma, both eyes   . Hiatal hernia   . Hyperlipidemia   . Hyperlipidemia   . Hypertension   . Partial tear of left rotator cuff   . Vitamin D deficiency   . Wears glasses     Past Surgical History:  Procedure Laterality Date  . CESAREAN SECTION  1975  . COLONOSCOPY    . FOOT SURGERY Right 2002  . SHOULDER ARTHROSCOPY WITH OPEN ROTATOR CUFF REPAIR Left 06/17/2019   Procedure: SHOULDER ARTHROSCOPY WITH OPEN ROTATOR CUFF REPAIR, SUBACROMIAL DECOMPRESSION;  Surgeon: Jones Broom, MD;  Location: [facility];  Service: Orthopedics;  Laterality: Left;  . TRIGGER FINGER RELEASE Right 09/15/2017   Procedure: RELEASE TRIGGER FINGER/A-1 PULLEY RIGHT THUMB;  Surgeon: Cindee Salt, MD;  Location: [facility];  Service: Orthopedics;  Laterality: Right;  Bier block  . ULNAR NERVE TRANSPOSITION Right 09/20/2013   Procedure: RIGHT ULNAR NERVE DECOMPRESSION;  Surgeon: Nicki Reaper, MD;  Location: [facility];  Service: Orthopedics;  Laterality: Right;  . UPPER GASTROINTESTINAL ENDOSCOPY    . UPPER GI  ENDOSCOPY      There were no vitals filed for this visit.  Subjective Assessment - 08/29/19 1630    Subjective  Patient reports she is a little sore in her bicep today. She woke up a little sore and it got sore throughout the day.    Currently in Pain?  Yes    Pain Location  Shoulder    Pain Orientation  Left    Pain Descriptors / Indicators  Aching    Pain Type  Chronic pain    Pain Onset  More than a month ago    Pain Frequency  Constant    Aggravating Factors   forward flexion    Pain Relieving Factors  rest    Multiple Pain Sites  No                       [facility] Adult PT Treatment/Exercise - 08/29/19 1705      Shoulder Exercises: Supine   Protraction Limitations  with 2lb wand     Flexion Limitations  no wand 2x10 1lb     Other Supine Exercises  wand flexion 2lb weight       Shoulder Exercises: Sidelying   Other Sidelying Exercises  sidelying ER 2x10 1 lb       Shoulder Exercises: Standing   External Rotation  Left;15 reps  Theraband Level (Shoulder External Rotation)  Level 2 (Red)    Internal Rotation  20 reps    Theraband Level (Shoulder Internal Rotation)  Level 2 (Red)    Internal Rotation Limitations  2x10    Extension  20 reps    Theraband Level (Shoulder Extension)  Level 3 (Green)    Row  20 reps    Theraband Level (Shoulder Row)  Level 3 (Green)    Other Standing Exercises  finger ladder x4     Other Standing Exercises  shoulder flexion to cabinet without weight       Shoulder Exercises: Pulleys   Flexion Limitations  2 min     Scaption Limitations  2 min       Shoulder Exercises: Stretch   Other Shoulder Stretches  IR held 2nd to pain       Manual Therapy   Manual Therapy  Passive ROM;Joint mobilization    Joint Mobilization  grade I and II PA and AP mobilizations to the right shoulder.     Passive ROM  Left shoulder PROM within MD protocol parameters             PT Education - 08/29/19 1631    Education Details  reviewed  how to decrease inflammation    Person(s) Educated  Patient    Methods  Explanation;Demonstration;Tactile cues;Verbal cues    Comprehension  Verbalized understanding;Verbal cues required;Tactile cues required;Returned demonstration       PT Short Term Goals - 08/15/19 1005      PT SHORT TERM GOAL #1   Title  Patient will increase passive ER to 30 degrees    Baseline  50    Time  4    Period  Weeks    Status  Achieved      PT SHORT TERM GOAL #2   Title  Patient will increase passive flexion to 100 degrees    Time  4    Period  Weeks    Status  Achieved      PT SHORT TERM GOAL #3   Title  Patiewnt will begin light AAROM per protocol    Baseline  has begun without difficulty    Period  Weeks    Status  Achieved        PT Long Term Goals - 07/01/19 1445      PT LONG TERM GOAL #1   Title  Patient will reach behind her head in order to do her hair    Time  8    Period  Weeks    Status  New    Target Date  08/26/19      PT LONG TERM GOAL #2   Title  Patient will reach behind her back to L3 in order to perfrom daily tasks    Time  8    Period  Weeks    Status  New      PT LONG TERM GOAL #3   Title  Patient will reach overhead to shelf to grab object without increased pain in order to perfrom ADL's    Time  8    Period  Weeks    Status  New    Target Date  08/26/19            Plan - 08/29/19 1647    Clinical Impression Statement  Patient continues to make great porogress. She was scaled back slightly today becasue of her soreness. She had no increase pain with exercises. She  was advised to ice if needed otherwise continuew with her exercises over the weekend.    Personal Factors and Comorbidities  Comorbidity 1    Comorbidities  lupis    Examination-Activity Limitations  Reach Overhead;Self Feeding;Carry    Examination-Participation Restrictions  Cleaning;Personal Finances    Stability/Clinical Decision Making  Evolving/Moderate complexity    Clinical  Decision Making  Moderate    Rehab Potential  Good    PT Frequency  2x / week    PT Duration  8 weeks    PT Treatment/Interventions  ADLs/Self Care Home Management;Electrical Stimulation;Iontophoresis 4mg /ml Dexamethasone;Moist Heat;Traction;Ultrasound;DME Instruction;Functional mobility training;Therapeutic activities;Therapeutic exercise;Neuromuscular re-education;Patient/family education;Manual techniques;Passive range of motion;Taping;Scar mobilization    PT Next Visit Plan  continue to progres functional strengthening    PT Home Exercise Plan  pendulum, scap retraction to neutral, wand ER and flexion; scap retraction and extension    Consulted and Agree with Plan of Care  Patient       Patient will benefit from skilled therapeutic intervention in order to improve the following deficits and impairments:  Decreased range of motion, Decreased activity tolerance, Decreased strength, Pain, Postural dysfunction, Impaired flexibility, Increased muscle spasms, Decreased mobility, Improper body mechanics, Impaired perceived functional ability  Visit Diagnosis: Stiffness of left shoulder, not elsewhere classified  Acute pain of left shoulder  Muscle weakness (generalized)     Problem List Patient Active Problem List   Diagnosis Date Noted  . Polyneuropathy 03/20/2019  . Numbness 03/20/2019  . Gait disturbance 03/20/2019  . Impingement syndrome of right shoulder 05/16/2017  . Special screening for malignant neoplasms, colon 11/28/2011  . Diverticulosis of colon (without mention of hemorrhage) 11/28/2011  . Abdominal pain 11/15/2011  . Polyp of gallbladder 11/15/2011    Dessie Comaavid J Vitoria Conyer PT DPT  08/29/2019, 5:07 PM  [facility] [address] [place], [place], [address] Phone: [phone]   Fax:  [phone]  Name: [place] RopesCheryl A Mendiola MRN: [id number] Date of Birth: 01/10/1956

## 2019-09-03 ENCOUNTER — Other Ambulatory Visit: Payer: Self-pay

## 2019-09-03 ENCOUNTER — Ambulatory Visit: Payer: 59 | Admitting: Physical Therapy

## 2019-09-03 ENCOUNTER — Encounter: Payer: Self-pay | Admitting: Physical Therapy

## 2019-09-03 DIAGNOSIS — M6281 Muscle weakness (generalized): Secondary | ICD-10-CM

## 2019-09-03 DIAGNOSIS — M25512 Pain in left shoulder: Secondary | ICD-10-CM

## 2019-09-03 DIAGNOSIS — M542 Cervicalgia: Secondary | ICD-10-CM | POA: Diagnosis not present

## 2019-09-03 DIAGNOSIS — M25612 Stiffness of left shoulder, not elsewhere classified: Secondary | ICD-10-CM

## 2019-09-04 ENCOUNTER — Encounter: Payer: Self-pay | Admitting: Physical Therapy

## 2019-09-04 NOTE — Therapy (Signed)
[place], [place], [address] Phone: [phone]   Fax:  [phone]  Physical Therapy Treatment  Patient Details  Name: Julia Kim MRN: [id number] Date of Birth: 1956/01/22 Referring Provider (PT): Dr Tania Ade    Encounter Date: 09/03/2019  PT End of Session - 09/03/19 1554    Visit Number  18    Number of Visits  24    Date for PT Re-Evaluation  09/25/19    Authorization Type  [facility]    PT Start Time  1548    PT Stop Time  1626    PT Time Calculation (min)  38 min    Activity Tolerance  Patient tolerated treatment well    Behavior During Therapy  [facility] for tasks assessed/performed       Past Medical History:  Diagnosis Date  . Allergic sinusitis   . 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 Full dentures   . GERD (gastroesophageal reflux disease)   . Glaucoma, both eyes   . Hiatal hernia   . Hyperlipidemia   . Hyperlipidemia   . Hypertension   . Partial tear of left rotator cuff   . Vitamin D deficiency   . Wears glasses     Past Surgical History:  Procedure Laterality Date  . CESAREAN SECTION  1975  . COLONOSCOPY    . FOOT SURGERY Right 2002  . SHOULDER ARTHROSCOPY WITH OPEN ROTATOR CUFF REPAIR Left 06/17/2019   Procedure: SHOULDER ARTHROSCOPY WITH OPEN ROTATOR CUFF REPAIR, SUBACROMIAL DECOMPRESSION;  Surgeon: Tania Ade, MD;  Location: [place];  Service: Orthopedics;  Laterality: Left;  . TRIGGER FINGER RELEASE Right 09/15/2017   Procedure: RELEASE TRIGGER FINGER/A-1 PULLEY RIGHT THUMB;  Surgeon: Daryll Brod, MD;  Location: [place];  Service: Orthopedics;  Laterality: Right;  Bier block  . ULNAR NERVE TRANSPOSITION Right 09/20/2013   Procedure: RIGHT ULNAR NERVE DECOMPRESSION;  Surgeon: Wynonia Sours, MD;  Location: [place];  Service: Orthopedics;  Laterality: Right;  . UPPER GASTROINTESTINAL ENDOSCOPY    . UPPER GI  ENDOSCOPY      There were no vitals filed for this visit.  Subjective Assessment - 09/04/19 0839    Subjective  Patient reports sitffness but no pain. She had no pain after the last visit.    Patient Stated Goals  Nothing post-op    Currently in Pain?  No/denies                       [facility] Adult PT Treatment/Exercise - 09/04/19 0001      Shoulder Exercises: Supine   Protraction Limitations  with 2lb wand     Other Supine Exercises  wand flexion 3lb weight       Shoulder Exercises: Sidelying   Other Sidelying Exercises  sidelying ER 2x10 1 lb       Shoulder Exercises: Standing   External Rotation  Left;15 reps    Theraband Level (Shoulder External Rotation)  Level 2 (Red)    Internal Rotation  20 reps    Theraband Level (Shoulder Internal Rotation)  Level 3 (Green)    Internal Rotation Limitations  2x10    Extension  20 reps    Theraband Level (Shoulder Extension)  Level 4 (Blue)    Row  20 reps    Theraband Level (Shoulder Row)  Level 4 (Blue)      Shoulder Exercises: Pulleys   Flexion Limitations  2 min     Scaption Limitations  2 min       Manual Therapy   Manual Therapy  Passive ROM;Joint mobilization    Joint Mobilization  grade I and II PA and AP mobilizations to the right shoulder.     Passive ROM  Left shoulder PROM within MD protocol parameters             PT Education - 09/03/19 1553    Education Details  technique with ther-ex.    Person(s) Educated  Patient    Methods  Explanation;Demonstration    Comprehension  Verbalized understanding;Returned demonstration;Verbal cues required;Tactile cues required       PT Short Term Goals - 08/15/19 1005      PT SHORT TERM GOAL #1   Title  Patient will increase passive ER to 30 degrees    Baseline  50    Time  4    Period  Weeks    Status  Achieved      PT SHORT TERM GOAL #2   Title  Patient will increase passive flexion to 100 degrees    Time  4    Period  Weeks    Status  Achieved       PT SHORT TERM GOAL #3   Title  Patiewnt will begin light AAROM per protocol    Baseline  has begun without difficulty    Period  Weeks    Status  Achieved        PT Long Term Goals - 07/01/19 1445      PT LONG TERM GOAL #1   Title  Patient will reach behind her head in order to do her hair    Time  8    Period  Weeks    Status  New    Target Date  08/26/19      PT LONG TERM GOAL #2   Title  Patient will reach behind her back to L3 in order to perfrom daily tasks    Time  8    Period  Weeks    Status  New      PT LONG TERM GOAL #3   Title  Patient will reach overhead to shelf to grab object without increased pain in order to perfrom ADL's    Time  8    Period  Weeks    Status  New    Target Date  08/26/19            Plan - 09/04/19 0840    Clinical Impression Statement  Patient was slightly limited in ER today. Her motion improved with PROM. She was encouraged to continue at home. Her strength continues to improve. Therapy was able to advance her resistance today. Sh econtinues to make great progress.    Personal Factors and Comorbidities  Comorbidity 1    Comorbidities  lupis    Examination-Activity Limitations  Reach Overhead;Self Feeding;Carry    Examination-Participation Restrictions  Cleaning;Personal Finances    Stability/Clinical Decision Making  Evolving/Moderate complexity    Clinical Decision Making  Moderate    Rehab Potential  Good    PT Frequency  2x / week    PT Duration  8 weeks    PT Treatment/Interventions  ADLs/Self Care Home Management;Electrical Stimulation;Iontophoresis 4mg /ml Dexamethasone;Moist Heat;Traction;Ultrasound;DME Instruction;Functional mobility training;Therapeutic activities;Therapeutic exercise;Neuromuscular re-education;Patient/family education;Manual techniques;Passive range of motion;Taping;Scar mobilization    PT Next Visit Plan  continue to progres functional strengthening    PT Home Exercise Plan  pendulum, scap  retraction to neutral, wand ER and flexion; scap retraction and extension    Consulted and Agree with Plan of Care  Patient       Patient will benefit from skilled therapeutic intervention in order to improve the following deficits and impairments:  Decreased range of motion, Decreased activity tolerance, Decreased strength, Pain, Postural dysfunction, Impaired flexibility, Increased muscle spasms, Decreased mobility, Improper body mechanics, Impaired perceived functional ability  Visit Diagnosis: Stiffness of left shoulder, not elsewhere classified  Acute pain of left shoulder  Muscle weakness (generalized)     Problem List Patient Active Problem List   Diagnosis Date Noted  . Polyneuropathy 03/20/2019  . Numbness 03/20/2019  . Gait disturbance 03/20/2019  . Impingement syndrome of right shoulder 05/16/2017  . Special screening for malignant neoplasms, colon 11/28/2011  . Diverticulosis of colon (without mention of hemorrhage) 11/28/2011  . Abdominal pain 11/15/2011  . Polyp of gallbladder 11/15/2011    Dessie Comaavid J Clema Skousen PT DPT  09/04/2019, 9:15 AM  [facility] [address] [place], [place], [address] Phone: [phone]   Fax:  [phone]  Name: Julia RopesCheryl A Kim MRN: [id number] Date of Birth: 10/25/1956

## 2019-09-05 ENCOUNTER — Encounter: Payer: Self-pay | Admitting: Physical Therapy

## 2019-09-05 ENCOUNTER — Other Ambulatory Visit: Payer: Self-pay

## 2019-09-05 ENCOUNTER — Ambulatory Visit: Payer: 59 | Admitting: Physical Therapy

## 2019-09-05 DIAGNOSIS — M25512 Pain in left shoulder: Secondary | ICD-10-CM

## 2019-09-05 DIAGNOSIS — M6281 Muscle weakness (generalized): Secondary | ICD-10-CM

## 2019-09-05 DIAGNOSIS — M25612 Stiffness of left shoulder, not elsewhere classified: Secondary | ICD-10-CM

## 2019-09-05 DIAGNOSIS — M542 Cervicalgia: Secondary | ICD-10-CM | POA: Diagnosis not present

## 2019-09-06 ENCOUNTER — Encounter: Payer: Self-pay | Admitting: Physical Therapy

## 2019-09-06 NOTE — Therapy (Signed)
[facility] [address] [place], [place], [address] Phone: [phone]   Fax:  [phone]  Physical Therapy Treatment  Patient Details  Name: Julia Kim MRN: [id number] Date of Birth: 11/25/1956 Referring Provider (PT): Dr Jones BroomJustin Chandler    Encounter Date: 09/05/2019  PT End of Session - 09/05/19 1652    Visit Number  19    Number of Visits  24    Date for PT Re-Evaluation  09/25/19    Authorization Type  [facility]    PT Start Time  1635    PT Stop Time  1715    PT Time Calculation (min)  40 min    Activity Tolerance  Patient tolerated treatment well    Behavior During Therapy  [facility] for tasks assessed/performed       Past Medical History:  Diagnosis Date  . Allergic sinusitis   . 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 Full dentures   . GERD (gastroesophageal reflux disease)   . Glaucoma, both eyes   . Hiatal hernia   . Hyperlipidemia   . Hyperlipidemia   . Hypertension   . Partial tear of left rotator cuff   . Vitamin D deficiency   . Wears glasses     Past Surgical History:  Procedure Laterality Date  . CESAREAN SECTION  1975  . COLONOSCOPY    . FOOT SURGERY Right 2002  . SHOULDER ARTHROSCOPY WITH OPEN ROTATOR CUFF REPAIR Left 06/17/2019   Procedure: SHOULDER ARTHROSCOPY WITH OPEN ROTATOR CUFF REPAIR, SUBACROMIAL DECOMPRESSION;  Surgeon: Jones Broomhandler, Justin, MD;  [facility];  Service: Orthopedics;  Laterality: Left;  . TRIGGER FINGER RELEASE Right 09/15/2017   Procedure: RELEASE TRIGGER FINGER/A-1 PULLEY RIGHT THUMB;  Surgeon: Cindee SaltKuzma, Gary, MD;  Location: [facility];  Service: Orthopedics;  Laterality: Right;  Bier block  . ULNAR NERVE TRANSPOSITION Right 09/20/2013   Procedure: RIGHT ULNAR NERVE DECOMPRESSION;  Surgeon: Nicki ReaperGary R Kuzma, MD;  Location: [facility];  Service: Orthopedics;  Laterality: Right;  . UPPER GASTROINTESTINAL ENDOSCOPY    . UPPER GI  ENDOSCOPY      There were no vitals filed for this visit.  Subjective Assessment - 09/05/19 1651    Subjective  Patient has no complaints. She has been stretching her shoulder out today and feels like it is doing well.    Patient Stated Goals  Nothing post-op    Currently in Pain?  No/denies                       [facility] - 09/06/19 0001      Shoulder Exercises: Supine   Protraction Limitations  with 3lb wand       Shoulder Exercises: Standing   External Rotation  Left;15 reps    Theraband Level (Shoulder External Rotation)  Level 2 (Red)    Theraband Level (Shoulder Internal Rotation)  Level 4 (Blue)    Extension  20 reps    Theraband Level (Shoulder Extension)  Level 4 (Blue)    Row  20 reps    Theraband Level (Shoulder Row)  Level 4 (Blue)    Other Standing Exercises  finger ladder x4 ; standing shoulder flexion in mirro 2lb 2x10     Other Standing Exercises  shoulder flexion to cabinet without weight       Shoulder Exercises: Pulleys   Flexion Limitations  2 min     Scaption Limitations  2  min       Manual Therapy   Manual Therapy  Passive ROM;Joint mobilization    Manual therapy comments  less PROM required today 2nd to improved motion     Joint Mobilization  grade I and II PA and AP mobilizations to the right shoulder.     Passive ROM  Left shoulder PROM within MD protocol parameters             PT Education - 09/05/19 1652    Education Details  reviewed ther-ex    Person(s) Educated  Patient    Methods  Explanation;Demonstration;Tactile cues;Verbal cues    Comprehension  Verbalized understanding;Returned demonstration;Verbal cues required;Tactile cues required       PT Short Term Goals - 09/06/19 0839      PT SHORT TERM GOAL #1   Title  Patient will increase passive ER to 30 degrees    Baseline  50    Period  Weeks    Status  Achieved      PT SHORT TERM GOAL #2   Title  Patient will increase passive flexion  to 100 degrees    Baseline  full    Time  4    Period  Weeks    Status  Achieved      PT SHORT TERM GOAL #3   Title  Patiewnt will begin light AAROM per protocol    Baseline  has begun without difficulty    Time  4    Period  Weeks    Status  Achieved        PT Long Term Goals - 09/06/19 9758      PT LONG TERM GOAL #1   Title  Patient will reach behind her head in order to do her hair    Baseline  verbalized independence    Time  8    Period  Weeks    Status  On-going      PT LONG TERM GOAL #2   Title  Patient will reach behind her back to L3 in order to perfrom daily tasks    Baseline  able without pain    Time  8    Period  Weeks    Status  On-going            Plan - 09/05/19 1653    Clinical Impression Statement  Patient continues to make great progress. She had full ROM today without pain. She tolerated exercises well. She is approaching D/C    Personal Factors and Comorbidities  Comorbidity 1    Comorbidities  lupis    Examination-Activity Limitations  Reach Overhead;Self Feeding;Carry    Examination-Participation Restrictions  Cleaning;Personal Finances    Stability/Clinical Decision Making  Evolving/Moderate complexity    Clinical Decision Making  Moderate    Rehab Potential  Good    PT Frequency  2x / week    PT Duration  8 weeks    PT Treatment/Interventions  ADLs/Self Care Home Management;Electrical Stimulation;Iontophoresis 4mg /ml Dexamethasone;Moist Heat;Traction;Ultrasound;DME Instruction;Functional mobility training;Therapeutic activities;Therapeutic exercise;Neuromuscular re-education;Patient/family education;Manual techniques;Passive range of motion;Taping;Scar mobilization    PT Next Visit Plan  continue to progres functional strengthening    PT Home Exercise Plan  pendulum, scap retraction to neutral, wand ER and flexion; scap retraction and extension    Consulted and Agree with Plan of Care  Patient       Patient will benefit from skilled  therapeutic intervention in order to improve the following deficits and impairments:  Decreased range of  motion, Decreased activity tolerance, Decreased strength, Pain, Postural dysfunction, Impaired flexibility, Increased muscle spasms, Decreased mobility, Improper body mechanics, Impaired perceived functional ability  Visit Diagnosis: Stiffness of left shoulder, not elsewhere classified  Acute pain of left shoulder  Muscle weakness (generalized)     Problem List Patient Active Problem List   Diagnosis Date Noted  . Polyneuropathy 03/20/2019  . Numbness 03/20/2019  . Gait disturbance 03/20/2019  . Impingement syndrome of right shoulder 05/16/2017  . Special screening for malignant neoplasms, colon 11/28/2011  . Diverticulosis of colon (without mention of hemorrhage) 11/28/2011  . Abdominal pain 11/15/2011  . Polyp of gallbladder 11/15/2011    Carney Living PT DPT  09/06/2019, 8:41 AM  [facility] [address] [place], [place], [address] Phone: [phone]   Fax:  [phone]  Name: Julia Kim MRN: [id number] Date of Birth: 25-Mar-1956

## 2019-09-11 ENCOUNTER — Encounter: Payer: Self-pay | Admitting: Physical Therapy

## 2019-09-11 ENCOUNTER — Other Ambulatory Visit: Payer: Self-pay

## 2019-09-11 ENCOUNTER — Ambulatory Visit: Payer: 59 | Admitting: Physical Therapy

## 2019-09-11 DIAGNOSIS — M25612 Stiffness of left shoulder, not elsewhere classified: Secondary | ICD-10-CM | POA: Diagnosis not present

## 2019-09-11 DIAGNOSIS — M25512 Pain in left shoulder: Secondary | ICD-10-CM

## 2019-09-11 DIAGNOSIS — M6281 Muscle weakness (generalized): Secondary | ICD-10-CM

## 2019-09-11 DIAGNOSIS — M542 Cervicalgia: Secondary | ICD-10-CM | POA: Diagnosis not present

## 2019-09-12 ENCOUNTER — Encounter: Payer: Self-pay | Admitting: Physical Therapy

## 2019-09-12 ENCOUNTER — Ambulatory Visit: Payer: 59 | Admitting: Physical Therapy

## 2019-09-12 DIAGNOSIS — M25612 Stiffness of left shoulder, not elsewhere classified: Secondary | ICD-10-CM

## 2019-09-12 DIAGNOSIS — M25512 Pain in left shoulder: Secondary | ICD-10-CM

## 2019-09-12 DIAGNOSIS — M6281 Muscle weakness (generalized): Secondary | ICD-10-CM | POA: Diagnosis not present

## 2019-09-12 DIAGNOSIS — M542 Cervicalgia: Secondary | ICD-10-CM | POA: Diagnosis not present

## 2019-09-12 NOTE — Therapy (Signed)
[place], [place], [address] Phone: [phone]   Fax:  [phone]  Physical Therapy Treatment  Patient Details  Name: Julia Kim MRN: [id number] Date of Birth: 06/06/1956 Referring Provider (PT): Dr Tania Ade    Encounter Date: 09/11/2019  PT End of Session - 09/12/19 1121    Visit Number  20    Number of Visits  24    Date for PT Re-Evaluation  09/25/19    PT Start Time  1627    PT Stop Time  1705    PT Time Calculation (min)  38 min    Activity Tolerance  Patient tolerated treatment well    Behavior During Therapy  [facility] for tasks assessed/performed       Past Medical History:  Diagnosis Date  . Allergic sinusitis   . 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 Full dentures   . GERD (gastroesophageal reflux disease)   . Glaucoma, both eyes   . Hiatal hernia   . Hyperlipidemia   . Hyperlipidemia   . Hypertension   . Partial tear of left rotator cuff   . Vitamin D deficiency   . Wears glasses     Past Surgical History:  Procedure Laterality Date  . CESAREAN SECTION  1975  . COLONOSCOPY    . FOOT SURGERY Right 2002  . SHOULDER ARTHROSCOPY WITH OPEN ROTATOR CUFF REPAIR Left 06/17/2019   Procedure: SHOULDER ARTHROSCOPY WITH OPEN ROTATOR CUFF REPAIR, SUBACROMIAL DECOMPRESSION;  Surgeon: Tania Ade, MD;  Location: [place];  Service: Orthopedics;  Laterality: Left;  . TRIGGER FINGER RELEASE Right 09/15/2017   Procedure: RELEASE TRIGGER FINGER/A-1 PULLEY RIGHT THUMB;  Surgeon: Daryll Brod, MD;  Location: [place];  Service: Orthopedics;  Laterality: Right;  Bier block  . ULNAR NERVE TRANSPOSITION Right 09/20/2013   Procedure: RIGHT ULNAR NERVE DECOMPRESSION;  Surgeon: Wynonia Sours, MD;  Location: [place];  Service: Orthopedics;  Laterality: Right;  . UPPER GASTROINTESTINAL ENDOSCOPY    . UPPER GI ENDOSCOPY      There were no  vitals filed for this visit.  Subjective Assessment - 09/11/19 1645    Subjective  Patient continues to have no complaints. She was able to push up from the bathtub the other day.    Patient Stated Goals  Nothing post-op    Currently in Pain?  No/denies                       [facility] Adult PT Treatment/Exercise - 09/12/19 0001      Shoulder Exercises: Supine   Protraction Limitations  with 3lb wand       Shoulder Exercises: Standing   External Rotation  Left;15 reps    Theraband Level (Shoulder External Rotation)  Level 2 (Red)    Theraband Level (Shoulder Internal Rotation)  Level 4 (Blue)    Extension  20 reps    Theraband Level (Shoulder Extension)  Level 4 (Blue)    Row  20 reps    Theraband Level (Shoulder Row)  Level 4 (Blue)    Other Standing Exercises  finger ladder x4 ; standing shoulder flexion in mirro 2lb 2x10     Other Standing Exercises  shoulder flexion to cabinet without weight; IR stretching with mulligan mobilization 5x 8 sec hold       Shoulder Exercises: Pulleys   Flexion Limitations  2 min     Scaption Limitations  2  min       Manual Therapy   Manual Therapy  Passive ROM;Joint mobilization    Manual therapy comments  less PROM required today 2nd to improved motion     Joint Mobilization  grade I and II PA and AP mobilizations to the right shoulder.     Passive ROM  Left shoulder PROM within MD protocol parameters             PT Education - 09/12/19 1120    Person(s) Educated  Patient    Methods  Explanation;Demonstration;Tactile cues;Verbal cues    Comprehension  Verbalized understanding;Returned demonstration;Verbal cues required;Tactile cues required       PT Short Term Goals - 09/06/19 0839      PT SHORT TERM GOAL #1   Title  Patient will increase passive ER to 30 degrees    Baseline  50    Period  Weeks    Status  Achieved      PT SHORT TERM GOAL #2   Title  Patient will increase passive flexion to 100 degrees     Baseline  full    Time  4    Period  Weeks    Status  Achieved      PT SHORT TERM GOAL #3   Title  Patiewnt will begin light AAROM per protocol    Baseline  has begun without difficulty    Time  4    Period  Weeks    Status  Achieved        PT Long Term Goals - 09/06/19 [id number]      PT LONG TERM GOAL #1   Title  Patient will reach behind her head in order to do her hair    Baseline  verbalized independence    Time  8    Period  Weeks    Status  On-going      PT LONG TERM GOAL #2   Title  Patient will reach behind her back to L3 in order to perfrom daily tasks    Baseline  able without pain    Time  8    Period  Weeks    Status  On-going            Plan - 09/12/19 1122    Clinical Impression Statement  Patients active IR is improving. Therapy will continue to focus on it over the next few visits. Minor Passive ER limitations noted. Overall patient is progressing towards D/C. patient encouraged to continue with her HEP.    Personal Factors and Comorbidities  Comorbidity 1    Comorbidities  lupis    Examination-Activity Limitations  Reach Overhead;Self Feeding;Carry    Examination-Participation Restrictions  Cleaning;Personal Finances    Stability/Clinical Decision Making  Evolving/Moderate complexity    Clinical Decision Making  Moderate    Rehab Potential  Good    PT Frequency  2x / week    PT Duration  8 weeks    PT Treatment/Interventions  ADLs/Self Care Home Management;Electrical Stimulation;Iontophoresis 4mg /ml Dexamethasone;Moist Heat;Traction;Ultrasound;DME Instruction;Functional mobility training;Therapeutic activities;Therapeutic exercise;Neuromuscular re-education;Patient/family education;Manual techniques;Passive range of motion;Taping;Scar mobilization    PT Next Visit Plan  continue to progres functional strengthening    PT Home Exercise Plan  pendulum, scap retraction to neutral, wand ER and flexion; scap retraction and extension       Patient will  benefit from skilled therapeutic intervention in order to improve the following deficits and impairments:  Decreased range of motion, Decreased activity tolerance, Decreased strength,  Pain, Postural dysfunction, Impaired flexibility, Increased muscle spasms, Decreased mobility, Improper body mechanics, Impaired perceived functional ability  Visit Diagnosis: Stiffness of left shoulder, not elsewhere classified  Acute pain of left shoulder  Muscle weakness (generalized)     Problem List Patient Active Problem List   Diagnosis Date Noted  . Polyneuropathy 03/20/2019  . Numbness 03/20/2019  . Gait disturbance 03/20/2019  . Impingement syndrome of right shoulder 05/16/2017  . Special screening for malignant neoplasms, colon 11/28/2011  . Diverticulosis of colon (without mention of hemorrhage) 11/28/2011  . Abdominal pain 11/15/2011  . Polyp of gallbladder 11/15/2011    Dessie Coma PT DPT  09/12/2019, 11:24 AM   [facility] [address] [place], [place], [address] Phone: [phone]   Fax:  [phone]  Name: Julia Kim MRN: [id number] Date of Birth: 12/02/1956

## 2019-09-13 ENCOUNTER — Encounter: Payer: Self-pay | Admitting: Physical Therapy

## 2019-09-13 NOTE — Therapy (Signed)
[facility] [address] [place], [place], [address] Phone: [phone]   Fax:  [phone]  Physical Therapy Treatment  Patient Details  Name: Julia Kim MRN: [id number] Date of Birth: 1956/03/22 Referring Provider (PT): Dr Jones Broom    Encounter Date: 09/12/2019  PT End of Session - 09/12/19 1626    Visit Number  21    Number of Visits  24    Date for PT Re-Evaluation  09/25/19    Authorization Type  [facility]    PT Start Time  1620    PT Stop Time  1700    PT Time Calculation (min)  40 min    Activity Tolerance  Patient tolerated treatment well    Behavior During Therapy  [facility] for tasks assessed/performed       Past Medical History:  Diagnosis Date  . Allergic sinusitis   . 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 Full dentures   . GERD (gastroesophageal reflux disease)   . Glaucoma, both eyes   . Hiatal hernia   . Hyperlipidemia   . Hyperlipidemia   . Hypertension   . Partial tear of left rotator cuff   . Vitamin D deficiency   . Wears glasses     Past Surgical History:  Procedure Laterality Date  . CESAREAN SECTION  1975  . COLONOSCOPY    . FOOT SURGERY Right 2002  . SHOULDER ARTHROSCOPY WITH OPEN ROTATOR CUFF REPAIR Left 06/17/2019   Procedure: SHOULDER ARTHROSCOPY WITH OPEN ROTATOR CUFF REPAIR, SUBACROMIAL DECOMPRESSION;  Surgeon: Jones Broom, MD;  Location: [facility] [place];  Service: Orthopedics;  Laterality: Left;  . TRIGGER FINGER RELEASE Right 09/15/2017   Procedure: RELEASE TRIGGER FINGER/A-1 PULLEY RIGHT THUMB;  Surgeon: Cindee Salt, MD;  Location: [facility];  Service: Orthopedics;  Laterality: Right;  Bier block  . ULNAR NERVE TRANSPOSITION Right 09/20/2013   Procedure: RIGHT ULNAR NERVE DECOMPRESSION;  Surgeon: Nicki Reaper, MD;  Location: [facility];  Service: Orthopedics;  Laterality: Right;  . UPPER GASTROINTESTINAL ENDOSCOPY    . UPPER GI  ENDOSCOPY      There were no vitals filed for this visit.  Subjective Assessment - 09/12/19 1626    Subjective  Patient is making good progress. She has no complaints.    Patient Stated Goals  Nothing post-op    Currently in Pain?  No/denies         [facility] PT Assessment - 09/13/19 0001      AROM   Overall AROM Comments  IR: can reach to L2 without pain ; can reach behind her head without pain.       PROM   Left Shoulder Flexion  165 Degrees    Left Shoulder Internal Rotation  75 Degrees    Left Shoulder External Rotation  68 Degrees      Strength   Overall Strength Comments  5/5 gross                    [facility] Adult PT Treatment/Exercise - 09/13/19 0001      Shoulder Exercises: Supine   Protraction Limitations  with 3lb wand       Shoulder Exercises: Standing   External Rotation  Left;15 reps    Theraband Level (Shoulder External Rotation)  Level 2 (Red)    Theraband Level (Shoulder Internal Rotation)  Level 4 (Blue)    Extension  20 reps    Theraband Level (Shoulder Extension)  Level 4 (Blue)    Row  20 reps    Theraband Level (Shoulder Row)  Level 4 (Blue)    Other Standing Exercises  finger ladder x4 ; standing shoulder flexion in mirro 2lb 2x10     Other Standing Exercises  shoulder flexion to cabinet without weight; IR stretching with mulligan mobilization 5x 8 sec hold       Shoulder Exercises: Pulleys   Flexion Limitations  2 min     Scaption Limitations  2 min       Manual Therapy   Manual Therapy  Passive ROM;Joint mobilization    Manual therapy comments  less PROM required today 2nd to improved motion     Joint Mobilization  grade I and II PA and AP mobilizations to the right shoulder.     Passive ROM  Left shoulder PROM within MD protocol parameters             PT Education - 09/12/19 1626    Education Details  technique with ther-ex    Person(s) Educated  Patient    Methods  Explanation;Demonstration;Tactile cues;Verbal cues     Comprehension  Returned demonstration;Verbalized understanding;Verbal cues required;Tactile cues required       PT Short Term Goals - 09/06/19 0839      PT SHORT TERM GOAL #1   Title  Patient will increase passive ER to 30 degrees    Baseline  50    Period  Weeks    Status  Achieved      PT SHORT TERM GOAL #2   Title  Patient will increase passive flexion to 100 degrees    Baseline  full    Time  4    Period  Weeks    Status  Achieved      PT SHORT TERM GOAL #3   Title  Patiewnt will begin light AAROM per protocol    Baseline  has begun without difficulty    Time  4    Period  Weeks    Status  Achieved        PT Long Term Goals - 09/13/19 1243      PT LONG TERM GOAL #1   Title  Patient will reach behind her head in order to do her hair    Baseline  verbalized independence    Time  8    Period  Weeks    Status  Achieved      PT LONG TERM GOAL #2   Title  Patient will reach behind her back to L3 in order to perfrom daily tasks    Baseline  able without pain    Time  8    Period  Weeks    Status  Achieved      PT LONG TERM GOAL #3   Title  Patient will reach overhead to shelf to grab object without increased pain in order to perfrom ADL's    Baseline  Patient able to perfrom without pain    Time  8    Period  Weeks    Status  Achieved      PT LONG TERM GOAL #4   Title  report pain < 3/10 for improved function and pain    Baseline  no pain    Time  6    Period  Weeks    Status  Achieved            Plan - 09/13/19 1240    Clinical Impression Statement  Patient has made great progress. She has mild limitations in passive ER but her funtional ER is full. We have been woring on her functional IR and it is nearing WN. She is able to lift a 2lb wirght overhead without pain. She would like to do 1 more visit to review HEP then she will be discharged.    Personal Factors and Comorbidities  Comorbidity 1    Comorbidities  lupis    Examination-Activity  Limitations  Reach Overhead;Self Feeding;Carry    Stability/Clinical Decision Making  Evolving/Moderate complexity    Clinical Decision Making  Moderate    Rehab Potential  Good    PT Frequency  2x / week    PT Duration  8 weeks    PT Treatment/Interventions  ADLs/Self Care Home Management;Electrical Stimulation;Iontophoresis 4mg /ml Dexamethasone;Moist Heat;Traction;Ultrasound;DME Instruction;Functional mobility training;Therapeutic activities;Therapeutic exercise;Neuromuscular re-education;Patient/family education;Manual techniques;Passive range of motion;Taping;Scar mobilization    PT Next Visit Plan  continue to progres functional strengthening    PT Home Exercise Plan  pendulum, scap retraction to neutral, wand ER and flexion; scap retraction and extension    Consulted and Agree with Plan of Care  Patient       Patient will benefit from skilled therapeutic intervention in order to improve the following deficits and impairments:  Decreased range of motion, Decreased activity tolerance, Decreased strength, Pain, Postural dysfunction, Impaired flexibility, Increased muscle spasms, Decreased mobility, Improper body mechanics, Impaired perceived functional ability  Visit Diagnosis: Stiffness of left shoulder, not elsewhere classified  Acute pain of left shoulder  Muscle weakness (generalized)     Problem List Patient Active Problem List   Diagnosis Date Noted  . Polyneuropathy 03/20/2019  . Numbness 03/20/2019  . Gait disturbance 03/20/2019  . Impingement syndrome of right shoulder 05/16/2017  . Special screening for malignant neoplasms, colon 11/28/2011  . Diverticulosis of colon (without mention of hemorrhage) 11/28/2011  . Abdominal pain 11/15/2011  . Polyp of gallbladder 11/15/2011    Dessie Comaavid J Hendel Gatliff PT DPT  09/13/2019, 12:45 PM  [facility] [address] [place], [place], [address] Phone: [phone]   Fax:   [phone]  Name: Charleston RopesCheryl A Todorov MRN: [id number] Date of Birth: 02/02/1956

## 2019-09-18 ENCOUNTER — Other Ambulatory Visit: Payer: Self-pay | Admitting: Family Medicine

## 2019-09-18 ENCOUNTER — Other Ambulatory Visit: Payer: Self-pay

## 2019-09-18 ENCOUNTER — Ambulatory Visit: Payer: 59 | Admitting: Physical Therapy

## 2019-09-18 ENCOUNTER — Encounter: Payer: Self-pay | Admitting: Physical Therapy

## 2019-09-18 DIAGNOSIS — M6281 Muscle weakness (generalized): Secondary | ICD-10-CM

## 2019-09-18 DIAGNOSIS — M25612 Stiffness of left shoulder, not elsewhere classified: Secondary | ICD-10-CM

## 2019-09-18 DIAGNOSIS — M25512 Pain in left shoulder: Secondary | ICD-10-CM

## 2019-09-18 DIAGNOSIS — Z1231 Encounter for screening mammogram for malignant neoplasm of breast: Secondary | ICD-10-CM

## 2019-09-18 DIAGNOSIS — M542 Cervicalgia: Secondary | ICD-10-CM | POA: Diagnosis not present

## 2019-09-18 NOTE — Therapy (Signed)
[place], [place], [address] Phone: [phone]   Fax:  [phone]  Physical Therapy Treatment/Discharge   Patient Details  Name: Julia Kim MRN: [id number] Date of Birth: 05-Mar-1956 Referring Provider (PT): Dr Tania Ade    Encounter Date: 09/18/2019  PT End of Session - 09/18/19 1840    Visit Number  22    Number of Visits  24    Date for PT Re-Evaluation  09/25/19    Authorization Type  [facility]    PT Start Time  1630    PT Stop Time  1700   D/C visit. Only required 1/2 hour   PT Time Calculation (min)  30 min    Activity Tolerance  Patient tolerated treatment well    Behavior During Therapy  WFL for tasks assessed/performed       Past Medical History:  Diagnosis Date  . Allergic sinusitis   . 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 Full dentures   . GERD (gastroesophageal reflux disease)   . Glaucoma, both eyes   . Hiatal hernia   . Hyperlipidemia   . Hyperlipidemia   . Hypertension   . Partial tear of left rotator cuff   . Vitamin D deficiency   . Wears glasses     Past Surgical History:  Procedure Laterality Date  . CESAREAN SECTION  1975  . COLONOSCOPY    . FOOT SURGERY Right 2002  . SHOULDER ARTHROSCOPY WITH OPEN ROTATOR CUFF REPAIR Left 06/17/2019   Procedure: SHOULDER ARTHROSCOPY WITH OPEN ROTATOR CUFF REPAIR, SUBACROMIAL DECOMPRESSION;  Surgeon: Tania Ade, MD;  Location: [facility];  Service: Orthopedics;  Laterality: Left;  . TRIGGER FINGER RELEASE Right 09/15/2017   Procedure: RELEASE TRIGGER FINGER/A-1 PULLEY RIGHT THUMB;  Surgeon: Daryll Brod, MD;  Location: [facility];  Service: Orthopedics;  Laterality: Right;  Bier block  . ULNAR NERVE TRANSPOSITION Right 09/20/2013   Procedure: RIGHT ULNAR NERVE DECOMPRESSION;  Surgeon: Wynonia Sours, MD;  Location: [place];  Service: Orthopedics;  Laterality: Right;  . UPPER  GASTROINTESTINAL ENDOSCOPY    . UPPER GI ENDOSCOPY      There were no vitals filed for this visit.  Subjective Assessment - 09/18/19 1839    Subjective  Patient has been to the MD. He is happy with her progress.    Currently in Pain?  No/denies                       [facility] Adult PT Treatment/Exercise - 09/18/19 0001      Self-Care   Self-Care  Other Self-Care Comments    Other Self-Care Comments   reiviewed POC going forward and activity progression       Shoulder Exercises: Supine   Protraction Limitations  with 3lb wand       Shoulder Exercises: Standing   External Rotation  Left;15 reps    Theraband Level (Shoulder External Rotation)  Level 2 (Red)    Theraband Level (Shoulder Internal Rotation)  Level 4 (Blue)    Extension  20 reps    Theraband Level (Shoulder Extension)  Level 4 (Blue)    Row  20 reps    Theraband Level (Shoulder Row)  Level 4 (Blue)    Other Standing Exercises  finger ladder x4 ; standing shoulder flexion in mirro 2lb 2x10     Other Standing Exercises  shoulder flexion to cabinet without weight; IR stretching with  mulligan mobilization 5x 8 sec hold       Shoulder Exercises: Pulleys   Flexion Limitations  2 min     Scaption Limitations  2 min       Manual Therapy   Manual Therapy  Passive ROM;Joint mobilization    Manual therapy comments  less PROM required today 2nd to improved motion     Joint Mobilization  grade I and II PA and AP mobilizations to the right shoulder.     Passive ROM  Left shoulder PROM within MD protocol parameters             PT Education - 09/18/19 1840    Education Details  final HEP    Person(s) Educated  Patient    Methods  Explanation;Tactile cues;Demonstration       PT Short Term Goals - 09/18/19 1841      PT SHORT TERM GOAL #1   Title  Patient will increase passive ER to 30 degrees    Baseline  50    Time  4    Period  Weeks    Status  Achieved      PT SHORT TERM GOAL #2   Title   Patient will increase passive flexion to 100 degrees    Baseline  full    Time  4    Period  Weeks    Status  Achieved    Target Date  07/29/19      PT SHORT TERM GOAL #3   Title  Patiewnt will begin light AAROM per protocol    Baseline  has begun without difficulty    Time  4    Period  Weeks    Status  Achieved        PT Long Term Goals - 09/18/19 1842      PT LONG TERM GOAL #1   Title  Patient will reach behind her head in order to do her hair    Baseline  verbalized independence    Time  8    Period  Weeks    Status  Achieved      PT LONG TERM GOAL #2   Title  Patient will reach behind her back to L3 in order to perfrom daily tasks    Baseline  able without pain    Time  8    Status  Achieved      PT LONG TERM GOAL #3   Title  Patient will reach overhead to shelf to grab object without increased pain in order to perfrom ADL's    Baseline  Patient able to perfrom without pain    Time  8    Period  Weeks    Status  Achieved      PT LONG TERM GOAL #4   Title  report pain < 3/10 for improved function and pain    Baseline  no pain    Time  6    Period  Weeks    Status  Achieved            Plan - 09/18/19 1840    Clinical Impression Statement  Patient has reach all goals for therapy. She has gfull range and full function. She has not had pain for some time. She is comfortable with her HEP. D/C at this time.    Personal Factors and Comorbidities  Comorbidity 1    Comorbidities  lupis    Examination-Activity Limitations  Reach Overhead;Self Feeding;Carry    Examination-Participation Restrictions  Cleaning;Personal Finances    Stability/Clinical Decision Making  Evolving/Moderate complexity    Clinical Decision Making  Moderate    PT Frequency  2x / week    PT Treatment/Interventions  ADLs/Self Care Home Management;Electrical Stimulation;Iontophoresis 27m/ml Dexamethasone;Moist Heat;Traction;Ultrasound;DME Instruction;Functional mobility training;Therapeutic  activities;Therapeutic exercise;Neuromuscular re-education;Patient/family education;Manual techniques;Passive range of motion;Taping;Scar mobilization    PT Next Visit Plan  continue to progres functional strengthening    PT Home Exercise Plan  pendulum, scap retraction to neutral, wand ER and flexion; scap retraction and extension    Consulted and Agree with Plan of Care  Patient       Patient will benefit from skilled therapeutic intervention in order to improve the following deficits and impairments:  Decreased range of motion, Decreased activity tolerance, Decreased strength, Pain, Postural dysfunction, Impaired flexibility, Increased muscle spasms, Decreased mobility, Improper body mechanics, Impaired perceived functional ability  Visit Diagnosis: Stiffness of left shoulder, not elsewhere classified  Acute pain of left shoulder  Muscle weakness (generalized)  PHYSICAL THERAPY DISCHARGE SUMMARY  Visits from Start of Care: 22  Current functional level related to goals / functional outcomes: Full function without pain    Remaining deficits: None   Education / Equipment: HEP   Plan: Patient agrees to discharge.  Patient goals were met. Patient is being discharged due to meeting the stated rehab goals.  ?????      Problem List Patient Active Problem List   Diagnosis Date Noted  . Polyneuropathy 03/20/2019  . Numbness 03/20/2019  . Gait disturbance 03/20/2019  . Impingement syndrome of right shoulder 05/16/2017  . Special screening for malignant neoplasms, colon 11/28/2011  . Diverticulosis of colon (without mention of hemorrhage) 11/28/2011  . Abdominal pain 11/15/2011  . Polyp of gallbladder 11/15/2011    DCarney Living9/30/2020, 6:43 PM  [facility] [address]Phone: [phone]  Fax:  [phone] Name: Julia ROMAMRN: [id number] of Birth: 324-Dec-1957

## 2019-10-11 ENCOUNTER — Other Ambulatory Visit: Payer: Self-pay

## 2019-10-11 DIAGNOSIS — R1011 Right upper quadrant pain: Secondary | ICD-10-CM | POA: Diagnosis not present

## 2019-10-14 ENCOUNTER — Ambulatory Visit
Admission: RE | Admit: 2019-10-14 | Discharge: 2019-10-14 | Disposition: A | Discharge status: Home / Self Care | Payer: 59 | Source: Ambulatory Visit | Attending: Physician Assistant | Admitting: Physician Assistant

## 2019-10-14 DIAGNOSIS — R1011 Right upper quadrant pain: Secondary | ICD-10-CM

## 2019-10-14 DIAGNOSIS — K824 Cholesterolosis of gallbladder: Secondary | ICD-10-CM | POA: Diagnosis not present

## 2019-10-18 ENCOUNTER — Encounter: Payer: Self-pay | Admitting: Physician Assistant

## 2019-10-22 ENCOUNTER — Ambulatory Visit: Payer: 59 | Admitting: Physician Assistant

## 2019-10-22 ENCOUNTER — Encounter: Payer: Self-pay | Admitting: Physician Assistant

## 2019-10-22 VITALS — BP 110/60 | HR 76 | Temp 96.7°F | Ht 64.0 in | Wt 126.4 lb

## 2019-10-22 DIAGNOSIS — R1013 Epigastric pain: Secondary | ICD-10-CM

## 2019-10-22 DIAGNOSIS — K219 Gastro-esophageal reflux disease without esophagitis: Secondary | ICD-10-CM | POA: Diagnosis not present

## 2019-10-22 MED ORDER — ESOMEPRAZOLE MAGNESIUM 40 MG PO CPDR: 40.0000 mg | DELAYED_RELEASE_CAPSULE | Freq: Every day | ORAL | 1 refills | Status: DC

## 2019-10-22 MED FILL — ESOMEPRAZOLE MAG DR 40 MG C: 40 | 30 days supply | Qty: 30 | Fill #0

## 2019-10-22 NOTE — Patient Instructions (Addendum)
If you are age 63 or older, your body mass index should be between 23-30. Your Body mass index is 21.7 kg/m. If this is out of the aforementioned range listed, please consider follow up with your Primary Care Provider.  If you are age 43 or younger, your body mass index should be between 19-25. Your Body mass index is 21.7 kg/m. If this is out of the aformentioned range listed, please consider follow up with your Primary Care Provider.   We have sent the following medications to your pharmacy for you to pick up at your convenience:  Esomeprazole 40 mg 1 tablet 30-60 minutes before breakfast Discontinue your omeprazole  Thank you for choosing me and [facility]  Ellouise Newer, PA-C

## 2019-10-22 NOTE — Progress Notes (Signed)
Chief Complaint: Right upper quadrant pain and GERD  HPI:    Julia Kim is a 63 year old African-American female with a past medical history as listed below, known to Dr. Rhea BeltonPyrtle, who was referred to me by Julia Kim, David, MD for a complaint of right upper quadrant pain.      05/29/2014 colonoscopy with mild diverticulosis and otherwise normal.    10/14/2019 right upper quadrant ultrasound with several small gallbladder polyps measuring 5 mm or less, slightly prominent caudate lobe of the liver.    Today, the patient presents clinic and explains that 3 weeks ago she went out for a girls night and ate a large amount of sausage dip and drank some wine which she does not normally do.  A couple days later she developed a right upper quadrant/epigastric burning/"scratchy" pain which she rates as a 2/10 at its worst.  This is more "aggravating than anything".  It is worse when she is sitting or laying down.  She did take some Omeprazole for a few days and it went away, she then ate some fried fish and everything got worse.  She tells me that when she lays down to sleep at night this pain comes and sometimes she takes the Nexium and it helps it to go away.  Patient is on Omeprazole 20 mg but only takes this as needed, has not even been taking this every day.  Was using Advil occasionally for this pain but it did not really help.  Associated symptoms include some reflux and excessive burping.    Denies fever, chills, nausea, vomiting, change in bowel habits or symptoms that awaken her from sleep.     Past Medical History:  Diagnosis Date  . Allergic sinusitis   . 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 Full dentures   . GERD (gastroesophageal reflux disease)   . Glaucoma, both eyes   . Hiatal hernia   . Hyperlipidemia   . Hyperlipidemia   . Hypertension   . Partial tear of left rotator cuff   . Vitamin D deficiency   . Wears glasses     Past Surgical History:  Procedure Laterality Date  .  CESAREAN SECTION  1975  . COLONOSCOPY    . FOOT SURGERY Right 2002  . SHOULDER ARTHROSCOPY WITH OPEN ROTATOR CUFF REPAIR Left 06/17/2019   Procedure: SHOULDER ARTHROSCOPY WITH OPEN ROTATOR CUFF REPAIR, SUBACROMIAL DECOMPRESSION;  Surgeon: Jones Broomhandler, Justin, MD;  [facility];  Service: Orthopedics;  Laterality: Left;  . TRIGGER FINGER RELEASE Right 09/15/2017   Procedure: RELEASE TRIGGER FINGER/A-1 PULLEY RIGHT THUMB;  Surgeon: Cindee SaltKuzma, Gary, MD;  Location: [facility];  Service: Orthopedics;  Laterality: Right;  Bier block  . ULNAR NERVE TRANSPOSITION Right 09/20/2013   Procedure: RIGHT ULNAR NERVE DECOMPRESSION;  Surgeon: Nicki ReaperGary R Kuzma, MD;  Location: [facility];  Service: Orthopedics;  Laterality: Right;  . UPPER GASTROINTESTINAL ENDOSCOPY    . UPPER GI ENDOSCOPY      Current Outpatient Medications  Medication Sig Dispense Refill  . amLODipine (NORVASC) 2.5 MG tablet Take 2.5 mg by mouth daily.    . Brinzolamide-Brimonidine (SIMBRINZA) 1-0.2 % SUSP Place 1 drop into both eyes 2 (two) times daily.    Marland Kitchen. omeprazole (PRILOSEC OTC) 20 MG tablet Take 20 mg by mouth daily as needed.     . simvastatin (ZOCOR) 20 MG tablet Take 20 mg by mouth daily.    Marland Kitchen. spironolactone (ALDACTONE) 25 MG tablet Take 25 mg by mouth  daily.     . travoprost, benzalkonium, (TRAVATAN) 0.004 % ophthalmic solution Place 1 drop into both eyes at bedtime.    . Vitamin D, Ergocalciferol, (DRISDOL) 50000 UNITS CAPS Take 50,000 Units by mouth every 7 (seven) days.      No current facility-administered medications for this visit.     Allergies as of 10/22/2019 - Review Complete 10/22/2019  Allergen Reaction Noted  . Aspirin Other (See Comments) 11/14/2011    Family History  Problem Relation Age of Onset  . Heart disease Mother   . Hypertension Mother   . Kidney disease Mother   . Hypertension Father   . Stroke Maternal Grandmother   . Colon cancer Neg Hx   . Esophageal  cancer Neg Hx   . Stomach cancer Neg Hx   . Pancreatic cancer Neg Hx   . Liver disease Neg Hx     Social History   Socioeconomic History  . Marital status: Married    Spouse name: Sullivan Lone  . Number of children: 1  . Years of education: 42  . Highest education level: Not on file  Occupational History  . Occupation: Forensic scientist: [employer]    Employer: [employer]  Social Needs  . Financial resource strain: Not on file  . Food insecurity    Worry: Not on file    Inability: Not on file  . Transportation needs    Medical: Not on file    Non-medical: Not on file  Tobacco Use  . Smoking status: Former Smoker    Years: 15.00    Types: Cigarettes    Quit date: 12/19/1992    Years since quitting: 26.8  . Smokeless tobacco: Never Used  Substance and Sexual Activity  . Alcohol use: Yes    Comment: seldom  . Drug use: Never  . Sexual activity: Not on file  Lifestyle  . Physical activity    Days per week: Not on file    Minutes per session: Not on file  . Stress: Not on file  Relationships  . Social Musician on phone: Not on file    Gets together: Not on file    Attends religious service: Not on file    Active member of club or organization: Not on file    Attends meetings of clubs or organizations: Not on file    Relationship status: Not on file  . Intimate partner violence    Fear of current or ex partner: Not on file    Emotionally abused: Not on file    Physically abused: Not on file    Forced sexual activity: Not on file  Other Topics Concern  . Not on file  Social History Narrative   Lives with husband   Caffeine use: 1 caffeine drink daily    Left handed     Review of Systems:    Constitutional: No weight loss, fever or chills Skin: No rash Cardiovascular: No chest pain Respiratory: No SOB Gastrointestinal: See HPI and otherwise negative Genitourinary: No dysuria  Neurological: No headache, dizziness or syncope  Musculoskeletal: No new muscle or joint pain Hematologic: No bleeding  Psychiatric: No history of depression or anxiety   Physical Exam:  Vital signs: BP 110/60   Pulse 76   Temp (!) 96.7 F (35.9 C)   Ht 5\' 4"  (1.626 m)   Wt 126 lb 6.4 oz (57.3 kg)   BMI 21.70 kg/m   Constitutional:   Pleasant  AA female appears to be in NAD, Well developed, Well nourished, alert and cooperative Head:  Normocephalic and atraumatic. Eyes:   PEERL, EOMI. No icterus. Conjunctiva pink. Ears:  Normal auditory acuity. Neck:  Supple Throat: Oral cavity and pharynx without inflammation, swelling or lesion.  Respiratory: Respirations even and unlabored. Lungs clear to auscultation bilaterally.   No wheezes, crackles, or rhonchi.  Cardiovascular: Normal S1, S2. No MRG. Regular rate and rhythm. No peripheral edema, cyanosis or pallor.  Gastrointestinal:  Soft, nondistended,Moderate epigastric ttp, mild RUQ ttp. No rebound or guarding. Normal bowel sounds. No appreciable masses or hepatomegaly. Rectal:  Not performed.  Msk:  Symmetrical without gross deformities. Without edema, no deformity or joint abnormality.  Neurologic:  Alert and  oriented x4;  grossly normal neurologically.  Skin:   Dry and intact without significant lesions or rashes. Psychiatric: Demonstrates good judgement and reason without abnormal affect or behaviors.  See HPI for recent imaging.  Assessment: 1.  Epigastric pain: Radiates over to right upper quadrant, ultrasound normal, most likely with increased reflux below related to gastritis 2.  GERD: Increased over the past couple of weeks with pain as above; most likely gastritis 3.  Screening for colorectal cancer: Last colonoscopy in 2015 with recommendations for repeat in 10 years  Plan: 1.  Prescribed Esomeprazole 40 mg daily, 30-60 minutes before breakfast #30 with 3 refills. 2.  Discontinue Omeprazole for now. 3.  Reviewed antireflux diet and lifestyle modifications. 4.   Patient will call our clinic in 1 week if she is not feeling any better, at that time would recommend increasing to twice daily dosing. 5.  Patient to follow in clinic with me in 4 to 6 weeks.  If no better would recommend an EGD.  Ellouise Newer, PA-C [facility] 10/22/2019, 9:03 AM  Cc: Antony Contras, MD

## 2019-10-22 NOTE — Progress Notes (Signed)
Addendum: Reviewed and agree with assessment and management plan. Agree with initial plan.  Recent abd [place] with slightly prominent caudate lobe of liver.  On return visit would recommend checking CBC, CMP, INR, and with viral hep studies.  If any are abnormal we can work her up further for any evidence of chronic liver disease Halleigh Comes, Lajuan Lines, MD

## 2019-11-01 ENCOUNTER — Ambulatory Visit
Admission: RE | Admit: 2019-11-01 | Discharge: 2019-11-01 | Disposition: A | Discharge status: Home / Self Care | Payer: 59 | Source: Ambulatory Visit | Attending: Family Medicine | Admitting: Family Medicine

## 2019-11-01 ENCOUNTER — Other Ambulatory Visit: Payer: Self-pay

## 2019-11-01 DIAGNOSIS — Z1231 Encounter for screening mammogram for malignant neoplasm of breast: Secondary | ICD-10-CM | POA: Diagnosis not present

## 2019-11-11 MED FILL — SPIRONOLACTONE 25 MG TABS: 25 | 90 days supply | Qty: 90 | Fill #1

## 2019-11-11 MED FILL — AMLODIPINE 2.5 MG TABLET: 2.5 | 90 days supply | Qty: 90 | Fill #1

## 2019-11-22 ENCOUNTER — Ambulatory Visit (INDEPENDENT_AMBULATORY_CARE_PROVIDER_SITE_OTHER): Payer: 59 | Admitting: Physician Assistant

## 2019-11-22 ENCOUNTER — Other Ambulatory Visit (INDEPENDENT_AMBULATORY_CARE_PROVIDER_SITE_OTHER): Payer: 59

## 2019-11-22 ENCOUNTER — Other Ambulatory Visit: Payer: Self-pay

## 2019-11-22 ENCOUNTER — Encounter: Payer: Self-pay | Admitting: Physician Assistant

## 2019-11-22 VITALS — BP 134/78 | HR 65 | Temp 97.7°F | Ht 65.0 in | Wt 127.0 lb

## 2019-11-22 DIAGNOSIS — R932 Abnormal findings on diagnostic imaging of liver and biliary tract: Secondary | ICD-10-CM

## 2019-11-22 DIAGNOSIS — K219 Gastro-esophageal reflux disease without esophagitis: Secondary | ICD-10-CM

## 2019-11-22 LAB — CBC WITH DIFFERENTIAL/PLATELET
Basophils Absolute: 0.1 10*3/uL (ref 0.0–0.1)
Basophils Relative: 1.4 % (ref 0.0–3.0)
Eosinophils Absolute: 0.1 10*3/uL (ref 0.0–0.7)
Eosinophils Relative: 1.6 % (ref 0.0–5.0)
HCT: 40.7 % (ref 36.0–46.0)
Hemoglobin: 13.8 g/dL (ref 12.0–15.0)
Lymphocytes Relative: 41.6 % (ref 12.0–46.0)
Lymphs Abs: 1.6 10*3/uL (ref 0.7–4.0)
MCHC: 33.8 g/dL (ref 30.0–36.0)
MCV: 88.9 fl (ref 78.0–100.0)
Monocytes Absolute: 0.3 10*3/uL (ref 0.1–1.0)
Monocytes Relative: 9.2 % (ref 3.0–12.0)
Neutro Abs: 1.7 10*3/uL (ref 1.4–7.7)
Neutrophils Relative %: 46.2 % (ref 43.0–77.0)
Platelets: 234 10*3/uL (ref 150.0–400.0)
RBC: 4.58 Mil/uL (ref 3.87–5.11)
RDW: 13.1 % (ref 11.5–15.5)
WBC: 3.8 10*3/uL — ABNORMAL LOW (ref 4.0–10.5)

## 2019-11-22 LAB — PROTIME-INR
INR: 1 ratio (ref 0.8–1.0)
Prothrombin Time: 11.6 s (ref 9.6–13.1)

## 2019-11-22 MED FILL — ESOMEPRAZOLE MAG DR 40 MG C: 40 | 30 days supply | Qty: 30 | Fill #1

## 2019-11-22 NOTE — Patient Instructions (Signed)
If you are age 63 or older, your body mass index should be between 23-30. Your Body mass index is 21.13 kg/m. If this is out of the aforementioned range listed, please consider follow up with your Primary Care Provider.  If you are age 19 or younger, your body mass index should be between 19-25. Your Body mass index is 21.13 kg/m. If this is out of the aformentioned range listed, please consider follow up with your Primary Care Provider.    Your provider has requested that you go to the basement level for lab work before leaving today. Press "B" on the elevator. The lab is located at the first door on the left as you exit the elevator.  Please continue with Esomeprazole40 mg daily  Thank you for choosing me and [facility]

## 2019-11-22 NOTE — Progress Notes (Signed)
Chief Complaint: Follow-up GERD  HPI:    [name] is a 63 year old African-American female with a past medical history as listed below, known to Dr. Hilarie Fredrickson, who returns clinic today for follow-up of reflux.      11/18/2019 patient seen in clinic by me for right upper quadrant pain and reflux.  She had an ultrasound 10/14/2019 with several small gallbladder polyps and slightly prominent caudate lobe of the liver.  Stopped omeprazole.  Started Esomeprazole 40 mg daily.  Was also recommended at follow-up that patient have CBC, CMP, INR and viral hepatitis studies given her slightly prominent caudate lobe of the liver.    Today, the patient tells me that she feels completely better.  "It took a few weeks", but the Nexium 40 mg daily has completely relieved her epigastric discomfort.  She tells me now she only has "slight twinges now and then".  Patient is very happy with the results.    Denies fever, chills, nausea or vomiting.  Past Medical History:  Diagnosis Date  . Allergic sinusitis   . 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 Full dentures   . GERD (gastroesophageal reflux disease)   . Glaucoma, both eyes   . Hiatal hernia   . Hyperlipidemia   . Hyperlipidemia   . Hypertension   . Partial tear of left rotator cuff   . Vitamin D deficiency   . Wears glasses     Past Surgical History:  Procedure Laterality Date  . CESAREAN SECTION  1975  . COLONOSCOPY    . FOOT SURGERY Right 2002  . SHOULDER ARTHROSCOPY WITH OPEN ROTATOR CUFF REPAIR Left 06/17/2019   Procedure: SHOULDER ARTHROSCOPY WITH OPEN ROTATOR CUFF REPAIR, SUBACROMIAL DECOMPRESSION;  Surgeon: Tania Ade, MD;  Location: [place];  Service: Orthopedics;  Laterality: Left;  . TRIGGER FINGER RELEASE Right 09/15/2017   Procedure: RELEASE TRIGGER FINGER/A-1 PULLEY RIGHT THUMB;  Surgeon: Daryll Brod, MD;  Location: [facility];  Service: Orthopedics;  Laterality: Right;  Bier block  . ULNAR  NERVE TRANSPOSITION Right 09/20/2013   Procedure: RIGHT ULNAR NERVE DECOMPRESSION;  Surgeon: Wynonia Sours, MD;  Location: [facility];  Service: Orthopedics;  Laterality: Right;  . UPPER GASTROINTESTINAL ENDOSCOPY    . UPPER GI ENDOSCOPY      Current Outpatient Medications  Medication Sig Dispense Refill  . amLODipine (NORVASC) 2.5 MG tablet Take 2.5 mg by mouth daily.    . Brinzolamide-Brimonidine (SIMBRINZA) 1-0.2 % SUSP Place 1 drop into both eyes 2 (two) times daily.    . simvastatin (ZOCOR) 20 MG tablet Take 20 mg by mouth daily.    Marland Kitchen spironolactone (ALDACTONE) 25 MG tablet Take 25 mg by mouth daily.     . travoprost, benzalkonium, (TRAVATAN) 0.004 % ophthalmic solution Place 1 drop into both eyes at bedtime.    . Vitamin D, Ergocalciferol, (DRISDOL) 50000 UNITS CAPS Take 50,000 Units by mouth every 7 (seven) days.     Marland Kitchen esomeprazole (NEXIUM) 40 MG capsule Take 1 capsule (40 mg total) by mouth daily before breakfast. 30 capsule 1   No current facility-administered medications for this visit.     Allergies as of 11/22/2019 - Review Complete 11/22/2019  Allergen Reaction Noted  . Aspirin Other (See Comments) 11/14/2011    Family History  Problem Relation Age of Onset  . Heart disease Mother   . Hypertension Mother   . Kidney disease Mother   . Hypertension Father   . Stroke Maternal Grandmother   .  Colon cancer Neg Hx   . Esophageal cancer Neg Hx   . Stomach cancer Neg Hx   . Pancreatic cancer Neg Hx   . Liver disease Neg Hx     Social History   Socioeconomic History  . Marital status: Married    Spouse name: Sullivan Lone  . Number of children: 1  . Years of education: 67  . Highest education level: Not on file  Occupational History  . Occupation: Forensic scientist: [employer]    Employer: [employer]  Social Needs  . Financial resource strain: Not on file  . Food insecurity    Worry: Not on file    Inability: Not on file  . Transportation  needs    Medical: Not on file    Non-medical: Not on file  Tobacco Use  . Smoking status: Former Smoker    Years: 15.00    Types: Cigarettes    Quit date: 12/19/1992    Years since quitting: 26.9  . Smokeless tobacco: Never Used  Substance and Sexual Activity  . Alcohol use: Yes    Comment: seldom  . Drug use: Never  . Sexual activity: Not on file  Lifestyle  . Physical activity    Days per week: Not on file    Minutes per session: Not on file  . Stress: Not on file  Relationships  . Social Musician on phone: Not on file    Gets together: Not on file    Attends religious service: Not on file    Active member of club or organization: Not on file    Attends meetings of clubs or organizations: Not on file    Relationship status: Not on file  . Intimate partner violence    Fear of current or ex partner: Not on file    Emotionally abused: Not on file    Physically abused: Not on file    Forced sexual activity: Not on file  Other Topics Concern  . Not on file  Social History Narrative   Lives with husband   Caffeine use: 1 caffeine drink daily    Left handed     Review of Systems:    Constitutional: No weight loss, fever or chills Cardiovascular: No chest pain Respiratory: No SOB  Gastrointestinal: See HPI and otherwise negative   Physical Exam:  Vital signs: BP 134/78   Pulse 65   Temp 97.7 F (36.5 C)   Ht 5\' 5"  (1.651 m)   Wt 127 lb (57.6 kg)   BMI 21.13 kg/m   Constitutional:   Pleasant AA female appears to be in NAD, Well developed, Well nourished, alert and cooperative Respiratory: Respirations even and unlabored. Lungs clear to auscultation bilaterally.   No wheezes, crackles, or rhonchi.  Cardiovascular: Normal S1, S2. No MRG. Regular rate and rhythm. No peripheral edema, cyanosis or pallor.  Gastrointestinal:  Soft, nondistended, nontender. No rebound or guarding. Normal bowel sounds. No appreciable masses or hepatomegaly. Rectal:  Not  performed.  Psychiatric: Demonstrates good judgement and reason without abnormal affect or behaviors.  No recent labs.  Assessment: 1.  GERD: Better with change to Esomeprazole 40 mg daily 2.  Abnormal imaging of the liver: Ultrasound was slightly prominent caudate lobe  Plan: 1.  Ordered labs to include CBC, CMP, INR and viral hepatitis studies 2.  Patient to continue Esomeprazole 40 mg daily 3.  Patient to follow in clinic as needed with or as directed  by labs above.  Hyacinth MeekerJennifer Shaleena Crusoe, PA-C [facility] 11/22/2019, 9:11 AM  Cc: Tally JoeSwayne, David, MD

## 2019-11-25 LAB — HEPATITIS B CORE ANTIBODY, TOTAL: Hep B Core Total Ab: NONREACTIVE

## 2019-11-25 LAB — HEPATITIS C ANTIBODY
Hepatitis C Ab: NONREACTIVE
SIGNAL TO CUT-OFF: 0.01 (ref <1.00)

## 2019-11-25 LAB — HEPATITIS A ANTIBODY, TOTAL: Hepatitis A AB,Total: REACTIVE — AB

## 2019-11-25 LAB — HEPATITIS B SURFACE ANTIBODY,QUALITATIVE: Hep B S Ab: REACTIVE — AB

## 2019-11-25 NOTE — Progress Notes (Signed)
Addendum: Reviewed and agree with assessment and management plan. Pyrtle, Jay M, MD  

## 2019-11-28 ENCOUNTER — Other Ambulatory Visit: Payer: Self-pay

## 2019-11-28 ENCOUNTER — Telehealth: Payer: Self-pay

## 2019-11-28 DIAGNOSIS — R932 Abnormal findings on diagnostic imaging of liver and biliary tract: Secondary | ICD-10-CM

## 2019-11-28 NOTE — Telephone Encounter (Signed)
Left message to please call back. °

## 2019-11-28 NOTE — Telephone Encounter (Signed)
See result note.  

## 2019-11-29 ENCOUNTER — Other Ambulatory Visit (INDEPENDENT_AMBULATORY_CARE_PROVIDER_SITE_OTHER): Payer: 59

## 2019-11-29 ENCOUNTER — Telehealth: Payer: Self-pay

## 2019-11-29 DIAGNOSIS — R932 Abnormal findings on diagnostic imaging of liver and biliary tract: Secondary | ICD-10-CM | POA: Diagnosis not present

## 2019-11-29 LAB — HEPATIC FUNCTION PANEL
ALT: 12 U/L (ref 0–35)
AST: 13 U/L (ref 0–37)
Albumin: 4.5 g/dL (ref 3.5–5.2)
Alkaline Phosphatase: 64 U/L (ref 39–117)
Bilirubin, Direct: 0 mg/dL (ref 0.0–0.3)
Total Bilirubin: 0.3 mg/dL (ref 0.2–1.2)
Total Protein: 7.5 g/dL (ref 6.0–8.3)

## 2019-11-29 NOTE — Telephone Encounter (Signed)
Left message to please call back. °

## 2019-12-16 ENCOUNTER — Other Ambulatory Visit: Payer: Self-pay

## 2019-12-16 ENCOUNTER — Telehealth: Payer: Self-pay | Admitting: Physician Assistant

## 2019-12-16 MED ORDER — ESOMEPRAZOLE MAGNESIUM 40 MG PO CPDR: 40.0000 mg | DELAYED_RELEASE_CAPSULE | Freq: Every day | ORAL | 1 refills | Status: AC

## 2019-12-16 MED FILL — ESOMEPRAZOLE MAG DR 40 MG C: 40 | 30 days supply | Qty: 30 | Fill #0

## 2019-12-16 NOTE — Telephone Encounter (Signed)
Prescription sent to pharmacy. Pt aware. 

## 2020-01-14 MED FILL — SIMVASTATIN 20 MG TABLET: 20 | 90 days supply | Qty: 90 | Fill #0

## 2020-01-14 MED FILL — VIT D2 1.25 MG (50,000 UNIT: 1.25 MG | 84 days supply | Qty: 12 | Fill #0

## 2020-01-30 ENCOUNTER — Ambulatory Visit: Payer: Self-pay | Admitting: Physician Assistant

## 2020-01-30 ENCOUNTER — Encounter: Payer: Self-pay | Admitting: Physician Assistant

## 2020-01-30 VITALS — BP 160/74 | HR 76 | Temp 98.5°F | Ht 64.0 in | Wt 128.0 lb

## 2020-01-30 DIAGNOSIS — R142 Eructation: Secondary | ICD-10-CM

## 2020-01-30 DIAGNOSIS — R1906 Epigastric swelling, mass or lump: Secondary | ICD-10-CM

## 2020-01-30 MED ORDER — FAMOTIDINE 20 MG PO TABS: ORAL_TABLET | ORAL | 5 refills | Status: AC

## 2020-01-30 MED FILL — FAMOTIDINE 20 MG TABLET: 20 | 30 days supply | Qty: 30 | Fill #0

## 2020-01-30 NOTE — Progress Notes (Signed)
Chief Complaint: "Indigestion issues"  HPI:    Julia Kim is a 64 year old African-American female with past medical history as listed below, known to Dr. Hilarie Fredrickson, who returns to clinic today for "indigestion issues".    11/22/2019 patient was seen for follow-up of reflux.  At that time, she felt completely better after taking the 40 mg of Nexium daily.  Also discussed that she had an abnormal ultrasound 10/14/2019 with a prominent caudate lobe, work-up this is negative/normal including CBC, CMP, INR and viral hepatitis studies.  She was continued on Nexium 40 mg daily.  Dr. Hilarie Fredrickson recommended repeat right upper quadrant ultrasound with elastography in 6 months.    Today, the patient presents to clinic and explains that she has been doing well she does not have any further epigastric pain but she does continue with a feeling of fullness typically right after eating certain foods up under her rib cage.  Explains that at that point she may have some excess eructations and feels like her food just kind of sits there for a while, after a couple of hours it goes down and goes away and if she has a good bowel movement it goes away completely.  Tells me occasionally she feels like there are "bubbles in there I could grab and pop".  Has noticed that when she eats fried foods and makes his symptoms worse.  Continues her Nexium 40 mg daily.    Denies fever, chills, change in bowel habits or blood in her stool.     Past Medical History:  Diagnosis Date  . Allergic sinusitis   . 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 Full dentures   . GERD (gastroesophageal reflux disease)   . Glaucoma, both eyes   . Hiatal hernia   . Hyperlipidemia   . Hyperlipidemia   . Hypertension   . Partial tear of left rotator cuff   . Vitamin D deficiency   . Wears glasses     Past Surgical History:  Procedure Laterality Date  . CESAREAN SECTION  1975  . COLONOSCOPY    . FOOT SURGERY Right 2002  . SHOULDER ARTHROSCOPY  WITH OPEN ROTATOR CUFF REPAIR Left 06/17/2019   Procedure: SHOULDER ARTHROSCOPY WITH OPEN ROTATOR CUFF REPAIR, SUBACROMIAL DECOMPRESSION;  Surgeon: Tania Ade, MD;  Location: [facility];  Service: Orthopedics;  Laterality: Left;  . TRIGGER FINGER RELEASE Right 09/15/2017   Procedure: RELEASE TRIGGER FINGER/A-1 PULLEY RIGHT THUMB;  Surgeon: Daryll Brod, MD;  Location: [place] [facility];  Service: Orthopedics;  Laterality: Right;  Bier block  . ULNAR NERVE TRANSPOSITION Right 09/20/2013   Procedure: RIGHT ULNAR NERVE DECOMPRESSION;  Surgeon: Wynonia Sours, MD;  Location: [facility];  Service: Orthopedics;  Laterality: Right;  . UPPER GASTROINTESTINAL ENDOSCOPY    . UPPER GI ENDOSCOPY      Current Outpatient Medications  Medication Sig Dispense Refill  . amLODipine (NORVASC) 2.5 MG tablet Take 2.5 mg by mouth daily.    . Brinzolamide-Brimonidine (SIMBRINZA) 1-0.2 % SUSP Place 1 drop into both eyes 2 (two) times daily.    Marland Kitchen esomeprazole (NEXIUM) 40 MG capsule Take 1 capsule (40 mg total) by mouth daily before breakfast. 30 capsule 1  . simvastatin (ZOCOR) 20 MG tablet Take 20 mg by mouth daily.    Marland Kitchen spironolactone (ALDACTONE) 25 MG tablet Take 25 mg by mouth daily.     . travoprost, benzalkonium, (TRAVATAN) 0.004 % ophthalmic solution Place 1 drop into both eyes at bedtime.    Marland Kitchen  Vitamin D, Ergocalciferol, (DRISDOL) 50000 UNITS CAPS Take 50,000 Units by mouth every 7 (seven) days.      No current facility-administered medications for this visit.    Allergies as of 01/30/2020 - Review Complete 11/22/2019  Allergen Reaction Noted  . Aspirin Other (See Comments) 11/14/2011    Family History  Problem Relation Age of Onset  . Heart disease Mother   . Hypertension Mother   . Kidney disease Mother   . Hypertension Father   . Stroke Maternal Grandmother   . Colon cancer Neg Hx   . Esophageal cancer Neg Hx   . Stomach cancer Neg Hx   . Pancreatic  cancer Neg Hx   . Liver disease Neg Hx     Social History   Socioeconomic History  . Marital status: Married    Spouse name: Sullivan Lone  . Number of children: 1  . Years of education: 47  . Highest education level: Not on file  Occupational History  . Occupation: Forensic scientist: [employer]    Employer: [employer]  Tobacco Use  . Smoking status: Former Smoker    Years: 15.00    Types: Cigarettes    Quit date: 12/19/1992    Years since quitting: 27.1  . Smokeless tobacco: Never Used  Substance and Sexual Activity  . Alcohol use: Yes    Comment: seldom  . Drug use: Never  . Sexual activity: Not on file  Other Topics Concern  . Not on file  Social History Narrative   Lives with husband   Caffeine use: 1 caffeine drink daily    Left handed    Social Determinants of Health   Financial Resource Strain:   . Difficulty of Paying Living Expenses: Not on file  Food Insecurity:   . Worried About Programme researcher, broadcasting/film/video in the Last Year: Not on file  . Ran Out of Food in the Last Year: Not on file  Transportation Needs:   . Lack of Transportation (Medical): Not on file  . Lack of Transportation (Non-Medical): Not on file  Physical Activity:   . Days of Exercise per Week: Not on file  . Minutes of Exercise per Session: Not on file  Stress:   . Feeling of Stress : Not on file  Social Connections:   . Frequency of Communication with Friends and Family: Not on file  . Frequency of Social Gatherings with Friends and Family: Not on file  . Attends Religious Services: Not on file  . Active Member of Clubs or Organizations: Not on file  . Attends Banker Meetings: Not on file  . Marital Status: Not on file  Intimate Partner Violence:   . Fear of Current or Ex-Partner: Not on file  . Emotionally Abused: Not on file  . Physically Abused: Not on file  . Sexually Abused: Not on file    Review of Systems:    Constitutional: No weight loss, fever or  chills Cardiovascular: No chest pain Respiratory: No SOB Gastrointestinal: See HPI and otherwise negative   Physical Exam:  Vital signs: BP (!) 160/74 (Patient Position: Sitting, Cuff Size: Normal)   Pulse 76   Temp 98.5 F (36.9 C)   Ht 5\' 4"  (1.626 m) Comment: height measured without shoes  Wt 128 lb (58.1 kg)   BMI 21.97 kg/m   Constitutional:   Pleasant AA female appears to be in NAD, Well developed, Well nourished, alert and cooperative Respiratory: Respirations even and unlabored.  Lungs clear to auscultation bilaterally.   No wheezes, crackles, or rhonchi.  Cardiovascular: Normal S1, S2. No MRG. Regular rate and rhythm. No peripheral edema, cyanosis or pallor.  Gastrointestinal:  Soft, nondistended, nontender. No rebound or guarding. Normal bowel sounds. No appreciable masses or hepatomegaly. Rectal:  Not performed.  Psychiatric: Demonstrates good judgement and reason without abnormal affect or behaviors.  No recent labs or imaging.  Assessment: 1.  Epigastric fullness: Describes that after eating fried food she feels a fullness in her epigastrium which takes a couple of hours to go away, also with some excess eructations; consider lingering gastritis likely related to diet  Plan: 1.  Reviewed antireflux diet and lifestyle modifications. 2.  Suggested the patient try Famotidine 20 mg as needed when she gets this feeling to see if it helps.  Prescribed #30 with 1 refill. 3.  Continue Nexium 40 mg daily 4.  If this sensation continues and symptoms are not completely resolved would recommend an EGD for further eval. 5.  Patient to follow in clinic with me in 6 weeks or sooner if necessary.  Julia Meeker, PA-C [facility] 01/30/2020, 10:53 AM  Cc: Tally Joe, MD

## 2020-01-30 NOTE — Patient Instructions (Signed)
If you are age 64 or older, your body mass index should be between 23-30. Your Body mass index is 21.97 kg/m. If this is out of the aforementioned range listed, please consider follow up with your Primary Care Provider.  If you are age 88 or younger, your body mass index should be between 19-25. Your Body mass index is 21.97 kg/m. If this is out of the aformentioned range listed, please consider follow up with your Primary Care Provider.   Please continue Nexium.  Start Pepcid (famotidine) 20 mg one daily as needed.   It was a pleasure to see you today!  Hyacinth Meeker, PA-C

## 2020-02-05 NOTE — Progress Notes (Signed)
Addendum: Reviewed and agree with assessment and management plan. Annais Crafts M, MD  

## 2020-02-18 MED FILL — AMLODIPINE 2.5 MG TABLET: 2.5 | 90 days supply | Qty: 90 | Fill #0

## 2020-02-18 MED FILL — ESOMEPRAZOLE MAG DR 40 MG C: 40 | 30 days supply | Qty: 30 | Fill #1

## 2020-02-18 MED FILL — VIT D2 1.25 MG (50,000 UNIT: 1.25 MG | 90 days supply | Qty: 13 | Fill #1

## 2020-02-18 MED FILL — SPIRONOLACTONE 25 MG TABS: 25 | 90 days supply | Qty: 90 | Fill #0

## 2020-03-12 ENCOUNTER — Ambulatory Visit: Payer: Self-pay | Attending: Internal Medicine

## 2020-03-12 DIAGNOSIS — Z23 Encounter for immunization: Secondary | ICD-10-CM

## 2020-03-12 NOTE — Progress Notes (Signed)
   Covid-19 Vaccination Clinic  Name:  JACQULIN BRANDENBURGER    MRN: [id number] DOB: 07-30-1956  03/12/2020  Ms. Kattner was observed post Covid-19 immunization for 15 minutes without incident. She was provided with Vaccine Information Sheet and instruction to access the V-Safe system.   Ms. Obyrne was instructed to call 911 with any severe reactions post vaccine: Marland Kitchen Difficulty breathing  . Swelling of face and throat  . A fast heartbeat  . A bad rash all over body  . Dizziness and weakness   Immunizations Administered    Name Date Dose VIS Date Route   Pfizer COVID-19 Vaccine 03/12/2020  4:28 PM 0.3 mL 11/29/2019 Intramuscular   Manufacturer: [employer], [employer]   Lot: [id number]   NDC: [address]-3

## 2020-03-26 IMAGING — US US ABDOMEN LIMITED
1 series · 14 of 25 positions shown · non-contrast
Comparison: Abdominal ultrasound 3588

CLINICAL DATA: Right upper quadrant pain

EXAM:
ULTRASOUND ABDOMEN LIMITED RIGHT UPPER QUADRANT

[Series 1: us abdomen limited · 0.19mm/px · 14 of 58 slices shown]
[im 1/58]
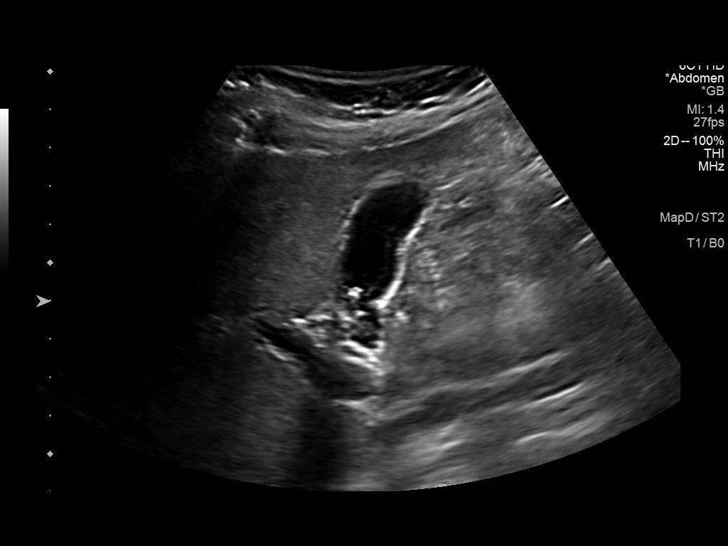
[im 5/58]
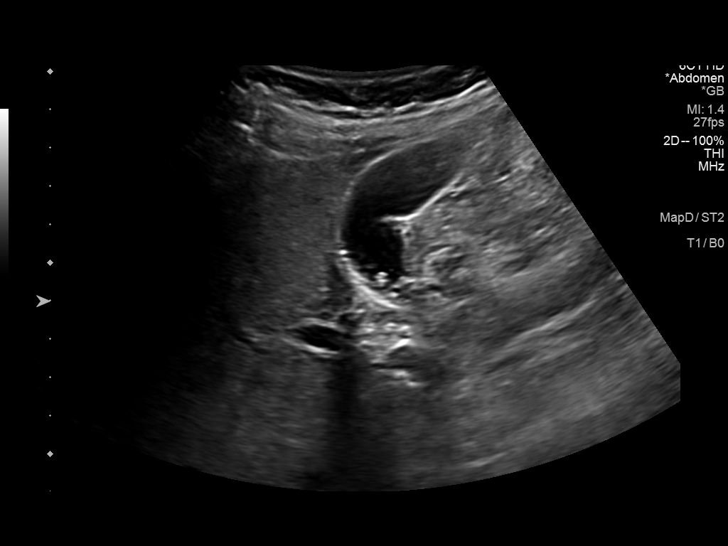
[im 10/58]
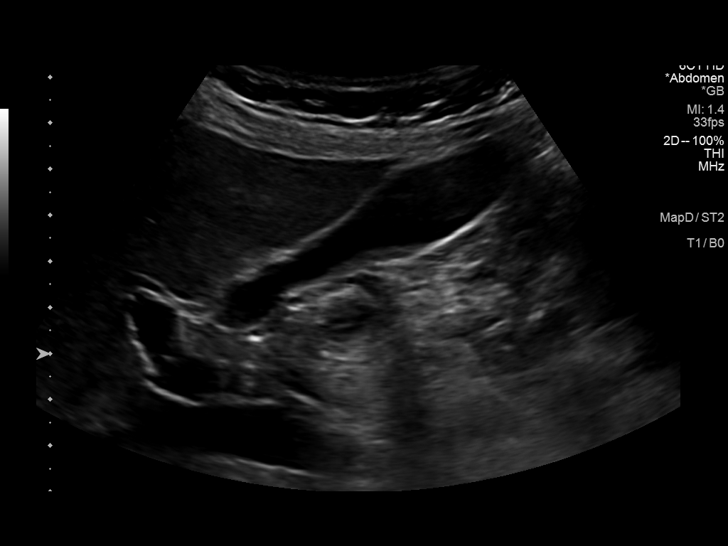
[im 15/58]
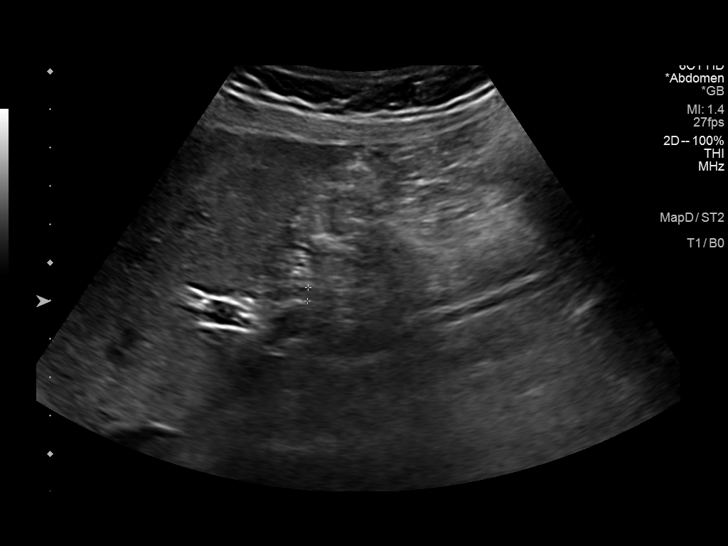
[im 20/58]
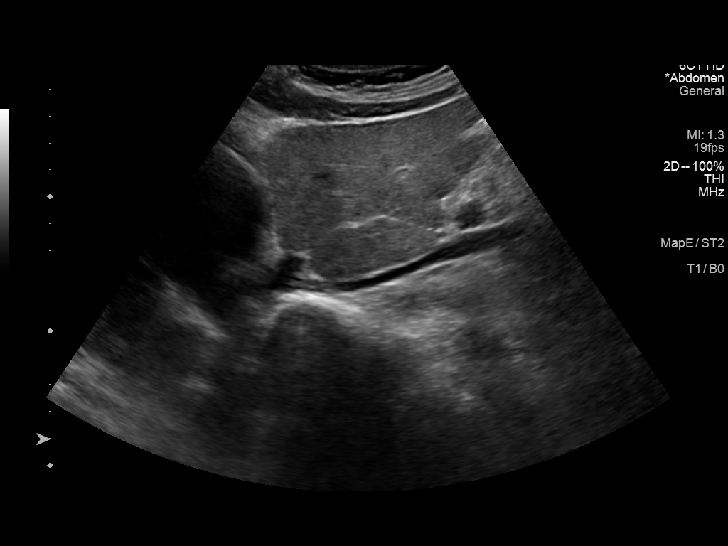
[im 22/58]
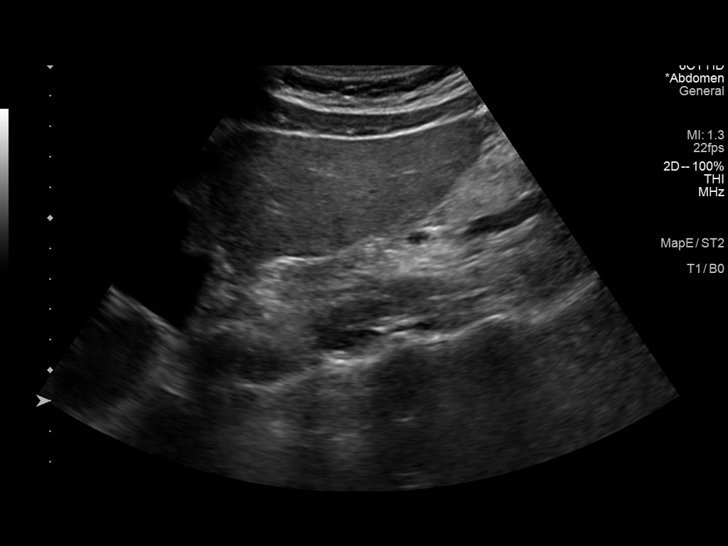
[im 27/58]
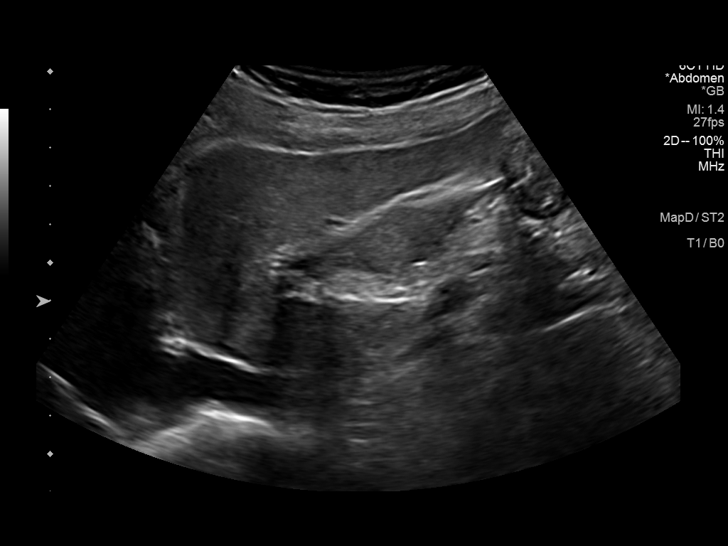
[im 31/58]
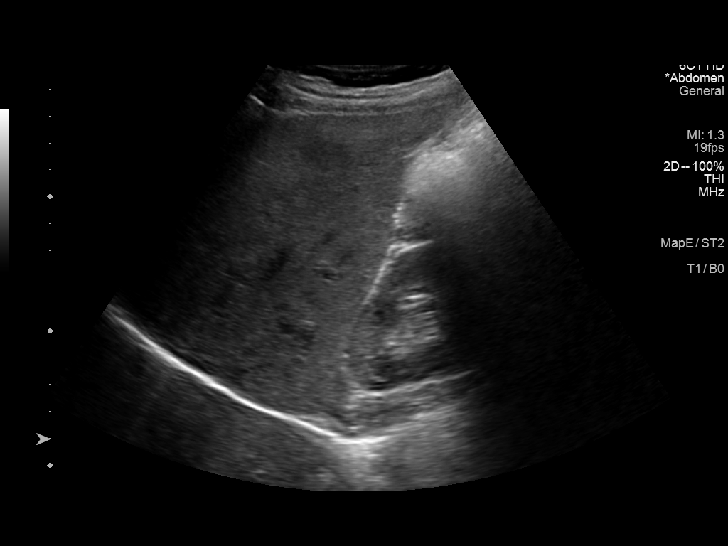
[im 36/58]
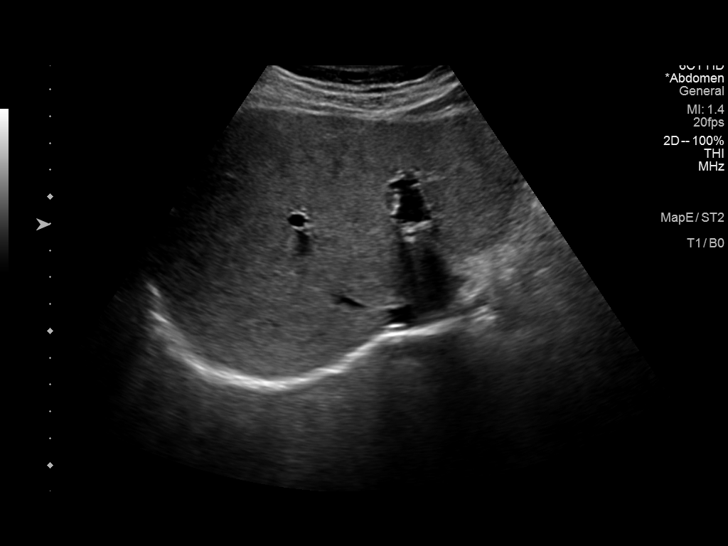
[im 39/58]
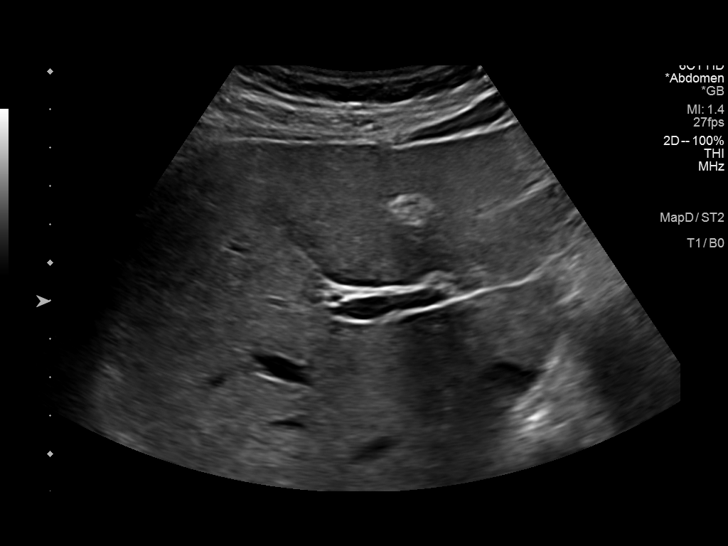
[im 43/58]
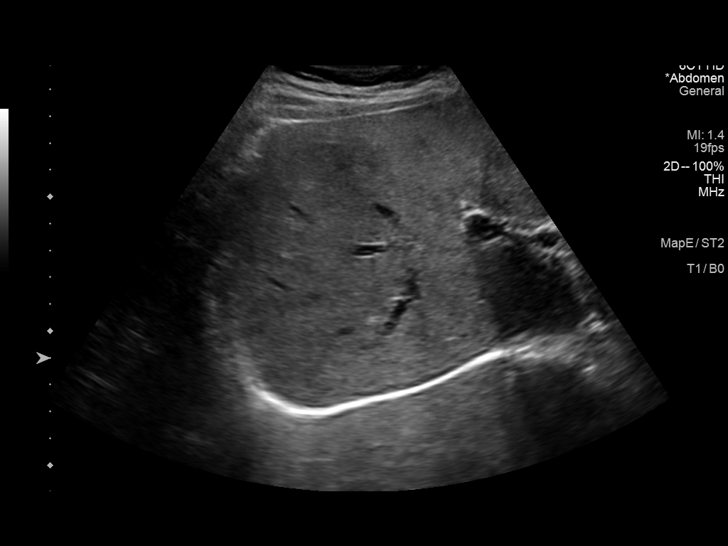
[im 48/58]
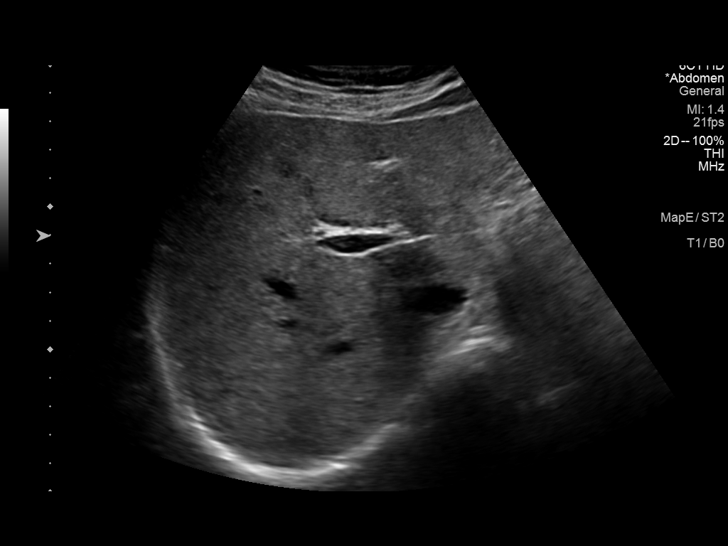
[im 53/58]
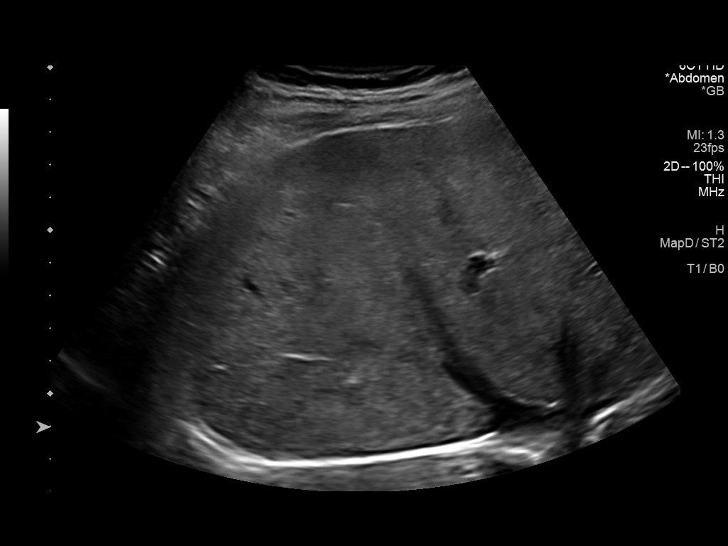
[im 58/58]
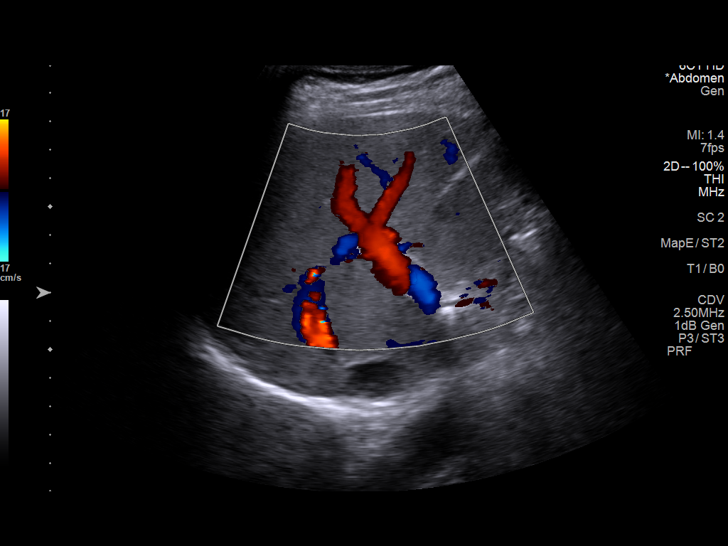

[14 of 25 positions shown; findings below may reference images not displayed]

FINDINGS: Gallbladder:

No gallstones or wall thickening visualized. There are a few small
non mobile echogenic foci along the gallbladder wall measuring up to
5 mm which likely represent polyps. No sonographic Murphy sign noted
by sonographer.

Common bile duct:

Diameter: 0.4 cm

Liver:

No focal lesion identified. Within normal limits in parenchymal
echogenicity. Slightly prominent caudate lobe. Portal vein is patent
on color Doppler imaging with normal direction of blood flow towards
the liver.

Other: None.
IMPRESSION: 1. Several small gallbladder polyps measuring 5 mm or less, are
considered benign. No dedicated follow-up necessary.

2. Slightly prominent caudate lobe of the liver. This is a
nonspecific sonographic finding but can be seen in the setting of
early cirrhosis.

## 2020-04-08 ENCOUNTER — Ambulatory Visit: Payer: Self-pay | Attending: Internal Medicine

## 2020-04-08 DIAGNOSIS — Z23 Encounter for immunization: Secondary | ICD-10-CM

## 2020-04-08 NOTE — Progress Notes (Signed)
   Covid-19 Vaccination Clinic  Name:  Julia Kim    MRN: [id number] DOB: 1956-06-19  04/08/2020  Julia Kim was observed post Covid-19 immunization for 15 minutes without incident. She was provided with Vaccine Information Sheet and instruction to access the V-Safe system.   Julia Kim was instructed to call 911 with any severe reactions post vaccine: Marland Kitchen Difficulty breathing  . Swelling of face and throat  . A fast heartbeat  . A bad rash all over body  . Dizziness and weakness   Immunizations Administered    Name Date Dose VIS Date Route   Pfizer COVID-19 Vaccine 04/08/2020 10:50 AM 0.3 mL 02/12/2019 Intramuscular   Manufacturer: [employer], [employer]   Lot: [id number]   NDC: [address]-7

## 2020-04-10 DIAGNOSIS — H18413 Arcus senilis, bilateral: Secondary | ICD-10-CM | POA: Diagnosis not present

## 2020-04-10 DIAGNOSIS — H2513 Age-related nuclear cataract, bilateral: Secondary | ICD-10-CM | POA: Diagnosis not present

## 2020-04-10 DIAGNOSIS — H401131 Primary open-angle glaucoma, bilateral, mild stage: Secondary | ICD-10-CM | POA: Diagnosis not present

## 2020-04-10 MED FILL — SIMBRINZA 1-0.2 % SUSP: 1-0.2 | 80 days supply | Qty: 16 | Fill #0

## 2020-04-10 MED FILL — TRAVOPROST (BAK FREE) 0.004: 0.004 | 75 days supply | Qty: 8 | Fill #0

## 2020-05-19 ENCOUNTER — Telehealth: Payer: Self-pay

## 2020-05-19 DIAGNOSIS — K824 Cholesterolosis of gallbladder: Secondary | ICD-10-CM

## 2020-05-19 DIAGNOSIS — R932 Abnormal findings on diagnostic imaging of liver and biliary tract: Secondary | ICD-10-CM

## 2020-05-19 NOTE — Telephone Encounter (Signed)
-----   Message from Annett Fabian, RN sent at 05/19/2020  2:11 PM EDT -----  ----- Message ----- From: Antony Blackbird, RN Sent: 05/19/2020 To: Antony Blackbird, RN  Needs US-RUQ w/elastography in 6 months = 05/28/20 (per Hyacinth Meeker PA)

## 2020-05-19 NOTE — Telephone Encounter (Signed)
[facility] [place] elastography on 6/9//21 at 9 am arrive at 845 am NPO after midnight   The patient has been notified of this information and all questions answered.

## 2020-05-27 ENCOUNTER — Other Ambulatory Visit: Payer: Self-pay

## 2020-05-27 ENCOUNTER — Ambulatory Visit (HOSPITAL_COMMUNITY)
Admission: RE | Admit: 2020-05-27 | Discharge: 2020-05-27 | Disposition: A | Discharge status: Home / Self Care | Payer: 59 | Source: Ambulatory Visit | Attending: Physician Assistant | Admitting: Physician Assistant

## 2020-05-27 DIAGNOSIS — K824 Cholesterolosis of gallbladder: Secondary | ICD-10-CM | POA: Insufficient documentation

## 2020-05-27 DIAGNOSIS — K760 Fatty (change of) liver, not elsewhere classified: Secondary | ICD-10-CM | POA: Diagnosis not present

## 2020-05-27 DIAGNOSIS — R932 Abnormal findings on diagnostic imaging of liver and biliary tract: Secondary | ICD-10-CM | POA: Diagnosis not present

## 2020-07-28 ENCOUNTER — Other Ambulatory Visit (HOSPITAL_COMMUNITY): Payer: Self-pay | Admitting: Family Medicine

## 2020-07-28 DIAGNOSIS — K219 Gastro-esophageal reflux disease without esophagitis: Secondary | ICD-10-CM | POA: Diagnosis not present

## 2020-07-28 DIAGNOSIS — Z1211 Encounter for screening for malignant neoplasm of colon: Secondary | ICD-10-CM | POA: Diagnosis not present

## 2020-07-28 DIAGNOSIS — R232 Flushing: Secondary | ICD-10-CM | POA: Diagnosis not present

## 2020-07-28 DIAGNOSIS — Z0000 Encounter for general adult medical examination without abnormal findings: Secondary | ICD-10-CM | POA: Diagnosis not present

## 2020-07-28 DIAGNOSIS — E782 Mixed hyperlipidemia: Secondary | ICD-10-CM | POA: Diagnosis not present

## 2020-07-28 DIAGNOSIS — J302 Other seasonal allergic rhinitis: Secondary | ICD-10-CM | POA: Diagnosis not present

## 2020-07-28 DIAGNOSIS — E559 Vitamin D deficiency, unspecified: Secondary | ICD-10-CM | POA: Diagnosis not present

## 2020-07-28 DIAGNOSIS — I10 Essential (primary) hypertension: Secondary | ICD-10-CM | POA: Diagnosis not present

## 2020-07-28 DIAGNOSIS — L932 Other local lupus erythematosus: Secondary | ICD-10-CM | POA: Diagnosis not present

## 2020-07-28 MED FILL — OMEPRAZOLE 40 MG CPDR: 40 | 90 days supply | Qty: 90 | Fill #0

## 2020-09-16 MED FILL — SPIRONOLACTONE 25 MG TABS: 25 | 90 days supply | Qty: 90 | Fill #0

## 2020-09-16 MED FILL — AMLODIPINE BESYLATE 2.5 MG: 2.5 | 90 days supply | Qty: 90 | Fill #0

## 2020-11-03 ENCOUNTER — Other Ambulatory Visit: Payer: Self-pay | Admitting: Family Medicine

## 2020-11-03 DIAGNOSIS — Z1231 Encounter for screening mammogram for malignant neoplasm of breast: Secondary | ICD-10-CM

## 2020-11-07 IMAGING — US US ABDOMEN LIMITED W/ ELASTOGRAPHY
2 series · 12 of 25 positions shown · non-contrast
Comparison: Right upper quadrant ultrasound 10/14/2019

CLINICAL DATA: Follow-up abnormal ultrasound of the liver.

EXAM:
US ABDOMEN LIMITED - RIGHT UPPER QUADRANT
ULTRASOUND HEPATIC ELASTOGRAPHY
TECHNIQUE: Sonography of the right upper quadrant was performed. In addition,
ultrasound elastography evaluation of the liver was performed. A
region of interest was placed within the right lobe of the liver.
Following application of a compressive sonographic pulse, tissue
compressibility was assessed. Multiple assessments were performed at
the selected site. Median tissue compressibility was determined.
Previously, hepatic stiffness was assessed by shear wave velocity.
Based on recently published Society of Radiologists in Ultrasound
consensus article, reporting is now recommended to be performed in
the SI units of pressure (kiloPascals) representing hepatic
stiffness/elasticity. The obtained result is compared to the
published reference standards. (cACLD = compensated Advanced Chronic
Liver Disease)

[Series 1: us abdomen limited w/ elastography · 11 of 95 slices shown (1 of 2)]
[im 5/95]
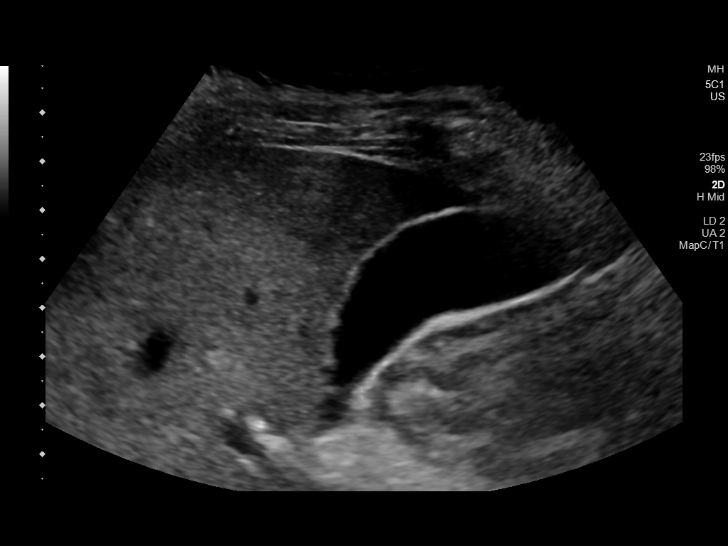
[im 14/95]
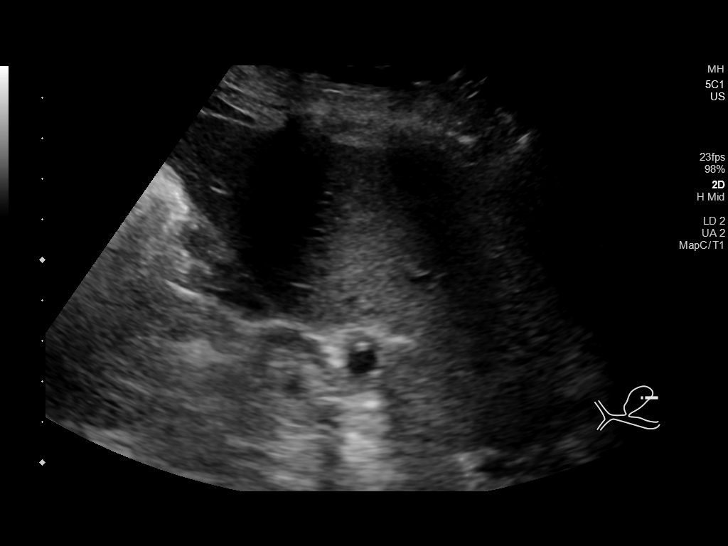
[im 23/95]
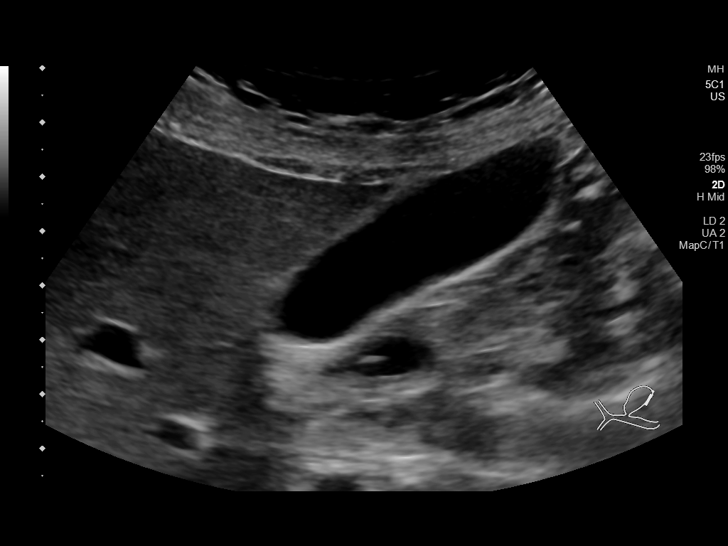
[im 32/95]
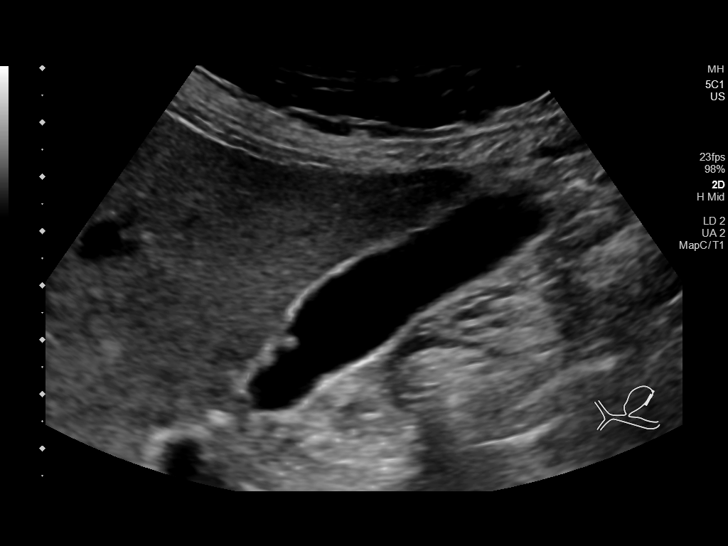
[im 41/95]
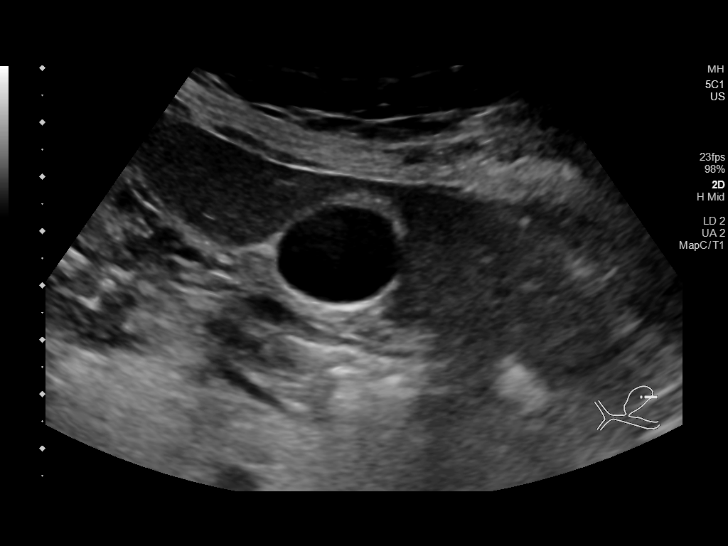
[im 50/95]
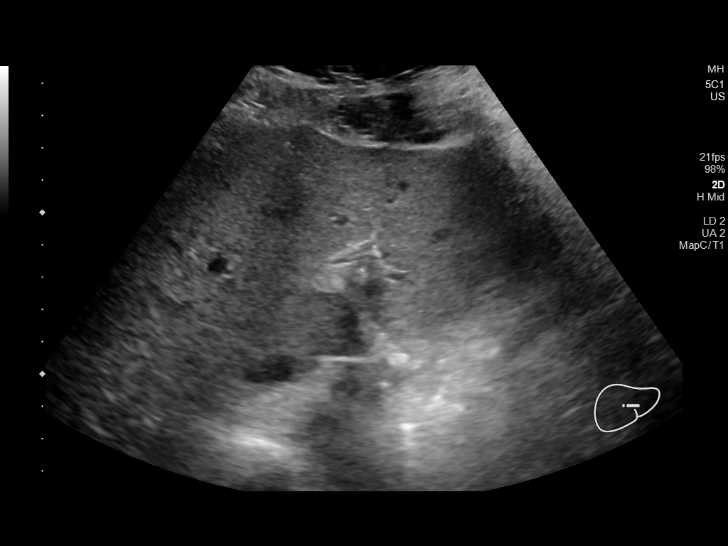
[im 59/95]
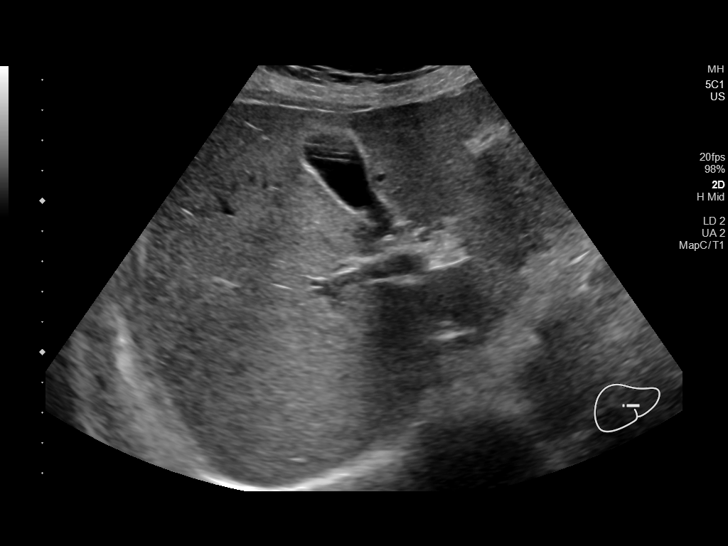
[im 68/95]
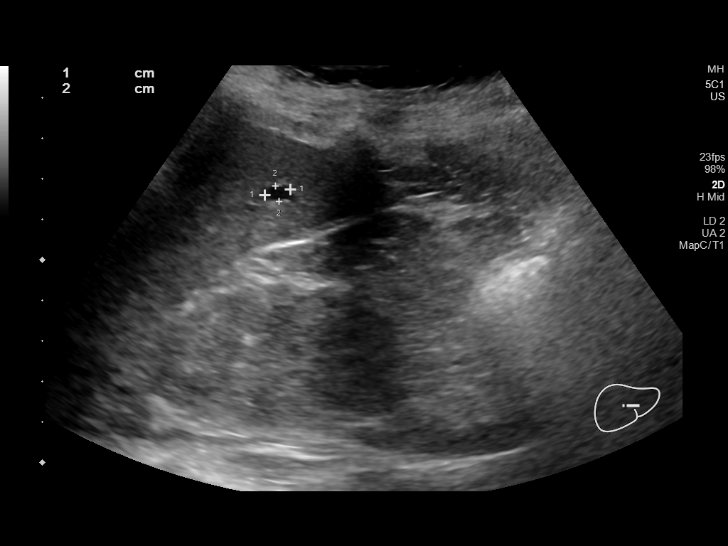
[im 77/95]
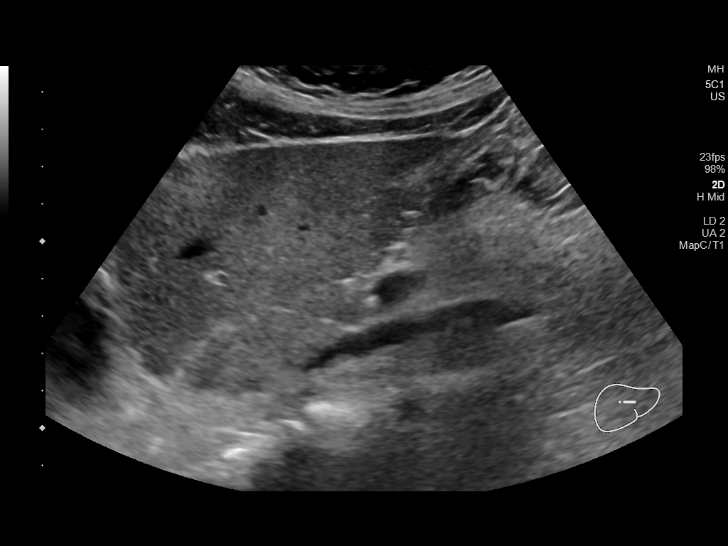
[im 86/95]
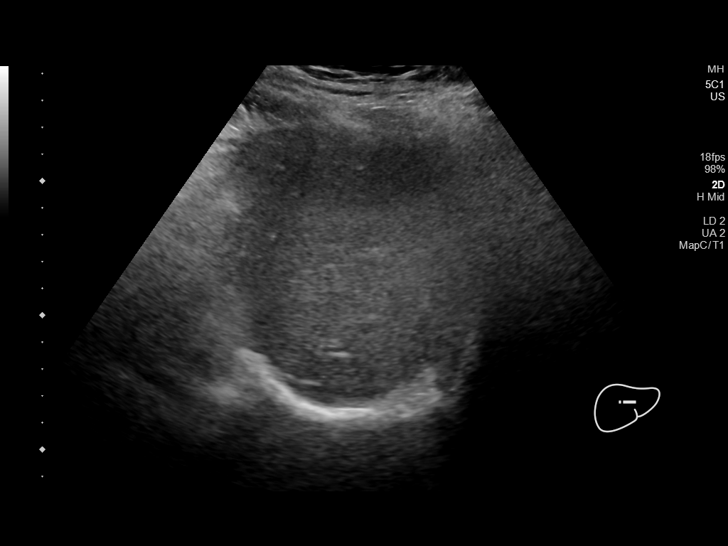
[im 95/95]
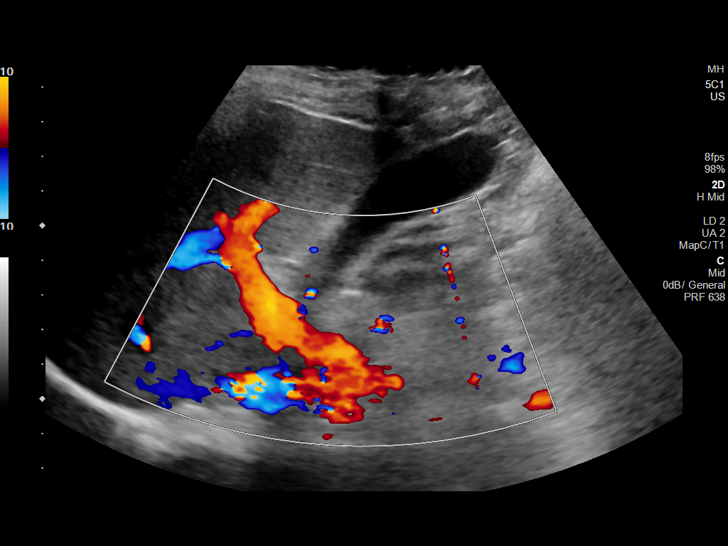

[Series 2: us abdomen limited w/ elastography · 1 of 14 slices shown (2 of 2)]
[im 7/14]
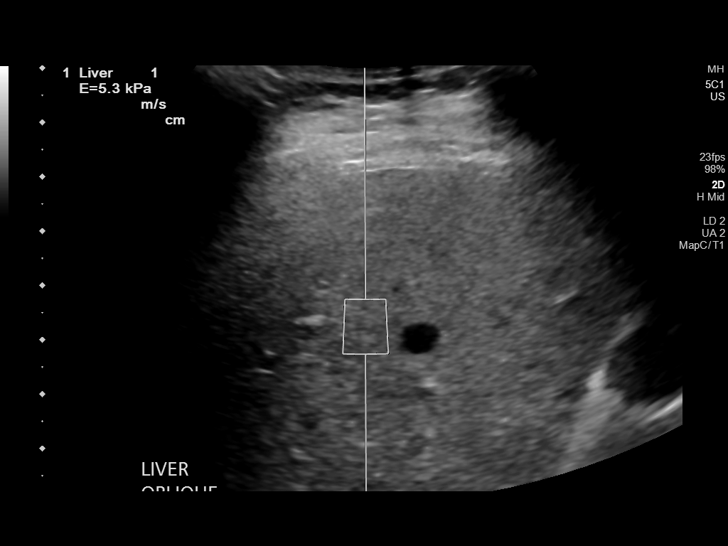

[12 of 25 positions shown; findings below may reference images not displayed]

FINDINGS: ULTRASOUND ABDOMEN LIMITED RIGHT UPPER QUADRANT

Gallbladder:

No gallstones or wall thickening visualized. No sonographic Murphy
sign noted. Gallbladder polyp noted measuring 3.6 mm, unchanged.

Common bile duct:

Diameter: 4.6 mm

Liver:

Increased parenchymal echogenicity. Anechoic cyst within right
hepatic lobe measures 7 mm. Small hypoechoic structure within left
hepatic lobe measures 1.0 x 0.9 x 1.2 cm and is favored to represent
an area of focal fatty sparing. The not seen previously. Portal vein
is patent on color Doppler imaging with normal direction of blood
flow towards the liver.

ULTRASOUND HEPATIC ELASTOGRAPHY

Device: Siemens Helix VTQ

Patient position: Oblique

Transducer [id number]

Number of measurements: 10

Hepatic segment:  8

Median kPa:

IQR:

IQR/Median kPa ratio:

Data quality:  Good

Diagnostic category: < or = 9 kPa: in the absence of other known
clinical signs, rules out cACLD
IMPRESSION: ULTRASOUND RUQ:

1. Hepatic steatosis.
2. Stable gallbladder polyp measuring 5 mm.
3. Focal low-attenuation structure in left hepatic lobe is favored
to represent an area of focal fatty sparing. In the absence of a
known malignancy recommend follow-up ultrasound in 6 months. If
there is a known cancer history then further investigation with
contrast enhanced liver protocol MRI is advised.

ULTRASOUND HEPATIC ELASTOGRAPHY:

Median kPa:

Diagnostic category: < or = 9 kPa: in the absence of other known
clinical signs, rules out cACLD

The use of hepatic elastography is applicable to patients with viral
hepatitis and non-alcoholic fatty liver disease. At this time, there
is insufficient data for the referenced cut-off values and use in
other causes of liver disease, including alcoholic liver disease.
Patients, however, may be assessed by elastography and serve as
their own reference standard/baseline.

In patients with non-alcoholic liver disease, the values suggesting
compensated advanced chronic liver disease (cACLD) may be lower, and
patients may need additional testing with elasticity results of [DATE]
kPa.

Please note that abnormal hepatic elasticity and shear wave
velocities may also be identified in clinical settings other than
with hepatic fibrosis, such as: acute hepatitis, elevated right
heart and central venous pressures including use of beta blockers,
[place] disease ([place]), infiltrative processes such as
mastocytosis/amyloidosis/infiltrative tumor/lymphoma, extrahepatic
cholestasis, with hyperemia in the post-prandial state, and with
liver transplantation. Correlation with patient history, laboratory
data, and clinical condition recommended.

Diagnostic Categories:

< or =5 kPa: high probability of being normal

< or =9 kPa: in the absence of other known clinical signs, rules [DATE] kPa and ?13 kPa: suggestive of cACLD, but needs further testing

>13 kPa: highly suggestive of cACLD

> or =17 kPa: highly suggestive of cACLD with an increased
probability of clinically significant portal hypertension

## 2020-11-30 DIAGNOSIS — Z23 Encounter for immunization: Secondary | ICD-10-CM | POA: Diagnosis not present

## 2020-12-15 ENCOUNTER — Ambulatory Visit
Admission: RE | Admit: 2020-12-15 | Discharge: 2020-12-15 | Disposition: A | Discharge status: Home / Self Care | Payer: 59 | Source: Ambulatory Visit | Attending: Family Medicine | Admitting: Family Medicine

## 2020-12-15 ENCOUNTER — Other Ambulatory Visit: Payer: Self-pay

## 2020-12-15 DIAGNOSIS — Z1231 Encounter for screening mammogram for malignant neoplasm of breast: Secondary | ICD-10-CM | POA: Diagnosis not present

## 2020-12-29 MED FILL — AMLODIPINE BESYLATE 2.5 MG: 2.5 | 90 days supply | Qty: 90 | Fill #1

## 2020-12-29 MED FILL — SPIRONOLACTONE 25 MG TABS: 25 | 90 days supply | Qty: 90 | Fill #1

## 2020-12-29 MED FILL — VIT D2 1.25 MG (50,000 UNIT: 1.25 MG | 84 days supply | Qty: 12 | Fill #0

## 2020-12-30 ENCOUNTER — Other Ambulatory Visit (HOSPITAL_COMMUNITY): Payer: Self-pay | Admitting: Family Medicine

## 2021-01-28 ENCOUNTER — Other Ambulatory Visit (HOSPITAL_COMMUNITY): Payer: Self-pay | Admitting: Family Medicine

## 2021-01-28 DIAGNOSIS — M25511 Pain in right shoulder: Secondary | ICD-10-CM | POA: Diagnosis not present

## 2021-01-28 DIAGNOSIS — E782 Mixed hyperlipidemia: Secondary | ICD-10-CM | POA: Diagnosis not present

## 2021-01-28 DIAGNOSIS — L932 Other local lupus erythematosus: Secondary | ICD-10-CM | POA: Diagnosis not present

## 2021-01-28 DIAGNOSIS — E559 Vitamin D deficiency, unspecified: Secondary | ICD-10-CM | POA: Diagnosis not present

## 2021-01-28 DIAGNOSIS — I10 Essential (primary) hypertension: Secondary | ICD-10-CM | POA: Diagnosis not present

## 2021-01-28 DIAGNOSIS — J302 Other seasonal allergic rhinitis: Secondary | ICD-10-CM | POA: Diagnosis not present

## 2021-01-28 DIAGNOSIS — K219 Gastro-esophageal reflux disease without esophagitis: Secondary | ICD-10-CM | POA: Diagnosis not present

## 2021-01-28 DIAGNOSIS — R232 Flushing: Secondary | ICD-10-CM | POA: Diagnosis not present

## 2021-01-28 MED FILL — OMEPRAZOLE 40 MG CPDR: 40 | 90 days supply | Qty: 90 | Fill #0

## 2021-02-05 ENCOUNTER — Other Ambulatory Visit: Payer: Self-pay | Admitting: Family Medicine

## 2021-02-05 ENCOUNTER — Other Ambulatory Visit: Payer: Self-pay

## 2021-02-05 ENCOUNTER — Other Ambulatory Visit (HOSPITAL_COMMUNITY): Payer: Self-pay | Admitting: Family Medicine

## 2021-02-05 ENCOUNTER — Ambulatory Visit
Admission: RE | Admit: 2021-02-05 | Discharge: 2021-02-05 | Disposition: A | Discharge status: Home / Self Care | Payer: 59 | Source: Ambulatory Visit | Attending: Family Medicine | Admitting: Family Medicine

## 2021-02-05 DIAGNOSIS — M79672 Pain in left foot: Secondary | ICD-10-CM | POA: Diagnosis not present

## 2021-02-05 DIAGNOSIS — M7989 Other specified soft tissue disorders: Secondary | ICD-10-CM | POA: Diagnosis not present

## 2021-02-05 MED FILL — MELOXICAM 15 MG TABLET: 15 | 30 days supply | Qty: 30 | Fill #0

## 2021-02-17 DIAGNOSIS — M67911 Unspecified disorder of synovium and tendon, right shoulder: Secondary | ICD-10-CM | POA: Diagnosis not present

## 2021-02-25 DIAGNOSIS — Z23 Encounter for immunization: Secondary | ICD-10-CM | POA: Diagnosis not present

## 2021-03-01 ENCOUNTER — Encounter: Payer: Self-pay | Admitting: Physical Therapy

## 2021-03-01 ENCOUNTER — Ambulatory Visit: Payer: 59 | Attending: Orthopedic Surgery | Admitting: Physical Therapy

## 2021-03-01 ENCOUNTER — Other Ambulatory Visit: Payer: Self-pay

## 2021-03-01 DIAGNOSIS — M6281 Muscle weakness (generalized): Secondary | ICD-10-CM | POA: Insufficient documentation

## 2021-03-01 DIAGNOSIS — M25612 Stiffness of left shoulder, not elsewhere classified: Secondary | ICD-10-CM | POA: Insufficient documentation

## 2021-03-01 DIAGNOSIS — M25512 Pain in left shoulder: Secondary | ICD-10-CM | POA: Insufficient documentation

## 2021-03-01 DIAGNOSIS — M25511 Pain in right shoulder: Secondary | ICD-10-CM | POA: Diagnosis not present

## 2021-03-01 DIAGNOSIS — M542 Cervicalgia: Secondary | ICD-10-CM | POA: Diagnosis not present

## 2021-03-01 NOTE — Therapy (Signed)
[facility] Outpatient Rehabilitation [facility] [address] [place], [place], [address] Phone: [phone]   Fax:  [phone]  Physical Therapy Evaluation  Patient Details  Name: Julia Kim MRN: [id number] Date of Birth: 10-27-1956 Referring Provider (PT): Dr Jones Broom   Encounter Date: 03/01/2021   PT End of Session - 03/01/21 0857    Visit Number 1    Number of Visits 12    Date for PT Re-Evaluation 04/12/21    PT Start Time 0845    PT Stop Time 0928    PT Time Calculation (min) 43 min    Activity Tolerance Patient tolerated treatment well    Behavior During Therapy [facility] for tasks assessed/performed           Past Medical History:  Diagnosis Date  . Allergic sinusitis   . 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 Full dentures   . GERD (gastroesophageal reflux disease)   . Glaucoma, both eyes   . Hiatal hernia   . Hyperlipidemia   . Hyperlipidemia   . Hypertension   . Partial tear of left rotator cuff   . Vitamin D deficiency   . Wears glasses     Past Surgical History:  Procedure Laterality Date  . CESAREAN SECTION  1975  . COLONOSCOPY    . FOOT SURGERY Right 2002  . SHOULDER ARTHROSCOPY WITH OPEN ROTATOR CUFF REPAIR Left 06/17/2019   Procedure: SHOULDER ARTHROSCOPY WITH OPEN ROTATOR CUFF REPAIR, SUBACROMIAL DECOMPRESSION;  Surgeon: Jones Broom, MD;  Location: [facility];  Service: Orthopedics;  Laterality: Left;  . TRIGGER FINGER RELEASE Right 09/15/2017   Procedure: RELEASE TRIGGER FINGER/A-1 PULLEY RIGHT THUMB;  Surgeon: Cindee Salt, MD;  Location: [facility];  Service: Orthopedics;  Laterality: Right;  Bier block  . ULNAR NERVE TRANSPOSITION Right 09/20/2013   Procedure: RIGHT ULNAR NERVE DECOMPRESSION;  Surgeon: Nicki Reaper, MD;  Location: [facility];  Service: Orthopedics;  Laterality: Right;  . UPPER GASTROINTESTINAL ENDOSCOPY    . UPPER GI ENDOSCOPY      There were no vitals  filed for this visit.    Subjective Assessment - 03/01/21 0849    Subjective Patient reports an  onset of right shoulder pain about 3 monts ago. She has the most pain when she is lifting things. She had a shot in the shoulder. Since the shot she has had less pain. She had a left RTC repai in 2019.    Pertinent History left RTC surgery    Limitations Lifting    How long can you sit comfortably? N/A    How long can you stand comfortably? N/A    How long can you walk comfortably? N/A    Diagnostic tests had an x-ray    Patient Stated Goals to have less pain in the shoulder    Currently in Pain? Yes    Pain Score 3     Pain Location Shoulder    Pain Orientation Right    Pain Descriptors / Indicators Aching    Pain Type Acute pain    Pain Radiating Towards into the deltoid    Pain Onset More than a month ago    Pain Frequency Constant    Aggravating Factors  use of the shoulder and reaching back    Pain Relieving Factors cortisone shot    Effect of Pain on Daily Activities nothing              [facility] PT Assessment -  03/01/21 0001      Assessment   Medical Diagnosis Right Shoulder Pain    Referring Provider (PT) Dr Jill Alexanders chandler    Onset Date/Surgical Date --   3 months prior   Hand Dominance Left    Next MD Visit 03/17/2021    Prior Therapy For left shoulder      Precautions   Precautions None      Restrictions   Weight Bearing Restrictions No      Balance Screen   Has the patient fallen in the past 6 months No    Has the patient had a decrease in activity level because of a fear of falling?  No    Is the patient reluctant to leave their home because of a fear of falling?  No      Home Environment   Additional Comments nothing significant      Prior Function   Level of Independence Independent    Vocation Retired    Leisure not really. is able to do things just has pain      Cognition   Overall Cognitive Status Within Functional Limits for tasks assessed     Attention Focused    Focused Attention Appears intact    Memory Appears intact    Awareness Appears intact    Problem Solving Appears intact      Sensation   Light Touch Appears Intact      Coordination   Gross Motor Movements are Fluid and Coordinated Yes    Fine Motor Movements are Fluid and Coordinated Yes      ROM / Strength   AROM / PROM / Strength AROM;PROM;Strength      AROM   Overall AROM Comments mild pain with end range shoulder flexion and with end range functional external rotation      PROM   PROM Assessment Site Shoulder    Right/Left Shoulder Right      Strength   Strength Assessment Site Shoulder    Right/Left Shoulder Right    Right Shoulder Internal Rotation 5/5    Right Shoulder External Rotation 4+/5    Right Shoulder Horizontal ABduction 4+/5      Palpation   Palpation comment No TTP found today      Special Tests   Other special tests (-) [place]                      Objective measurements completed on examination: See above findings.       [facility] Adult PT Treatment/Exercise - 03/01/21 0001      Shoulder Exercises: Supine   Other Supine Exercises wand flexion and ER 2x5 with cuing for technique. Dosnet have to do now but to do if she statrts to get sore      Shoulder Exercises: Standing   External Rotation Limitations red x10    Internal Rotation Limitations red x10    Extension Limitations red x10    Row Limitations red x10                  PT Education - 03/01/21 1424    Education Details reviewed sy,ptom mangement and stabilization exercises.    Person(s) Educated Patient    Methods Explanation;Demonstration;Tactile cues;Verbal cues    Comprehension Returned demonstration;Verbal cues required;Tactile cues required;Verbalized understanding            PT Short Term Goals - 03/01/21 0958      PT SHORT TERM GOAL #1  Title STG=LTG             PT Long Term Goals - 03/01/21 0958      PT LONG TERM GOAL  #1   Title Patient will demonstrate 5/5 gorss sttrengthen in order to perfrom ADL's    Time 6    Period Weeks    Status New    Target Date 04/12/21      PT LONG TERM GOAL #2   Title Patient will be reach behind her in her car without pain    Time 6    Period Weeks    Status New    Target Date 04/12/21      PT LONG TERM GOAL #3   Title Patient will be independent with a stretching and strengthening to prevent further episodes of shoulder pain    Time 6    Period Weeks    Status New    Target Date 04/12/21      PT LONG TERM GOAL #4   Title Patient will reach overhead with a 2lb weight without pain in order to put the dishes away    Time 6    Period Weeks    Status New    Target Date 04/12/21                  Plan - 03/01/21 0936    Clinical Impression Statement Patient is a 39 year olf female with right shoulder pain. She has a history of left RTC repair. She had a shotr last wek which has eliminated her pain. She still has minor pain at time when she reaches. All special tests were negative. She had full ROM and minor strength differences. She would benefit from skilled therapy for s strengthening and range program to prevent pain from coming back. She was educated on symptom management.    Personal Factors and Comorbidities Comorbidity 2;Comorbidity 1    Comorbidities left shoulder RTC repair; right ulnar nerve transposition    Examination-Activity Limitations Lift;Reach Overhead;Carry    Examination-Participation Restrictions Cleaning;Laundry;Meal Prep;Shop    Stability/Clinical Decision Making Stable/Uncomplicated    Clinical Decision Making Low    Rehab Potential Excellent    PT Frequency 1x / week    PT Duration 6 weeks    PT Treatment/Interventions ADLs/Self Care Home Management;Electrical Stimulation;Cryotherapy;Iontophoresis 4mg /ml Dexamethasone;Ultrasound;Gait training;DME Instruction;Functional mobility training;Therapeutic exercise;Neuromuscular  re-education;Patient/family education;Manual techniques;Passive range of motion;Dry needling;Taping    PT Next Visit Plan if patient remains pain free consider standing shelf reach flexion and scpation; coisder closed chair strengthening; If patient is having pain consider manual therayp.    PT Home Exercise Plan t-band extesnion, row; IR and ER; shown wand flexion and er if her pain increaes    Consulted and Agree with Plan of Care Patient           Patient will benefit from skilled therapeutic intervention in order to improve the following deficits and impairments:  Pain,Impaired UE functional use,Decreased strength,Decreased mobility,Decreased activity tolerance,Decreased range of motion  Visit Diagnosis: Acute pain of right shoulder     Problem List Patient Active Problem List   Diagnosis Date Noted  . Polyneuropathy 03/20/2019  . Numbness 03/20/2019  . Gait disturbance 03/20/2019  . Impingement syndrome of right shoulder 05/16/2017  . Special screening for malignant neoplasms, colon 11/28/2011  . Diverticulosis of colon (without mention of hemorrhage) 11/28/2011  . Abdominal pain 11/15/2011  . Polyp of gallbladder 11/15/2011    11/17/2011 PT DPT  03/01/2021, 2:29  PM  [facility] [address] [place], [place], [address] Phone: [phone]   Fax:  [phone]  Name: Julia Kim MRN: [id number] Date of Birth: 02/01/1956

## 2021-03-01 NOTE — Patient Instructions (Signed)
Access Code: [email or web address]: [email or web address]: 03/14/2022Prepared by: Onalee Hua CarrollExercises  Shoulder extension with resistance - Neutral - 1 x daily - 7 x weekly - 3 sets - 10 reps  Shoulder External Rotation with Anchored Resistance - 1 x daily - 7 x weekly - 3 sets - 10 reps  Scapular Retraction with Resistance - 1 x daily - 7 x weekly - 3 sets - 10 reps  Standing Shoulder Internal Rotation with Anchored Resistance - 1 x daily - 7 x weekly - 3 sets - 10 reps  Supine Shoulder Flexion Extension AAROM with Dowel - 1 x daily - 7 x weekly - 3 sets - 10 reps  Supine Shoulder External Rotation in 45 Degrees Abduction AAROM with Dowel - 1 x daily - 7 x weekly - 3 sets - 10 reps

## 2021-03-09 ENCOUNTER — Other Ambulatory Visit: Payer: Self-pay

## 2021-03-09 ENCOUNTER — Encounter: Payer: Self-pay | Admitting: Physical Therapy

## 2021-03-09 ENCOUNTER — Ambulatory Visit: Payer: 59 | Admitting: Physical Therapy

## 2021-03-09 DIAGNOSIS — M6281 Muscle weakness (generalized): Secondary | ICD-10-CM | POA: Diagnosis not present

## 2021-03-09 DIAGNOSIS — M25512 Pain in left shoulder: Secondary | ICD-10-CM | POA: Diagnosis not present

## 2021-03-09 DIAGNOSIS — M25612 Stiffness of left shoulder, not elsewhere classified: Secondary | ICD-10-CM

## 2021-03-09 DIAGNOSIS — M25511 Pain in right shoulder: Secondary | ICD-10-CM

## 2021-03-09 DIAGNOSIS — M542 Cervicalgia: Secondary | ICD-10-CM | POA: Diagnosis not present

## 2021-03-09 NOTE — Therapy (Signed)
[facility] Outpatient Rehabilitation [facility] [address] [place], [place], [address] Phone: [phone]   Fax:  [phone]  Physical Therapy Treatment  Patient Details  Name: Julia Kim MRN: [id number] Date of Birth: October 23, 1956 Referring Provider (PT): Dr Jones Broom   Encounter Date: 03/09/2021   PT End of Session - 03/09/21 1003    Visit Number 2    Number of Visits 12    Date for PT Re-Evaluation 04/12/21    Authorization Type [facility]    PT Start Time 0930    PT Stop Time 1012    PT Time Calculation (min) 42 min    Activity Tolerance Patient tolerated treatment well    Behavior During Therapy [facility] for tasks assessed/performed           Past Medical History:  Diagnosis Date  . Allergic sinusitis   . 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 Full dentures   . GERD (gastroesophageal reflux disease)   . Glaucoma, both eyes   . Hiatal hernia   . Hyperlipidemia   . Hyperlipidemia   . Hypertension   . Partial tear of left rotator cuff   . Vitamin D deficiency   . Wears glasses     Past Surgical History:  Procedure Laterality Date  . CESAREAN SECTION  1975  . COLONOSCOPY    . FOOT SURGERY Right 2002  . SHOULDER ARTHROSCOPY WITH OPEN ROTATOR CUFF REPAIR Left 06/17/2019   Procedure: SHOULDER ARTHROSCOPY WITH OPEN ROTATOR CUFF REPAIR, SUBACROMIAL DECOMPRESSION;  Surgeon: Jones Broom, MD;  Location: [facility];  Service: Orthopedics;  Laterality: Left;  . TRIGGER FINGER RELEASE Right 09/15/2017   Procedure: RELEASE TRIGGER FINGER/A-1 PULLEY RIGHT THUMB;  Surgeon: Cindee Salt, MD;  Location: [facility];  Service: Orthopedics;  Laterality: Right;  Bier block  . ULNAR NERVE TRANSPOSITION Right 09/20/2013   Procedure: RIGHT ULNAR NERVE DECOMPRESSION;  Surgeon: Nicki Reaper, MD;  Location: [facility];  Service: Orthopedics;  Laterality: Right;  . UPPER GASTROINTESTINAL ENDOSCOPY    . UPPER GI ENDOSCOPY       There were no vitals filed for this visit.   Subjective Assessment - 03/09/21 0956    Subjective Patient reports a little throbbing this morning. She reports otherwise it has been helping. She is just having a little pain    Pertinent History left RTC surgery    Limitations Lifting    How long can you sit comfortably? N/A    How long can you stand comfortably? N/A    How long can you walk comfortably? N/A    Diagnostic tests had an x-ray    Patient Stated Goals to have less pain in the shoulder    Currently in Pain? Yes    Pain Score 2     Pain Location Shoulder    Pain Orientation Right    Pain Descriptors / Indicators Aching    Pain Type Acute pain    Pain Radiating Towards ito the deltoid    Pain Onset More than a month ago    Pain Frequency Constant    Aggravating Factors  use of the shoulder    Pain Relieving Factors cortisone shot    Effect of Pain on Daily Activities pain using her arm                             [facility] Adult PT Treatment/Exercise - 03/09/21 0001  Shoulder Exercises: Supine   Other Supine Exercises wand flexion 2lbs 2x10;      Shoulder Exercises: Sidelying   Other Sidelying Exercises sidelying ER 2x10 1lb then 2lb pain noted with 2lbs      Shoulder Exercises: Standing   External Rotation Limitations red x10    Internal Rotation Limitations red x10    Extension Limitations red x10    Row Limitations red x10    Other Standing Exercises UE ranger x20      Shoulder Exercises: Pulleys   Flexion 2 minutes                  PT Education - 03/09/21 1002    Education Details reviewed HEP and symptom management    Person(s) Educated Patient    Methods Explanation;Demonstration;Tactile cues;Verbal cues    Comprehension Returned demonstration;Verbal cues required;Verbalized understanding;Tactile cues required            PT Short Term Goals - 03/01/21 0958      PT SHORT TERM GOAL #1   Title STG=LTG              PT Long Term Goals - 03/01/21 0958      PT LONG TERM GOAL #1   Title Patient will demonstrate 5/5 gorss sttrengthen in order to perfrom ADL's    Time 6    Period Weeks    Status New    Target Date 04/12/21      PT LONG TERM GOAL #2   Title Patient will be reach behind her in her car without pain    Time 6    Period Weeks    Status New    Target Date 04/12/21      PT LONG TERM GOAL #3   Title Patient will be independent with a stretching and strengthening to prevent further episodes of shoulder pain    Time 6    Period Weeks    Status New    Target Date 04/12/21      PT LONG TERM GOAL #4   Title Patient will reach overhead with a 2lb weight without pain in order to put the dishes away    Time 6    Period Weeks    Status New    Target Date 04/12/21                 Plan - 03/09/21 1004    Clinical Impression Statement Patient had full range of motion with just minor pain at tend ranges. She had mild pain with SLER. She tolertaed all other ther-ex well. therapy will continue to progress as tolerated.    Comorbidities left shoulder RTC repair; right ulnar nerve transposition    Examination-Activity Limitations Lift;Reach Overhead;Carry    Examination-Participation Restrictions Cleaning;Laundry;Meal Prep;Shop    Stability/Clinical Decision Making Stable/Uncomplicated    Clinical Decision Making Low    Rehab Potential Excellent    PT Frequency 1x / week    PT Duration 6 weeks    PT Treatment/Interventions ADLs/Self Care Home Management;Electrical Stimulation;Cryotherapy;Iontophoresis 4mg /ml Dexamethasone;Ultrasound;Gait training;DME Instruction;Functional mobility training;Therapeutic exercise;Neuromuscular re-education;Patient/family education;Manual techniques;Passive range of motion;Dry needling;Taping           Patient will benefit from skilled therapeutic intervention in order to improve the following deficits and impairments:  Pain,Impaired UE functional  use,Decreased strength,Decreased mobility,Decreased activity tolerance,Decreased range of motion  Visit Diagnosis: Acute pain of right shoulder  Stiffness of left shoulder, not elsewhere classified  Acute pain of left shoulder  Muscle weakness (  generalized)  Cervicalgia     Problem List Patient Active Problem List   Diagnosis Date Noted  . Polyneuropathy 03/20/2019  . Numbness 03/20/2019  . Gait disturbance 03/20/2019  . Impingement syndrome of right shoulder 05/16/2017  . Special screening for malignant neoplasms, colon 11/28/2011  . Diverticulosis of colon (without mention of hemorrhage) 11/28/2011  . Abdominal pain 11/15/2011  . Polyp of gallbladder 11/15/2011    Dessie Coma PT DPT  03/09/2021, 12:15 PM  [facility] [address] [place], [place], [address] Phone: [phone]   Fax:  [phone]  Name: EDEN TOOHEY MRN: [id number] Date of Birth: 12-Jan-1956

## 2021-03-18 ENCOUNTER — Other Ambulatory Visit: Payer: Self-pay

## 2021-03-18 ENCOUNTER — Ambulatory Visit: Payer: 59 | Admitting: Physical Therapy

## 2021-03-18 ENCOUNTER — Encounter: Payer: Self-pay | Admitting: Physical Therapy

## 2021-03-18 DIAGNOSIS — M25612 Stiffness of left shoulder, not elsewhere classified: Secondary | ICD-10-CM | POA: Diagnosis not present

## 2021-03-18 DIAGNOSIS — M25511 Pain in right shoulder: Secondary | ICD-10-CM | POA: Diagnosis not present

## 2021-03-18 DIAGNOSIS — M6281 Muscle weakness (generalized): Secondary | ICD-10-CM | POA: Diagnosis not present

## 2021-03-18 DIAGNOSIS — M542 Cervicalgia: Secondary | ICD-10-CM | POA: Diagnosis not present

## 2021-03-18 DIAGNOSIS — M25512 Pain in left shoulder: Secondary | ICD-10-CM

## 2021-03-18 NOTE — Therapy (Signed)
[facility] Outpatient Rehabilitation [facility] [address] [place], [place], [address] Phone: [phone]   Fax:  [phone]  Physical Therapy Treatment  Patient Details  Name: Julia Kim MRN: [id number] Date of Birth: 11-12-1956 Referring Provider (PT): Dr Jones Broom   Encounter Date: 03/18/2021   PT End of Session - 03/18/21 1036    Visit Number 3    Number of Visits 12    Date for PT Re-Evaluation 04/12/21    Authorization Type [facility]    PT Start Time 1015    PT Stop Time 1055    PT Time Calculation (min) 40 min    Activity Tolerance Patient tolerated treatment well    Behavior During Therapy [facility] for tasks assessed/performed           Past Medical History:  Diagnosis Date  . Allergic sinusitis   . 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 Full dentures   . GERD (gastroesophageal reflux disease)   . Glaucoma, both eyes   . Hiatal hernia   . Hyperlipidemia   . Hyperlipidemia   . Hypertension   . Partial tear of left rotator cuff   . Vitamin D deficiency   . Wears glasses     Past Surgical History:  Procedure Laterality Date  . CESAREAN SECTION  1975  . COLONOSCOPY    . FOOT SURGERY Right 2002  . SHOULDER ARTHROSCOPY WITH OPEN ROTATOR CUFF REPAIR Left 06/17/2019   Procedure: SHOULDER ARTHROSCOPY WITH OPEN ROTATOR CUFF REPAIR, SUBACROMIAL DECOMPRESSION;  Surgeon: Jones Broom, MD;  Location: [facility];  Service: Orthopedics;  Laterality: Left;  . TRIGGER FINGER RELEASE Right 09/15/2017   Procedure: RELEASE TRIGGER FINGER/A-1 PULLEY RIGHT THUMB;  Surgeon: Cindee Salt, MD;  Location: [facility];  Service: Orthopedics;  Laterality: Right;  Bier block  . ULNAR NERVE TRANSPOSITION Right 09/20/2013   Procedure: RIGHT ULNAR NERVE DECOMPRESSION;  Surgeon: Nicki Reaper, MD;  Location: [facility];  Service: Orthopedics;  Laterality: Right;  . UPPER GASTROINTESTINAL ENDOSCOPY    . UPPER GI ENDOSCOPY       There were no vitals filed for this visit.   Subjective Assessment - 03/18/21 1021    Subjective Patient reports her shoulder has been doing well. She had a little pain when she is throwing but otherwise she is doing well.    Pertinent History left RTC surgery    Limitations Lifting    How long can you sit comfortably? N/A    How long can you stand comfortably? N/A    How long can you walk comfortably? N/A    Diagnostic tests had an x-ray    Currently in Pain? No/denies                             [facility] Adult PT Treatment/Exercise - 03/18/21 0001      Shoulder Exercises: Standing   External Rotation Limitations red x10    Internal Rotation Limitations green 2x10    Extension Limitations green 2x10    Row Limitations green 2x10    Other Standing Exercises wall flexion x20; wall circles x10 each way; wall walk 2x5 each way      Manual Therapy   Manual therapy comments PROM in all planes grade II and III mobilizations in allpalnes                  PT Education - 03/18/21 1034  Education Details HEP asnd symptom mangement    Person(s) Educated Patient    Methods Explanation;Demonstration;Tactile cues;Verbal cues    Comprehension Verbalized understanding;Returned demonstration;Verbal cues required;Tactile cues required            PT Short Term Goals - 03/01/21 0958      PT SHORT TERM GOAL #1   Title STG=LTG             PT Long Term Goals - 03/01/21 0958      PT LONG TERM GOAL #1   Title Patient will demonstrate 5/5 gorss sttrengthen in order to perfrom ADL's    Time 6    Period Weeks    Status New    Target Date 04/12/21      PT LONG TERM GOAL #2   Title Patient will be reach behind her in her car without pain    Time 6    Period Weeks    Status New    Target Date 04/12/21      PT LONG TERM GOAL #3   Title Patient will be independent with a stretching and strengthening to prevent further episodes of shoulder pain    Time  6    Period Weeks    Status New    Target Date 04/12/21      PT LONG TERM GOAL #4   Title Patient will reach overhead with a 2lb weight without pain in order to put the dishes away    Time 6    Period Weeks    Status New    Target Date 04/12/21                 Plan - 03/18/21 1451    Clinical Impression Statement Therapy advanced patients exercises today. She has had very little pain in her shoulder. We worked more on endurance exercises She had minor pain which seemed to be more muscle burn and faituge. We will continue to advance as t tolerated.    Personal Factors and Comorbidities Comorbidity 2;Comorbidity 1    Comorbidities left shoulder RTC repair; right ulnar nerve transposition    Examination-Activity Limitations Lift;Reach Overhead;Carry    Examination-Participation Restrictions Cleaning;Laundry;Meal Prep;Shop    Stability/Clinical Decision Making Stable/Uncomplicated    Clinical Decision Making Low    Rehab Potential Excellent    PT Frequency 1x / week    PT Duration 6 weeks    PT Treatment/Interventions ADLs/Self Care Home Management;Electrical Stimulation;Cryotherapy;Iontophoresis 4mg /ml Dexamethasone;Ultrasound;Gait training;DME Instruction;Functional mobility training;Therapeutic exercise;Neuromuscular re-education;Patient/family education;Manual techniques;Passive range of motion;Dry needling;Taping    PT Next Visit Plan if patient remains pain free consider standing shelf reach flexion and scpation; coisder closed chair strengthening; If patient is having pain consider manual therayp.    PT Home Exercise Plan t-band extesnion, row; IR and ER; shown wand flexion and er if her pain increaes    Consulted and Agree with Plan of Care Patient           Patient will benefit from skilled therapeutic intervention in order to improve the following deficits and impairments:  Pain,Impaired UE functional use,Decreased strength,Decreased mobility,Decreased activity  tolerance,Decreased range of motion  Visit Diagnosis: Acute pain of right shoulder  Stiffness of left shoulder, not elsewhere classified  Acute pain of left shoulder  Muscle weakness (generalized)  Cervicalgia     Problem List Patient Active Problem List   Diagnosis Date Noted  . Polyneuropathy 03/20/2019  . Numbness 03/20/2019  . Gait disturbance 03/20/2019  . Impingement syndrome of right  shoulder 05/16/2017  . Special screening for malignant neoplasms, colon 11/28/2011  . Diverticulosis of colon (without mention of hemorrhage) 11/28/2011  . Abdominal pain 11/15/2011  . Polyp of gallbladder 11/15/2011    Dessie Coma 03/18/2021, 10:55 PM  [facility] [address] [place], [place], [address] Phone: [phone]   Fax:  [phone]  Name: Julia Kim MRN: [id number] Date of Birth: 1956-09-02

## 2021-03-23 ENCOUNTER — Other Ambulatory Visit (HOSPITAL_COMMUNITY): Payer: Self-pay

## 2021-03-23 MED ORDER — SIMBRINZA 1-0.2 % OP SUSP
1.0000 [drp] | Freq: Two times a day (BID) | OPHTHALMIC | 11 refills | Status: DC
  Filled 2021-03-23: qty 8, 40d supply, fill #0

## 2021-03-23 MED ORDER — TRAVOPROST (BAK FREE) 0.004 % OP SOLN
1.0000 [drp] | Freq: Every evening | OPHTHALMIC | 11 refills | Status: DC
  Filled 2021-03-23: qty 7.5, 75d supply, fill #0

## 2021-03-23 MED FILL — Spironolactone Tab 25 MG: ORAL | 90 days supply | Qty: 90 | Fill #0 | Status: AC

## 2021-03-23 MED FILL — Simvastatin Tab 20 MG: ORAL | 90 days supply | Qty: 90 | Fill #0 | Status: AC

## 2021-03-23 MED FILL — Amlodipine Besylate Tab 2.5 MG (Base Equivalent): ORAL | 90 days supply | Qty: 90 | Fill #0 | Status: AC

## 2021-03-24 ENCOUNTER — Other Ambulatory Visit (HOSPITAL_COMMUNITY): Payer: Self-pay

## 2021-03-26 ENCOUNTER — Other Ambulatory Visit (HOSPITAL_COMMUNITY): Payer: Self-pay

## 2021-03-26 ENCOUNTER — Encounter: Payer: Self-pay | Admitting: Physical Therapy

## 2021-03-26 ENCOUNTER — Other Ambulatory Visit: Payer: Self-pay

## 2021-03-26 ENCOUNTER — Ambulatory Visit: Payer: 59 | Attending: Orthopedic Surgery | Admitting: Physical Therapy

## 2021-03-26 DIAGNOSIS — M6281 Muscle weakness (generalized): Secondary | ICD-10-CM | POA: Diagnosis not present

## 2021-03-26 DIAGNOSIS — M25512 Pain in left shoulder: Secondary | ICD-10-CM | POA: Insufficient documentation

## 2021-03-26 DIAGNOSIS — M25511 Pain in right shoulder: Secondary | ICD-10-CM | POA: Insufficient documentation

## 2021-03-26 DIAGNOSIS — M542 Cervicalgia: Secondary | ICD-10-CM | POA: Insufficient documentation

## 2021-03-26 DIAGNOSIS — M25612 Stiffness of left shoulder, not elsewhere classified: Secondary | ICD-10-CM | POA: Insufficient documentation

## 2021-03-26 NOTE — Therapy (Signed)
[facility] [place] [address] [place], [place], [address] Phone: [phone]   Fax:  [phone]  Physical Therapy Treatment  Patient Details  Name: Julia Kim MRN: [id number] Date of Birth: May 02, 1956 Referring Provider (PT): Dr Jones Broom   Encounter Date: 03/26/2021   PT End of Session - 03/26/21 1118    Visit Number 4    Number of Visits 12    Date for PT Re-Evaluation 04/12/21    Authorization Type [facility]    PT Start Time 1100    PT Stop Time 1138    PT Time Calculation (min) 38 min    Activity Tolerance Patient tolerated treatment well    Behavior During Therapy [facility] for tasks assessed/performed           Past Medical History:  Diagnosis Date  . Allergic sinusitis   . 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 Full dentures   . GERD (gastroesophageal reflux disease)   . Glaucoma, both eyes   . Hiatal hernia   . Hyperlipidemia   . Hyperlipidemia   . Hypertension   . Partial tear of left rotator cuff   . Vitamin D deficiency   . Wears glasses     Past Surgical History:  Procedure Laterality Date  . CESAREAN SECTION  1975  . COLONOSCOPY    . FOOT SURGERY Right 2002  . SHOULDER ARTHROSCOPY WITH OPEN ROTATOR CUFF REPAIR Left 06/17/2019   Procedure: SHOULDER ARTHROSCOPY WITH OPEN ROTATOR CUFF REPAIR, SUBACROMIAL DECOMPRESSION;  Surgeon: Jones Broom, MD;  Location: [facility] [place];  Service: Orthopedics;  Laterality: Left;  . TRIGGER FINGER RELEASE Right 09/15/2017   Procedure: RELEASE TRIGGER FINGER/A-1 PULLEY RIGHT THUMB;  Surgeon: Cindee Salt, MD;  Location: [facility];  Service: Orthopedics;  Laterality: Right;  Bier block  . ULNAR NERVE TRANSPOSITION Right 09/20/2013   Procedure: RIGHT ULNAR NERVE DECOMPRESSION;  Surgeon: Nicki Reaper, MD;  Location: [facility];  Service: Orthopedics;  Laterality: Right;  . UPPER GASTROINTESTINAL ENDOSCOPY    . UPPER GI ENDOSCOPY       There were no vitals filed for this visit.   Subjective Assessment - 03/26/21 1102    Subjective Patient reports her pain has come back. Over the past 2-3 days she has had increased pain.    Pertinent History left RTC surgery    Limitations Lifting    How long can you sit comfortably? N/A    How long can you stand comfortably? N/A    How long can you walk comfortably? N/A    Patient Stated Goals to have less pain in the shoulder    Currently in Pain? Yes    Pain Score 7     Pain Location Shoulder    Pain Orientation Right    Pain Descriptors / Indicators Aching    Pain Type Chronic pain    Pain Onset More than a month ago    Pain Frequency Constant    Aggravating Factors  use oif the shoulder    Pain Relieving Factors cortisone shot.    Effect of Pain on Daily Activities pain with use of the arm                             [facility] Adult PT Treatment/Exercise - 03/26/21 0001      Shoulder Exercises: Supine   Other Supine Exercises wand flexion 2x10;  Shoulder Exercises: Sidelying   Other Sidelying Exercises sidelying ER 2x10      Shoulder Exercises: Standing   External Rotation Limitations red x10    Internal Rotation Limitations red 2x10    Extension Limitations red 2x10    Row Limitations green  2x10    Other Standing Exercises wall flexion x20; wall circles x10 each way; wall walk 2x5 each way      Manual Therapy   Manual therapy comments PROM in all planes grade II and III mobilizations in allpalnes                  PT Education - 03/26/21 1105    Education Details reviewed HEP and symptom mangement    Person(s) Educated Patient    Methods Explanation;Demonstration;Tactile cues;Verbal cues    Comprehension Verbalized understanding;Returned demonstration;Verbal cues required;Tactile cues required            PT Short Term Goals - 03/01/21 0958      PT SHORT TERM GOAL #1   Title STG=LTG             PT Long Term Goals -  03/01/21 0958      PT LONG TERM GOAL #1   Title Patient will demonstrate 5/5 gorss sttrengthen in order to perfrom ADL's    Time 6    Period Weeks    Status New    Target Date 04/12/21      PT LONG TERM GOAL #2   Title Patient will be reach behind her in her car without pain    Time 6    Period Weeks    Status New    Target Date 04/12/21      PT LONG TERM GOAL #3   Title Patient will be independent with a stretching and strengthening to prevent further episodes of shoulder pain    Time 6    Period Weeks    Status New    Target Date 04/12/21      PT LONG TERM GOAL #4   Title Patient will reach overhead with a 2lb weight without pain in order to put the dishes away    Time 6    Period Weeks    Status New    Target Date 04/12/21                 Plan - 03/26/21 1149    Clinical Impression Statement Therapy scaled back resitance on her exercises but she was still able to complete all of them. She had minor pain with er. She has a large smasm of her upper trap. She was shown stretche s and self soft tissue mobilization. We will re-assess next visit.    Personal Factors and Comorbidities Comorbidity 2;Comorbidity 1    Comorbidities left shoulder RTC repair; right ulnar nerve transposition    Examination-Activity Limitations Lift;Reach Overhead;Carry    Examination-Participation Restrictions Cleaning;Laundry;Meal Prep;Shop    Stability/Clinical Decision Making Stable/Uncomplicated    Clinical Decision Making Low    Rehab Potential Excellent    PT Frequency 1x / week    PT Duration 6 weeks    PT Treatment/Interventions ADLs/Self Care Home Management;Electrical Stimulation;Cryotherapy;Iontophoresis 4mg /ml Dexamethasone;Ultrasound;Gait training;DME Instruction;Functional mobility training;Therapeutic exercise;Neuromuscular re-education;Patient/family education;Manual techniques;Passive range of motion;Dry needling;Taping    PT Next Visit Plan if patient remains pain free  consider standing shelf reach flexion and scpation; coisder closed chair strengthening; If patient is having pain consider manual therayp.    PT Home Exercise Plan t-band extesnion, row;  IR and ER; shown wand flexion and er if her pain increaes    Consulted and Agree with Plan of Care Patient           Patient will benefit from skilled therapeutic intervention in order to improve the following deficits and impairments:  Pain,Impaired UE functional use,Decreased strength,Decreased mobility,Decreased activity tolerance,Decreased range of motion  Visit Diagnosis: Acute pain of right shoulder  Stiffness of left shoulder, not elsewhere classified  Acute pain of left shoulder  Muscle weakness (generalized)  Cervicalgia     Problem List Patient Active Problem List   Diagnosis Date Noted  . Polyneuropathy 03/20/2019  . Numbness 03/20/2019  . Gait disturbance 03/20/2019  . Impingement syndrome of right shoulder 05/16/2017  . Special screening for malignant neoplasms, colon 11/28/2011  . Diverticulosis of colon (without mention of hemorrhage) 11/28/2011  . Abdominal pain 11/15/2011  . Polyp of gallbladder 11/15/2011    Dessie Coma PT DPT  03/26/2021, 11:54 AM  [facility] [address] [place], [place], [address] Phone: [phone]   Fax:  [phone]  Name: Julia Kim MRN: [id number] Date of Birth: 1956/05/27

## 2021-04-01 ENCOUNTER — Encounter: Payer: Self-pay | Admitting: Physical Therapy

## 2021-04-01 ENCOUNTER — Ambulatory Visit: Payer: 59 | Admitting: Physical Therapy

## 2021-04-01 ENCOUNTER — Other Ambulatory Visit: Payer: Self-pay

## 2021-04-01 DIAGNOSIS — M6281 Muscle weakness (generalized): Secondary | ICD-10-CM | POA: Diagnosis not present

## 2021-04-01 DIAGNOSIS — M25511 Pain in right shoulder: Secondary | ICD-10-CM | POA: Diagnosis not present

## 2021-04-01 DIAGNOSIS — M542 Cervicalgia: Secondary | ICD-10-CM | POA: Diagnosis not present

## 2021-04-01 DIAGNOSIS — M25612 Stiffness of left shoulder, not elsewhere classified: Secondary | ICD-10-CM

## 2021-04-01 DIAGNOSIS — M25512 Pain in left shoulder: Secondary | ICD-10-CM | POA: Diagnosis not present

## 2021-04-01 NOTE — Therapy (Signed)
[facility] Outpatient Rehabilitation [facility] [address] [place], [place], [address] Phone: [phone]   Fax:  [phone]  Physical Therapy Treatment  Patient Details  Name: Julia Kim MRN: [id number] Date of Birth: 1956-02-26 Referring Provider (PT): Dr Jones Broom   Encounter Date: 04/01/2021   PT End of Session - 04/01/21 0910    Visit Number 5    Number of Visits 12    Date for PT Re-Evaluation 04/12/21    Authorization Type [facility] U[facility]    PT Start Time 0845    PT Stop Time 0926    PT Time Calculation (min) 41 min    Activity Tolerance Patient tolerated treatment well    Behavior During Therapy [facility] for tasks assessed/performed           Past Medical History:  Diagnosis Date  . Allergic sinusitis   . 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 Full dentures   . GERD (gastroesophageal reflux disease)   . Glaucoma, both eyes   . Hiatal hernia   . Hyperlipidemia   . Hyperlipidemia   . Hypertension   . Partial tear of left rotator cuff   . Vitamin D deficiency   . Wears glasses     Past Surgical History:  Procedure Laterality Date  . CESAREAN SECTION  1975  . COLONOSCOPY    . FOOT SURGERY Right 2002  . SHOULDER ARTHROSCOPY WITH OPEN ROTATOR CUFF REPAIR Left 06/17/2019   Procedure: SHOULDER ARTHROSCOPY WITH OPEN ROTATOR CUFF REPAIR, SUBACROMIAL DECOMPRESSION;  Surgeon: Jones Broom, MD;  Location: [facility];  Service: Orthopedics;  Laterality: Left;  . TRIGGER FINGER RELEASE Right 09/15/2017   Procedure: RELEASE TRIGGER FINGER/A-1 PULLEY RIGHT THUMB;  Surgeon: Cindee Salt, MD;  Location: [facility];  Service: Orthopedics;  Laterality: Right;  Bier block  . ULNAR NERVE TRANSPOSITION Right 09/20/2013   Procedure: RIGHT ULNAR NERVE DECOMPRESSION;  Surgeon: Nicki Reaper, MD;  Location: [facility];  Service: Orthopedics;  Laterality: Right;  . UPPER GASTROINTESTINAL ENDOSCOPY    . UPPER GI ENDOSCOPY       There were no vitals filed for this visit.   Subjective Assessment - 04/01/21 0846    Subjective Patien reports her shoulder is feeling much better.. She is not having any pain today.    Pertinent History left RTC surgery    Limitations Lifting    How long can you sit comfortably? N/A    How long can you stand comfortably? N/A    How long can you walk comfortably? N/A    Diagnostic tests had an x-ray    Patient Stated Goals to have less pain in the shoulder    Currently in Pain? No/denies    Pain Orientation --    Pain Descriptors / Indicators --    Pain Type --    Pain Onset --    Pain Frequency --    Aggravating Factors  use of the shoulder    Multiple Pain Sites No                             [facility] Adult PT Treatment/Exercise - 04/01/21 0001      Shoulder Exercises: Supine   Other Supine Exercises wand flexion 2x10 1 lb      Shoulder Exercises: Sidelying   Other Sidelying Exercises sidelying ER 2x10 1 lb      Shoulder Exercises: Standing   External Rotation  Limitations red  x10    Internal Rotation Limitations green  2x10    Extension Limitations green 2x10    Row Limitations green  2x10    Other Standing Exercises wall flexion x20; wall circles x10 each way; wall walk 2x5 each way      Manual Therapy   Manual Therapy Joint mobilization    Manual therapy comments PROM in all planes grade II and III mobilizations in allpalnes    Joint Mobilization grade 1 and II inferior gglides                  PT Education - 04/01/21 1400    Education Details progression of exercsies    Person(s) Educated Patient    Methods Explanation;Demonstration;Tactile cues;Verbal cues    Comprehension Verbalized understanding;Returned demonstration;Verbal cues required;Tactile cues required            PT Short Term Goals - 03/01/21 0958      PT SHORT TERM GOAL #1   Title STG=LTG             PT Long Term Goals - 03/01/21 0958      PT LONG TERM  GOAL #1   Title Patient will demonstrate 5/5 gorss sttrengthen in order to perfrom ADL's    Time 6    Period Weeks    Status New    Target Date 04/12/21      PT LONG TERM GOAL #2   Title Patient will be reach behind her in her car without pain    Time 6    Period Weeks    Status New    Target Date 04/12/21      PT LONG TERM GOAL #3   Title Patient will be independent with a stretching and strengthening to prevent further episodes of shoulder pain    Time 6    Period Weeks    Status New    Target Date 04/12/21      PT LONG TERM GOAL #4   Title Patient will reach overhead with a 2lb weight without pain in order to put the dishes away    Time 6    Period Weeks    Status New    Target Date 04/12/21                 Plan - 04/01/21 0914    Clinical Impression Statement Patient tolerated treatment better today. She continues to hva esome pain with ER. Therapy reviewed doorway stretching. for home. Overall she is making goo dprogress. We will continue 1-2 more visits to make sure her recent exacerbation was just a few day thing. Therapy was able to add weights back into her program today.    Personal Factors and Comorbidities Comorbidity 2;Comorbidity 1    Comorbidities left shoulder RTC repair; right ulnar nerve transposition    Examination-Activity Limitations Lift;Reach Overhead;Carry    Examination-Participation Restrictions Cleaning;Laundry;Meal Prep;Shop    Stability/Clinical Decision Making Stable/Uncomplicated    Clinical Decision Making Low    Rehab Potential Excellent    PT Frequency 1x / week    PT Duration 6 weeks    PT Treatment/Interventions ADLs/Self Care Home Management;Electrical Stimulation;Cryotherapy;Iontophoresis 4mg /ml Dexamethasone;Ultrasound;Gait training;DME Instruction;Functional mobility training;Therapeutic exercise;Neuromuscular re-education;Patient/family education;Manual techniques;Passive range of motion;Dry needling;Taping    PT Next Visit  Plan if patient remains pain free consider standing shelf reach flexion and scpation; coisder closed chair strengthening; If patient is having pain consider manual therayp.    PT Home Exercise Plan t-band  extesnion, row; IR and ER; shown wand flexion and er if her pain increaes    Consulted and Agree with Plan of Care Patient           Patient will benefit from skilled therapeutic intervention in order to improve the following deficits and impairments:  Pain,Impaired UE functional use,Decreased strength,Decreased mobility,Decreased activity tolerance,Decreased range of motion  Visit Diagnosis: Acute pain of right shoulder  Stiffness of left shoulder, not elsewhere classified  Acute pain of left shoulder  Muscle weakness (generalized)  Cervicalgia     Problem List Patient Active Problem List   Diagnosis Date Noted  . Polyneuropathy 03/20/2019  . Numbness 03/20/2019  . Gait disturbance 03/20/2019  . Impingement syndrome of right shoulder 05/16/2017  . Special screening for malignant neoplasms, colon 11/28/2011  . Diverticulosis of colon (without mention of hemorrhage) 11/28/2011  . Abdominal pain 11/15/2011  . Polyp of gallbladder 11/15/2011    Dessie Coma PT DPT  04/01/2021, 2:01 PM  [facility] [address] [place], [place], [address] Phone: [phone]   Fax:  [phone]  Name: Julia Kim MRN: [id number] Date of Birth: 01-29-1956

## 2021-04-02 ENCOUNTER — Other Ambulatory Visit (HOSPITAL_COMMUNITY): Payer: Self-pay

## 2021-04-15 ENCOUNTER — Encounter (HOSPITAL_BASED_OUTPATIENT_CLINIC_OR_DEPARTMENT_OTHER): Payer: Self-pay | Admitting: Physical Therapy

## 2021-04-15 ENCOUNTER — Ambulatory Visit (HOSPITAL_BASED_OUTPATIENT_CLINIC_OR_DEPARTMENT_OTHER): Payer: 59 | Attending: Orthopedic Surgery | Admitting: Physical Therapy

## 2021-04-15 ENCOUNTER — Other Ambulatory Visit: Payer: Self-pay

## 2021-04-15 DIAGNOSIS — M25512 Pain in left shoulder: Secondary | ICD-10-CM | POA: Insufficient documentation

## 2021-04-15 DIAGNOSIS — M6281 Muscle weakness (generalized): Secondary | ICD-10-CM | POA: Insufficient documentation

## 2021-04-15 DIAGNOSIS — M25612 Stiffness of left shoulder, not elsewhere classified: Secondary | ICD-10-CM | POA: Diagnosis not present

## 2021-04-15 DIAGNOSIS — M25511 Pain in right shoulder: Secondary | ICD-10-CM | POA: Diagnosis not present

## 2021-04-15 DIAGNOSIS — M542 Cervicalgia: Secondary | ICD-10-CM | POA: Insufficient documentation

## 2021-04-16 ENCOUNTER — Other Ambulatory Visit (HOSPITAL_COMMUNITY): Payer: Self-pay

## 2021-04-16 ENCOUNTER — Encounter (HOSPITAL_BASED_OUTPATIENT_CLINIC_OR_DEPARTMENT_OTHER): Payer: Self-pay | Admitting: Physical Therapy

## 2021-04-16 DIAGNOSIS — H2513 Age-related nuclear cataract, bilateral: Secondary | ICD-10-CM | POA: Diagnosis not present

## 2021-04-16 DIAGNOSIS — H401131 Primary open-angle glaucoma, bilateral, mild stage: Secondary | ICD-10-CM | POA: Diagnosis not present

## 2021-04-16 MED ORDER — SIMBRINZA 1-0.2 % OP SUSP
1.0000 [drp] | Freq: Two times a day (BID) | OPHTHALMIC | 12 refills | Status: AC
  Filled 2021-04-16: qty 16, 90d supply, fill #0

## 2021-04-16 MED ORDER — TRAVOPROST (BAK FREE) 0.004 % OP SOLN
OPHTHALMIC | 12 refills | Status: AC
  Filled 2021-04-16: qty 7.5, 75d supply, fill #0

## 2021-04-16 NOTE — Therapy (Signed)
[facility] [address] [place], [place], [address] Phone: [phone]   Fax:  [phone]  Physical Therapy Treatment  Patient Details  Name: Julia Kim MRN: [id number] Date of Birth: 1956/07/26 Referring Provider (PT): Dr Jones Broom   Encounter Date: 04/15/2021   PT End of Session - 04/15/21 0852    Visit Number 6    Number of Visits 12    Date for PT Re-Evaluation 05/13/21    Authorization Type [facility]    PT Start Time 0845    PT Stop Time 0927    PT Time Calculation (min) 42 min    Activity Tolerance Patient tolerated treatment well    Behavior During Therapy [facility] for tasks assessed/performed           Past Medical History:  Diagnosis Date  . Allergic sinusitis   . 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 Full dentures   . GERD (gastroesophageal reflux disease)   . Glaucoma, both eyes   . Hiatal hernia   . Hyperlipidemia   . Hyperlipidemia   . Hypertension   . Partial tear of left rotator cuff   . Vitamin D deficiency   . Wears glasses     Past Surgical History:  Procedure Laterality Date  . CESAREAN SECTION  1975  . COLONOSCOPY    . FOOT SURGERY Right 2002  . SHOULDER ARTHROSCOPY WITH OPEN ROTATOR CUFF REPAIR Left 06/17/2019   Procedure: SHOULDER ARTHROSCOPY WITH OPEN ROTATOR CUFF REPAIR, SUBACROMIAL DECOMPRESSION;  Surgeon: Jones Broom, MD;  Location: [facility];  Service: Orthopedics;  Laterality: Left;  . TRIGGER FINGER RELEASE Right 09/15/2017   Procedure: RELEASE TRIGGER FINGER/A-1 PULLEY RIGHT THUMB;  Surgeon: Cindee Salt, MD;  Location: [facility];  Service: Orthopedics;  Laterality: Right;  Bier block  . ULNAR NERVE TRANSPOSITION Right 09/20/2013   Procedure: RIGHT ULNAR NERVE DECOMPRESSION;  Surgeon: Nicki Reaper, MD;  Location: [facility];  Service: Orthopedics;  Laterality: Right;  . UPPER GASTROINTESTINAL ENDOSCOPY    . UPPER GI  ENDOSCOPY      There were no vitals filed for this visit.   Subjective Assessment - 04/15/21 0850    Subjective Patient reports her shoulder has been hurting her the past few days. She can not think of a reason.    Pertinent History left RTC surgery    Limitations Lifting    How long can you sit comfortably? N/A    How long can you stand comfortably? N/A    How long can you walk comfortably? N/A    Diagnostic tests had an x-ray    Currently in Pain? No/denies    Pain Score 9     Pain Location Shoulder    Pain Orientation Right    Pain Descriptors / Indicators Aching    Pain Type Chronic pain    Pain Onset More than a month ago    Pain Frequency Constant    Aggravating Factors  using of the shoulder    Pain Relieving Factors cortisone shot    Effect of Pain on Daily Activities pain into the arm                             [facility] Adult PT Treatment/Exercise - 04/16/21 0001      Shoulder Exercises: Supine   Other Supine Exercises wand flexion 2x10 1 lb      Shoulder Exercises:  Sidelying   Other Sidelying Exercises sidelying ER 2x10 1 lb      Shoulder Exercises: Standing   External Rotation Limitations red  x10    Internal Rotation Limitations green  2x10    Extension Limitations green 2x10    Row Limitations green  2x10    Other Standing Exercises wall flexion x20; wall circles x10 each way; wall walk 2x5 each way      Manual Therapy   Manual Therapy Joint mobilization    Manual therapy comments PROM in all planes grade II and III mobilizations in allpalnes    Joint Mobilization grade 1 and II inferior gglides                  PT Education - 04/15/21 1249    Education Details reviewed POC going forward    Person(s) Educated Patient    Methods Explanation;Demonstration;Tactile cues;Verbal cues    Comprehension Verbalized understanding;Returned demonstration;Verbal cues required;Tactile cues required            PT Short Term Goals -  03/01/21 0958      PT SHORT TERM GOAL #1   Title STG=LTG             PT Long Term Goals - 03/01/21 0958      PT LONG TERM GOAL #1   Title Patient will demonstrate 5/5 gorss sttrengthen in order to perfrom ADL's    Time 6    Period Weeks    Status New    Target Date 04/12/21      PT LONG TERM GOAL #2   Title Patient will be reach behind her in her car without pain    Time 6    Period Weeks    Status New    Target Date 04/12/21      PT LONG TERM GOAL #3   Title Patient will be independent with a stretching and strengthening to prevent further episodes of shoulder pain    Time 6    Period Weeks    Status New    Target Date 04/12/21      PT LONG TERM GOAL #4   Title Patient will reach overhead with a 2lb weight without pain in order to put the dishes away    Time 6    Period Weeks    Status New    Target Date 04/12/21                 Plan - 04/15/21 1251    Clinical Impression Statement Patient had increased pain with activity today. She reports it has been like this over the past few days. She has just 130 degrees of shoulder flexion compared to full at eval. She reported up to 9/10 pain with posteriro arm bike. She was advised that that high a level of pain is not expected this long into her plan. She was advised it may be time to return to her MD for a follow up. she reports she feels like this is just a few day thisg. We will continue POC 1W4 tosee if we can get her back to the pain free level she was at before. If she continues to have pain we will get her back to MD for follow up. She was able to complete band exercises with minimal pain. She had full passive range after manual therapy.    Personal Factors and Comorbidities Comorbidity 2;Comorbidity 1    Comorbidities left shoulder RTC repair; right ulnar nerve transposition  Examination-Activity Limitations Lift;Reach Overhead;Carry    Examination-Participation Restrictions Cleaning;Laundry;Meal Prep;Shop     Clinical Decision Making Low    Rehab Potential Excellent    PT Frequency 1x / week    PT Duration 6 weeks    PT Treatment/Interventions ADLs/Self Care Home Management;Electrical Stimulation;Cryotherapy;Iontophoresis 4mg /ml Dexamethasone;Ultrasound;Gait training;DME Instruction;Functional mobility training;Therapeutic exercise;Neuromuscular re-education;Patient/family education;Manual techniques;Passive range of motion;Dry needling;Taping    PT Next Visit Plan if patient remains pain free consider standing shelf reach flexion and scpation; coisder closed chair strengthening; If patient is having pain consider manual therayp.    PT Home Exercise Plan t-band extesnion, row; IR and ER; shown wand flexion and er if her pain increaes    Consulted and Agree with Plan of Care Patient           Patient will benefit from skilled therapeutic intervention in order to improve the following deficits and impairments:  Pain,Impaired UE functional use,Decreased strength,Decreased mobility,Decreased activity tolerance,Decreased range of motion  Visit Diagnosis: Acute pain of right shoulder  Stiffness of left shoulder, not elsewhere classified  Acute pain of left shoulder  Muscle weakness (generalized)  Cervicalgia     Problem List Patient Active Problem List   Diagnosis Date Noted  . Polyneuropathy 03/20/2019  . Numbness 03/20/2019  . Gait disturbance 03/20/2019  . Impingement syndrome of right shoulder 05/16/2017  . Special screening for malignant neoplasms, colon 11/28/2011  . Diverticulosis of colon (without mention of hemorrhage) 11/28/2011  . Abdominal pain 11/15/2011  . Polyp of gallbladder 11/15/2011    11/17/2011 04/16/2021, 9:00 AM  [facility] [address] [place], [place], [place] Phone: [phone]   Fax:  [phone]  Name: Julia Kim MRN: Charleston Ropes Date of Birth: [birth year]

## 2021-04-23 ENCOUNTER — Ambulatory Visit (HOSPITAL_BASED_OUTPATIENT_CLINIC_OR_DEPARTMENT_OTHER): Payer: 59 | Attending: Orthopedic Surgery | Admitting: Physical Therapy

## 2021-04-23 ENCOUNTER — Encounter (HOSPITAL_BASED_OUTPATIENT_CLINIC_OR_DEPARTMENT_OTHER): Payer: Self-pay | Admitting: Physical Therapy

## 2021-04-23 ENCOUNTER — Other Ambulatory Visit: Payer: Self-pay

## 2021-04-23 DIAGNOSIS — M542 Cervicalgia: Secondary | ICD-10-CM | POA: Diagnosis not present

## 2021-04-23 DIAGNOSIS — M25511 Pain in right shoulder: Secondary | ICD-10-CM | POA: Diagnosis not present

## 2021-04-23 DIAGNOSIS — M25512 Pain in left shoulder: Secondary | ICD-10-CM | POA: Insufficient documentation

## 2021-04-23 DIAGNOSIS — M6281 Muscle weakness (generalized): Secondary | ICD-10-CM | POA: Insufficient documentation

## 2021-04-23 DIAGNOSIS — M25612 Stiffness of left shoulder, not elsewhere classified: Secondary | ICD-10-CM | POA: Insufficient documentation

## 2021-04-23 NOTE — Therapy (Signed)
[facility] [address] [place], [place], [address] Phone: [phone]   Fax:  [phone]  Physical Therapy Treatment  Patient Details  Name: Julia Kim MRN: [id number] Date of Birth: 09/19/1956 Referring Provider (PT): Dr Janeal Holmes   Encounter Date: 04/23/2021   PT End of Session - 04/23/21 1045    Visit Number 7    Number of Visits 12    Date for PT Re-Evaluation 05/13/21    Authorization Type [facility]    PT Start Time 1015    PT Stop Time 1053    PT Time Calculation (min) 38 min    Activity Tolerance Patient tolerated treatment well    Behavior During Therapy [facility] for tasks assessed/performed           Past Medical History:  Diagnosis Date  . Allergic sinusitis   . 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 Full dentures   . GERD (gastroesophageal reflux disease)   . Glaucoma, both eyes   . Hiatal hernia   . Hyperlipidemia   . Hyperlipidemia   . Hypertension   . Partial tear of left rotator cuff   . Vitamin D deficiency   . Wears glasses     Past Surgical History:  Procedure Laterality Date  . CESAREAN SECTION  1975  . COLONOSCOPY    . FOOT SURGERY Right 2002  . SHOULDER ARTHROSCOPY WITH OPEN ROTATOR CUFF REPAIR Left 06/17/2019   Procedure: SHOULDER ARTHROSCOPY WITH OPEN ROTATOR CUFF REPAIR, SUBACROMIAL DECOMPRESSION;  Surgeon: Jones Broom, MD;  Location: [facility];  Service: Orthopedics;  Laterality: Left;  . TRIGGER FINGER RELEASE Right 09/15/2017   Procedure: RELEASE TRIGGER FINGER/A-1 PULLEY RIGHT THUMB;  Surgeon: Cindee Salt, MD;  Location: [facility];  Service: Orthopedics;  Laterality: Right;  Bier block  . ULNAR NERVE TRANSPOSITION Right 09/20/2013   Procedure: RIGHT ULNAR NERVE DECOMPRESSION;  Surgeon: Nicki Reaper, MD;  Location: [facility];  Service: Orthopedics;  Laterality: Right;  . UPPER GASTROINTESTINAL ENDOSCOPY    . UPPER GI  ENDOSCOPY      There were no vitals filed for this visit.   Subjective Assessment - 04/23/21 1029    Subjective Patient reports her shoulder is feeling much better. She isn't having any pain. She has been working on her exercises.    Pertinent History left RTC surgery    Limitations Lifting    How long can you sit comfortably? N/A    How long can you stand comfortably? N/A    How long can you walk comfortably? N/A    Diagnostic tests had an x-ray    Patient Stated Goals to have less pain in the shoulder    Currently in Pain? No/denies                             [facility] Adult PT Treatment/Exercise - 04/23/21 0001      Shoulder Exercises: Supine   Other Supine Exercises wand flexion 2x10 1 lb      Shoulder Exercises: Sidelying   Other Sidelying Exercises sidelying ER 2x10 1 lb      Shoulder Exercises: Standing   External Rotation Limitations red  x10    Internal Rotation Limitations green  2x10    Extension Limitations green 2x10    Row Limitations green  2x10    Other Standing Exercises wall flexion x20; wall circles x10 each way; wall walk  2x5 each way    Other Standing Exercises standing flexion x20 1lb scaption x20 1lb in the mirror      Manual Therapy   Manual Therapy Joint mobilization    Manual therapy comments PROM in all planes grade II and III mobilizations in allpalnes    Joint Mobilization grade 1 and II inferior gglides                  PT Education - 04/23/21 1031    Education Details reviewed HEP and symptom mangement    Person(s) Educated Patient    Methods Explanation;Demonstration;Tactile cues;Verbal cues    Comprehension Verbalized understanding;Returned demonstration;Verbal cues required;Tactile cues required            PT Short Term Goals - 03/01/21 0958      PT SHORT TERM GOAL #1   Title STG=LTG             PT Long Term Goals - 03/01/21 0958      PT LONG TERM GOAL #1   Title Patient will demonstrate 5/5 gorss  sttrengthen in order to perfrom ADL's    Time 6    Period Weeks    Status New    Target Date 04/12/21      PT LONG TERM GOAL #2   Title Patient will be reach behind her in her car without pain    Time 6    Period Weeks    Status New    Target Date 04/12/21      PT LONG TERM GOAL #3   Title Patient will be independent with a stretching and strengthening to prevent further episodes of shoulder pain    Time 6    Period Weeks    Status New    Target Date 04/12/21      PT LONG TERM GOAL #4   Title Patient will reach overhead with a 2lb weight without pain in order to put the dishes away    Time 6    Period Weeks    Status New    Target Date 04/12/21                 Plan - 04/23/21 1048    Clinical Impression Statement Mild tightness noted in the right upper trap. Ovelall pain free full ROM with PROM. She tolerated ther-ex well. She had mild discomfort with side lying ER but otherwise she did well. Patient advised to continue strengthening over the next two weeks. We will re-asses at that point.    Personal Factors and Comorbidities Comorbidity 2;Comorbidity 1    Comorbidities left shoulder RTC repair; right ulnar nerve transposition    Examination-Participation Restrictions Cleaning;Laundry;Meal Prep;Shop    Stability/Clinical Decision Making Stable/Uncomplicated    Clinical Decision Making Low    Rehab Potential Excellent    PT Frequency 1x / week    PT Duration 6 weeks    PT Treatment/Interventions ADLs/Self Care Home Management;Electrical Stimulation;Cryotherapy;Iontophoresis 4mg /ml Dexamethasone;Ultrasound;Gait training;DME Instruction;Functional mobility training;Therapeutic exercise;Neuromuscular re-education;Patient/family education;Manual techniques;Passive range of motion;Dry needling;Taping    PT Next Visit Plan if patient remains pain free consider standing shelf reach flexion and scpation; coisder closed chair strengthening; If patient is having pain consider  manual therayp.    PT Home Exercise Plan t-band extesnion, row; IR and ER; shown wand flexion and er if her pain increaes    Consulted and Agree with Plan of Care Patient           Patient will benefit from  skilled therapeutic intervention in order to improve the following deficits and impairments:  Pain,Impaired UE functional use,Decreased strength,Decreased mobility,Decreased activity tolerance,Decreased range of motion  Visit Diagnosis: Acute pain of right shoulder  Stiffness of left shoulder, not elsewhere classified  Acute pain of left shoulder  Muscle weakness (generalized)  Cervicalgia     Problem List Patient Active Problem List   Diagnosis Date Noted  . Polyneuropathy 03/20/2019  . Numbness 03/20/2019  . Gait disturbance 03/20/2019  . Impingement syndrome of right shoulder 05/16/2017  . Special screening for malignant neoplasms, colon 11/28/2011  . Diverticulosis of colon (without mention of hemorrhage) 11/28/2011  . Abdominal pain 11/15/2011  . Polyp of gallbladder 11/15/2011    Dessie Coma PT DPT  04/23/2021, 11:28 AM  [facility] [address] [place], [place], [address] Phone: [phone]   Fax:  [phone]  Name: Julia Kim MRN: [id number] Date of Birth: Mar 23, 1956

## 2021-04-24 ENCOUNTER — Other Ambulatory Visit (HOSPITAL_COMMUNITY): Payer: Self-pay

## 2021-04-27 ENCOUNTER — Other Ambulatory Visit (HOSPITAL_COMMUNITY): Payer: Self-pay

## 2021-04-30 ENCOUNTER — Other Ambulatory Visit (HOSPITAL_COMMUNITY): Payer: Self-pay

## 2021-05-03 ENCOUNTER — Ambulatory Visit (HOSPITAL_BASED_OUTPATIENT_CLINIC_OR_DEPARTMENT_OTHER): Payer: 59 | Admitting: Physical Therapy

## 2021-05-03 ENCOUNTER — Encounter (HOSPITAL_BASED_OUTPATIENT_CLINIC_OR_DEPARTMENT_OTHER): Payer: Self-pay | Admitting: Physical Therapy

## 2021-05-03 ENCOUNTER — Other Ambulatory Visit: Payer: Self-pay

## 2021-05-03 DIAGNOSIS — M6281 Muscle weakness (generalized): Secondary | ICD-10-CM | POA: Diagnosis not present

## 2021-05-03 DIAGNOSIS — M25612 Stiffness of left shoulder, not elsewhere classified: Secondary | ICD-10-CM | POA: Diagnosis not present

## 2021-05-03 DIAGNOSIS — M25512 Pain in left shoulder: Secondary | ICD-10-CM | POA: Diagnosis not present

## 2021-05-03 DIAGNOSIS — M542 Cervicalgia: Secondary | ICD-10-CM | POA: Diagnosis not present

## 2021-05-03 DIAGNOSIS — M25511 Pain in right shoulder: Secondary | ICD-10-CM

## 2021-05-03 NOTE — Therapy (Signed)
[facility] [address] [place], [place], [address] Phone: [phone]   Fax:  [phone]  Physical Therapy Treatment  Patient Details  Name: Julia Kim MRN: [id number] Date of Birth: [birth year] Referring Provider (PT): Dr Janeal Holmes   Encounter Date: 05/03/2021   PT End of Session - 05/03/21 1022    Visit Number 8    Number of Visits 12    Date for PT Re-Evaluation 05/13/21    Authorization Type [facility]    PT Start Time 1015    PT Stop Time 1056    PT Time Calculation (min) 41 min    Activity Tolerance Patient tolerated treatment well    Behavior During Therapy [facility] for tasks assessed/performed           Past Medical History:  Diagnosis Date  . Allergic sinusitis   . 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 Full dentures   . GERD (gastroesophageal reflux disease)   . Glaucoma, both eyes   . Hiatal hernia   . Hyperlipidemia   . Hyperlipidemia   . Hypertension   . Partial tear of left rotator cuff   . Vitamin D deficiency   . Wears glasses     Past Surgical History:  Procedure Laterality Date  . CESAREAN SECTION  1975  . COLONOSCOPY    . FOOT SURGERY Right 2002  . SHOULDER ARTHROSCOPY WITH OPEN ROTATOR CUFF REPAIR Left 06/17/2019   Procedure: SHOULDER ARTHROSCOPY WITH OPEN ROTATOR CUFF REPAIR, SUBACROMIAL DECOMPRESSION;  Surgeon: Jones Broom, MD;  Location: [facility];  Service: Orthopedics;  Laterality: Left;  . TRIGGER FINGER RELEASE Right 09/15/2017   Procedure: RELEASE TRIGGER FINGER/A-1 PULLEY RIGHT THUMB;  Surgeon: Cindee Salt, MD;  Location: [facility];  Service: Orthopedics;  Laterality: Right;  Bier block  . ULNAR NERVE TRANSPOSITION Right 09/20/2013   Procedure: RIGHT ULNAR NERVE DECOMPRESSION;  Surgeon: Nicki Reaper, MD;  Location: [facility];  Service: Orthopedics;  Laterality: Right;  . UPPER GASTROINTESTINAL ENDOSCOPY    . UPPER GI  ENDOSCOPY      There were no vitals filed for this visit.   Subjective Assessment - 05/03/21 1021    Subjective Patient hasn't had shoulder pain. She is doing really well.    Pertinent History left RTC surgery    Limitations Lifting    How long can you sit comfortably? N/A    How long can you stand comfortably? N/A    How long can you walk comfortably? N/A    Diagnostic tests had an x-ray    Patient Stated Goals to have less pain in the shoulder    Currently in Pain? No/denies                             [facility] Adult PT Treatment/Exercise - 05/03/21 0001      Shoulder Exercises: Supine   Other Supine Exercises wand flexion 2x10 1 lb      Shoulder Exercises: Sidelying   Other Sidelying Exercises sidelying ER 2x10 1 lb      Shoulder Exercises: Standing   External Rotation Limitations red  x10    Internal Rotation Limitations green  2x10    Extension Limitations green 2x10    Row Limitations green  2x10    Other Standing Exercises wall flexion x20; wall circles x10 each way; wall walk 2x5 each way    Other Standing Exercises standing  flexion x20 1lb scaption x20 1lb in the mirror      Manual Therapy   Manual Therapy Joint mobilization    Manual therapy comments PROM in all planes grade II and III mobilizations in allpalnes    Joint Mobilization grade 1 and II inferior gglides                  PT Education - 05/03/21 1022    Education Details reviewed HEp and symptom mangement    Person(s) Educated Patient    Methods Explanation;Demonstration;Tactile cues;Verbal cues    Comprehension Verbalized understanding;Returned demonstration;Verbal cues required;Tactile cues required            PT Short Term Goals - 03/01/21 0958      PT SHORT TERM GOAL #1   Title STG=LTG             PT Long Term Goals - 03/01/21 0958      PT LONG TERM GOAL #1   Title Patient will demonstrate 5/5 gorss sttrengthen in order to perfrom ADL's    Time 6     Period Weeks    Status New    Target Date 04/12/21      PT LONG TERM GOAL #2   Title Patient will be reach behind her in her car without pain    Time 6    Period Weeks    Status New    Target Date 04/12/21      PT LONG TERM GOAL #3   Title Patient will be independent with a stretching and strengthening to prevent further episodes of shoulder pain    Time 6    Period Weeks    Status New    Target Date 04/12/21      PT LONG TERM GOAL #4   Title Patient will reach overhead with a 2lb weight without pain in order to put the dishes away    Time 6    Period Weeks    Status New    Target Date 04/12/21                 Plan - 05/03/21 1049    Clinical Impression Statement Patient continues to make good progress. She is having no pain. She did not have pain even with her ER strengthening. She has had no pain even with her functional activity. She will likley discharge next viit if she continues to do well.    Personal Factors and Comorbidities Comorbidity 2;Comorbidity 1    Comorbidities left shoulder RTC repair; right ulnar nerve transposition    Examination-Activity Limitations Lift;Reach Overhead;Carry    Examination-Participation Restrictions Cleaning;Laundry;Meal Prep;Shop    Stability/Clinical Decision Making Stable/Uncomplicated    Clinical Decision Making Low    Rehab Potential Excellent    PT Frequency 1x / week    PT Duration 6 weeks    PT Treatment/Interventions ADLs/Self Care Home Management;Electrical Stimulation;Cryotherapy;Iontophoresis 4mg /ml Dexamethasone;Ultrasound;Gait training;DME Instruction;Functional mobility training;Therapeutic exercise;Neuromuscular re-education;Patient/family education;Manual techniques;Passive range of motion;Dry needling;Taping    PT Next Visit Plan if patient remains pain free consider standing shelf reach flexion and scpation; coisder closed chair strengthening; If patient is having pain consider manual therayp.    PT Home Exercise  Plan t-band extesnion, row; IR and ER; shown wand flexion and er if her pain increaes    Consulted and Agree with Plan of Care Patient           Patient will benefit from skilled therapeutic intervention in order to improve the  following deficits and impairments:  Pain,Impaired UE functional use,Decreased strength,Decreased mobility,Decreased activity tolerance,Decreased range of motion  Visit Diagnosis: Acute pain of right shoulder  Stiffness of left shoulder, not elsewhere classified  Acute pain of left shoulder  Muscle weakness (generalized)     Problem List Patient Active Problem List   Diagnosis Date Noted  . Polyneuropathy 03/20/2019  . Numbness 03/20/2019  . Gait disturbance 03/20/2019  . Impingement syndrome of right shoulder 05/16/2017  . Special screening for malignant neoplasms, colon 11/28/2011  . Diverticulosis of colon (without mention of hemorrhage) 11/28/2011  . Abdominal pain 11/15/2011  . Polyp of gallbladder 11/15/2011    Dessie Coma PT DPT  05/03/2021, 11:25 AM  [facility] [address] [place], [place], [address] Phone: [phone]   Fax:  [phone]  Name: PASTY MANNINEN MRN: [id number] Date of Birth: [birth year]

## 2021-05-11 ENCOUNTER — Ambulatory Visit (HOSPITAL_BASED_OUTPATIENT_CLINIC_OR_DEPARTMENT_OTHER): Payer: 59 | Admitting: Physical Therapy

## 2021-05-11 ENCOUNTER — Other Ambulatory Visit: Payer: Self-pay

## 2021-05-11 ENCOUNTER — Encounter (HOSPITAL_BASED_OUTPATIENT_CLINIC_OR_DEPARTMENT_OTHER): Payer: Self-pay | Admitting: Physical Therapy

## 2021-05-11 DIAGNOSIS — M6281 Muscle weakness (generalized): Secondary | ICD-10-CM | POA: Diagnosis not present

## 2021-05-11 DIAGNOSIS — M25511 Pain in right shoulder: Secondary | ICD-10-CM

## 2021-05-11 DIAGNOSIS — M542 Cervicalgia: Secondary | ICD-10-CM | POA: Diagnosis not present

## 2021-05-11 DIAGNOSIS — M25512 Pain in left shoulder: Secondary | ICD-10-CM

## 2021-05-11 DIAGNOSIS — M25612 Stiffness of left shoulder, not elsewhere classified: Secondary | ICD-10-CM | POA: Diagnosis not present

## 2021-05-11 NOTE — Therapy (Addendum)
[facility] [address] [place], [place], [address] Phone: [phone]   Fax:  [phone]  Physical Therapy Treatment/Discharge   Patient Details  Name: Julia Kim MRN: [id number] Date of Birth: 10-17-1956 Referring Provider (PT): Dr Drenda Freeze   Encounter Date: 05/11/2021   PT End of Session - 05/11/21 1026     Visit Number 9    Number of Visits 12    Date for PT Re-Evaluation 05/13/21    Authorization Type [facility]    PT Start Time 9702    PT Stop Time 1056    PT Time Calculation (min) 38 min    Activity Tolerance Patient tolerated treatment well    Behavior During Therapy [facility] for tasks assessed/performed             Past Medical History:  Diagnosis Date   Allergic sinusitis    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   Full dentures    GERD (gastroesophageal reflux disease)    Glaucoma, both eyes    Hiatal hernia    Hyperlipidemia    Hyperlipidemia    Hypertension    Partial tear of left rotator cuff    Vitamin D deficiency    Wears glasses     Past Surgical History:  Procedure Laterality Date   CESAREAN SECTION  1975   COLONOSCOPY     FOOT SURGERY Right 2002   SHOULDER ARTHROSCOPY WITH OPEN ROTATOR CUFF REPAIR Left 06/17/2019   Procedure: SHOULDER ARTHROSCOPY WITH OPEN ROTATOR CUFF REPAIR, SUBACROMIAL DECOMPRESSION;  Surgeon: Tania Ade, MD;  Location: [facility];  Service: Orthopedics;  Laterality: Left;   TRIGGER FINGER RELEASE Right 09/15/2017   Procedure: RELEASE TRIGGER FINGER/A-1 PULLEY RIGHT THUMB;  Surgeon: Daryll Brod, MD;  Location: [facility];  Service: Orthopedics;  Laterality: Right;  Bier block   ULNAR NERVE TRANSPOSITION Right 09/20/2013   Procedure: RIGHT ULNAR NERVE DECOMPRESSION;  Surgeon: Wynonia Sours, MD;  Location: [facility];  Service: Orthopedics;  Laterality: Right;   UPPER GASTROINTESTINAL ENDOSCOPY     UPPER GI ENDOSCOPY       There were no vitals filed for this visit.   Subjective Assessment - 05/11/21 1034     Subjective Patient continues to have no pain    Pertinent History left RTC surgery    Currently in Pain? No/denies                [facility] PT Assessment - 05/11/21 0001       Assessment   Medical Diagnosis Right Shoulder    Referring Provider (PT) Dr Drenda Freeze                           [facility] Adult PT Treatment/Exercise - 05/11/21 0001       Self-Care   Self-Care Other Self-Care Comments    Other Self-Care Comments  reviewed how to use the HEP in case her shoulder flaires back up. Reviewed progression of exercsies.      Shoulder Exercises: Supine   Other Supine Exercises wand flexion 2x10 1 lb      Shoulder Exercises: Sidelying   Other Sidelying Exercises sidelying ER 2x10 1 lb      Shoulder Exercises: Standing   External Rotation Limitations red  x10    Internal Rotation Limitations green  2x10    Extension Limitations green 2x10    Row Limitations green  2x10  Other Standing Exercises wall flexion x20; wall circles x10 each way; wall walk 2x5 each way    Other Standing Exercises standing flexion x20 1lb scaption x20 1lb in the mirror      Manual Therapy   Manual Therapy Joint mobilization    Manual therapy comments PROM in all planes grade II and III mobilizations in allpalnes    Joint Mobilization grade 1 and II inferior gglides                    PT Education - 05/11/21 1037     Education Details reviewed final HEP    Person(s) Educated Patient    Methods Demonstration;Explanation;Tactile cues;Verbal cues    Comprehension Verbalized understanding;Returned demonstration;Verbal cues required;Tactile cues required              PT Short Term Goals - 03/01/21 0958       PT SHORT TERM GOAL #1   Title STG=LTG               PT Long Term Goals - 05/11/21 1050       PT LONG TERM GOAL #1   Title Patient will demonstrate  5/5 gorss sttrengthen in order to perfrom ADL's    Baseline 5/5    Time 6    Period Weeks    Status On-going      PT LONG TERM GOAL #2   Title Patient will be reach behind her in her car without pain    Baseline able without pain    Time 6    Period Weeks    Status Achieved      PT LONG TERM GOAL #3   Title Patient will be independent with a stretching and strengthening to prevent further episodes of shoulder pain    Baseline Patient able to perfrom without pain    Time 6    Period Weeks    Status Achieved      PT LONG TERM GOAL #4   Title Patient will reach overhead with a 2lb weight without pain in order to put the dishes away    Baseline 2 lbs without pain    Time 6    Period Weeks    Status Achieved                   Plan - 05/11/21 1045     Clinical Impression Statement Patient has made excellent progress. She has not had pain for several weeks. She has kept up with her exercises at home. She has full strength and motion. She has met her FOTO goal. Therapy reviewed her HEP and progression of activity if the pain returns. D/C to HEP    Personal Factors and Comorbidities Comorbidity 2;Comorbidity 1    Comorbidities left shoulder RTC repair; right ulnar nerve transposition    Examination-Activity Limitations Lift;Reach Overhead;Carry    Examination-Participation Restrictions Cleaning;Laundry;Meal Prep;Shop    Stability/Clinical Decision Making Stable/Uncomplicated    Clinical Decision Making Low    Rehab Potential Excellent    PT Frequency 1x / week    PT Duration 6 weeks    PT Treatment/Interventions ADLs/Self Care Home Management;Electrical Stimulation;Cryotherapy;Iontophoresis 19m/ml Dexamethasone;Ultrasound;Gait training;DME Instruction;Functional mobility training;Therapeutic exercise;Neuromuscular re-education;Patient/family education;Manual techniques;Passive range of motion;Dry needling;Taping    PT Next Visit Plan if patient remains pain free consider  standing shelf reach flexion and scpation; coisder closed chair strengthening; If patient is having pain consider manual therayp.    PT Home Exercise Plan t-band extesnion, row;  IR and ER; shown wand flexion and er if her pain increaes    Consulted and Agree with Plan of Care Patient             Patient will benefit from skilled therapeutic intervention in order to improve the following deficits and impairments:  Pain,Impaired UE functional use,Decreased strength,Decreased mobility,Decreased activity tolerance,Decreased range of motion  Visit Diagnosis: Acute pain of right shoulder  Stiffness of left shoulder, not elsewhere classified  Acute pain of left shoulder  Muscle weakness (generalized)  PHYSICAL THERAPY DISCHARGE SUMMARY  Visits from Start of Care: 9  Current functional level related to goals / functional outcomes: Significant improvement in pain and function   Remaining deficits: None    Education / Equipment: Reviewed HEP and symptom management    Patient agrees to discharge. Patient goals were  met. Patient is being discharged due to meeting the stated rehab goals.    Problem List Patient Active Problem List   Diagnosis Date Noted   Polyneuropathy 03/20/2019   Numbness 03/20/2019   Gait disturbance 03/20/2019   Impingement syndrome of right shoulder 05/16/2017   Special screening for malignant neoplasms, colon 11/28/2011   Diverticulosis of colon (without mention of hemorrhage) 11/28/2011   Abdominal pain 11/15/2011   Polyp of gallbladder 11/15/2011    Carney Living PT DPT  05/11/2021, 12:15 PM  [facility] [place], [place], [address] Phone: [phone]   Fax:  [phone]  Name: HELAYNE METSKER MRN: [id number] Date of Birth: 1956/02/10

## 2021-06-28 ENCOUNTER — Other Ambulatory Visit (HOSPITAL_COMMUNITY): Payer: Self-pay

## 2021-06-28 MED FILL — Ergocalciferol Cap 1.25 MG (50000 Unit): ORAL | 84 days supply | Qty: 13 | Fill #0 | Status: AC

## 2021-07-13 ENCOUNTER — Other Ambulatory Visit (HOSPITAL_COMMUNITY): Payer: Self-pay

## 2021-07-13 MED FILL — Amlodipine Besylate Tab 2.5 MG (Base Equivalent): ORAL | 90 days supply | Qty: 90 | Fill #1 | Status: AC

## 2021-07-13 MED FILL — Spironolactone Tab 25 MG: ORAL | 90 days supply | Qty: 90 | Fill #1 | Status: AC

## 2021-08-17 ENCOUNTER — Other Ambulatory Visit (HOSPITAL_COMMUNITY): Payer: Self-pay

## 2021-08-17 MED FILL — Simvastatin Tab 20 MG: ORAL | 90 days supply | Qty: 90 | Fill #1 | Status: AC

## 2021-08-24 ENCOUNTER — Other Ambulatory Visit (HOSPITAL_COMMUNITY): Payer: Self-pay

## 2021-08-24 DIAGNOSIS — J302 Other seasonal allergic rhinitis: Secondary | ICD-10-CM | POA: Diagnosis not present

## 2021-08-24 DIAGNOSIS — E559 Vitamin D deficiency, unspecified: Secondary | ICD-10-CM | POA: Diagnosis not present

## 2021-08-24 DIAGNOSIS — K219 Gastro-esophageal reflux disease without esophagitis: Secondary | ICD-10-CM | POA: Diagnosis not present

## 2021-08-24 DIAGNOSIS — E782 Mixed hyperlipidemia: Secondary | ICD-10-CM | POA: Diagnosis not present

## 2021-08-24 DIAGNOSIS — Z23 Encounter for immunization: Secondary | ICD-10-CM | POA: Diagnosis not present

## 2021-08-24 DIAGNOSIS — R232 Flushing: Secondary | ICD-10-CM | POA: Diagnosis not present

## 2021-08-24 DIAGNOSIS — I10 Essential (primary) hypertension: Secondary | ICD-10-CM | POA: Diagnosis not present

## 2021-08-24 DIAGNOSIS — L932 Other local lupus erythematosus: Secondary | ICD-10-CM | POA: Diagnosis not present

## 2021-08-24 DIAGNOSIS — Z0000 Encounter for general adult medical examination without abnormal findings: Secondary | ICD-10-CM | POA: Diagnosis not present

## 2021-08-24 DIAGNOSIS — M7918 Myalgia, other site: Secondary | ICD-10-CM | POA: Diagnosis not present

## 2021-08-24 MED ORDER — AMLODIPINE BESYLATE 2.5 MG PO TABS
ORAL_TABLET | ORAL | 1 refills | Status: DC
  Filled 2021-08-24 – 2021-10-18 (×2): qty 90, 90d supply, fill #0
  Filled 2022-01-26: qty 90, 90d supply, fill #1

## 2021-08-24 MED ORDER — SPIRONOLACTONE 25 MG PO TABS
ORAL_TABLET | ORAL | 1 refills | Status: DC
  Filled 2021-08-24 – 2021-10-18 (×2): qty 90, 90d supply, fill #0
  Filled 2022-01-26: qty 90, 90d supply, fill #1

## 2021-08-24 MED ORDER — SIMVASTATIN 20 MG PO TABS
ORAL_TABLET | ORAL | 1 refills | Status: DC
  Filled 2021-08-24 – 2021-12-21 (×2): qty 90, 90d supply, fill #0
  Filled 2022-04-27: qty 90, 90d supply, fill #1

## 2021-08-24 MED ORDER — VITAMIN D (ERGOCALCIFEROL) 50000 UNITS PO CAPS
ORAL_CAPSULE | ORAL | 1 refills | Status: AC
Start: 2021-08-24 — End: ?
  Filled 2021-08-24: qty 13, 90d supply, fill #0

## 2021-08-27 ENCOUNTER — Other Ambulatory Visit (HOSPITAL_COMMUNITY): Payer: Self-pay

## 2021-09-03 ENCOUNTER — Other Ambulatory Visit (HOSPITAL_COMMUNITY): Payer: Self-pay

## 2021-10-18 ENCOUNTER — Other Ambulatory Visit (HOSPITAL_COMMUNITY): Payer: Self-pay

## 2021-11-19 ENCOUNTER — Other Ambulatory Visit: Payer: Self-pay | Admitting: Family Medicine

## 2021-11-19 DIAGNOSIS — Z1231 Encounter for screening mammogram for malignant neoplasm of breast: Secondary | ICD-10-CM

## 2021-12-21 ENCOUNTER — Other Ambulatory Visit (HOSPITAL_COMMUNITY): Payer: Self-pay

## 2021-12-28 ENCOUNTER — Ambulatory Visit
Admission: RE | Admit: 2021-12-28 | Discharge: 2021-12-28 | Disposition: A | Discharge status: Home / Self Care | Payer: 59 | Source: Ambulatory Visit | Attending: Family Medicine | Admitting: Family Medicine

## 2021-12-28 DIAGNOSIS — Z1231 Encounter for screening mammogram for malignant neoplasm of breast: Secondary | ICD-10-CM

## 2022-01-26 ENCOUNTER — Other Ambulatory Visit (HOSPITAL_COMMUNITY): Payer: Self-pay

## 2022-01-27 ENCOUNTER — Other Ambulatory Visit (HOSPITAL_COMMUNITY): Payer: Self-pay

## 2022-02-21 ENCOUNTER — Other Ambulatory Visit (HOSPITAL_COMMUNITY): Payer: Self-pay

## 2022-02-21 DIAGNOSIS — E782 Mixed hyperlipidemia: Secondary | ICD-10-CM | POA: Diagnosis not present

## 2022-02-21 DIAGNOSIS — J302 Other seasonal allergic rhinitis: Secondary | ICD-10-CM | POA: Diagnosis not present

## 2022-02-21 DIAGNOSIS — R232 Flushing: Secondary | ICD-10-CM | POA: Diagnosis not present

## 2022-02-21 DIAGNOSIS — R7309 Other abnormal glucose: Secondary | ICD-10-CM | POA: Diagnosis not present

## 2022-02-21 DIAGNOSIS — L932 Other local lupus erythematosus: Secondary | ICD-10-CM | POA: Diagnosis not present

## 2022-02-21 DIAGNOSIS — I10 Essential (primary) hypertension: Secondary | ICD-10-CM | POA: Diagnosis not present

## 2022-02-21 DIAGNOSIS — K219 Gastro-esophageal reflux disease without esophagitis: Secondary | ICD-10-CM | POA: Diagnosis not present

## 2022-02-21 DIAGNOSIS — E559 Vitamin D deficiency, unspecified: Secondary | ICD-10-CM | POA: Diagnosis not present

## 2022-02-21 MED ORDER — TELMISARTAN 20 MG PO TABS
ORAL_TABLET | ORAL | 5 refills | Status: AC
  Filled 2022-02-21: qty 30, 30d supply, fill #0

## 2022-02-21 MED ORDER — SIMVASTATIN 20 MG PO TABS
ORAL_TABLET | ORAL | 1 refills | Status: AC
  Filled 2022-02-21: qty 90, 90d supply, fill #0

## 2022-02-21 MED ORDER — AMLODIPINE BESYLATE 2.5 MG PO TABS
ORAL_TABLET | ORAL | 1 refills | Status: DC
  Filled 2022-02-21 – 2022-04-27 (×2): qty 90, 90d supply, fill #0
  Filled 2022-08-11: qty 90, 90d supply, fill #1

## 2022-02-21 MED ORDER — SPIRONOLACTONE 25 MG PO TABS
ORAL_TABLET | ORAL | 1 refills | Status: DC
  Filled 2022-02-21 – 2022-04-27 (×2): qty 90, 90d supply, fill #0
  Filled 2022-08-11: qty 90, 90d supply, fill #1

## 2022-02-21 MED ORDER — VITAMIN D (ERGOCALCIFEROL) 50000 UNITS PO CAPS
ORAL_CAPSULE | ORAL | 1 refills | Status: AC
  Filled 2022-02-21: qty 13, 90d supply, fill #0
  Filled 2023-01-09: qty 13, 90d supply, fill #1

## 2022-03-28 ENCOUNTER — Other Ambulatory Visit (HOSPITAL_COMMUNITY): Payer: Self-pay

## 2022-03-28 MED ORDER — TELMISARTAN 20 MG PO TABS
ORAL_TABLET | ORAL | 1 refills | Status: DC
  Filled 2022-03-28: qty 90, 90d supply, fill #0
  Filled 2022-06-28: qty 90, 90d supply, fill #1

## 2022-04-18 ENCOUNTER — Other Ambulatory Visit (HOSPITAL_COMMUNITY): Payer: Self-pay

## 2022-04-18 DIAGNOSIS — H401131 Primary open-angle glaucoma, bilateral, mild stage: Secondary | ICD-10-CM | POA: Diagnosis not present

## 2022-04-18 DIAGNOSIS — H2513 Age-related nuclear cataract, bilateral: Secondary | ICD-10-CM | POA: Diagnosis not present

## 2022-04-18 MED ORDER — SIMBRINZA 1-0.2 % OP SUSP
1.0000 [drp] | Freq: Two times a day (BID) | OPHTHALMIC | 12 refills | Status: AC
  Filled 2022-04-18: qty 16, 70d supply, fill #0

## 2022-04-18 MED ORDER — TRAVOPROST (BAK FREE) 0.004 % OP SOLN
OPHTHALMIC | 12 refills | Status: AC
  Filled 2022-04-18: qty 7.5, 75d supply, fill #0

## 2022-04-19 ENCOUNTER — Other Ambulatory Visit (HOSPITAL_COMMUNITY): Payer: Self-pay

## 2022-04-21 ENCOUNTER — Other Ambulatory Visit (HOSPITAL_COMMUNITY): Payer: Self-pay

## 2022-04-27 ENCOUNTER — Other Ambulatory Visit (HOSPITAL_COMMUNITY): Payer: Self-pay

## 2022-05-23 ENCOUNTER — Other Ambulatory Visit (HOSPITAL_COMMUNITY): Payer: Self-pay

## 2022-05-23 DIAGNOSIS — R109 Unspecified abdominal pain: Secondary | ICD-10-CM | POA: Diagnosis not present

## 2022-05-23 MED ORDER — DICYCLOMINE HCL 20 MG PO TABS
ORAL_TABLET | ORAL | 0 refills | Status: AC
  Filled 2022-05-23: qty 15, 5d supply, fill #0

## 2022-06-28 ENCOUNTER — Other Ambulatory Visit (HOSPITAL_COMMUNITY): Payer: Self-pay

## 2022-08-11 ENCOUNTER — Other Ambulatory Visit (HOSPITAL_COMMUNITY): Payer: Self-pay

## 2022-10-04 ENCOUNTER — Other Ambulatory Visit (HOSPITAL_COMMUNITY): Payer: Self-pay

## 2022-10-06 ENCOUNTER — Other Ambulatory Visit (HOSPITAL_COMMUNITY): Payer: Self-pay

## 2022-10-06 MED ORDER — TELMISARTAN 20 MG PO TABS
20.0000 mg | ORAL_TABLET | Freq: Every day | ORAL | 0 refills | Status: DC
  Filled 2022-10-06: qty 90, 90d supply, fill #0

## 2022-10-13 ENCOUNTER — Other Ambulatory Visit (HOSPITAL_COMMUNITY): Payer: Self-pay

## 2022-10-13 DIAGNOSIS — Z0000 Encounter for general adult medical examination without abnormal findings: Secondary | ICD-10-CM | POA: Diagnosis not present

## 2022-10-13 DIAGNOSIS — R7303 Prediabetes: Secondary | ICD-10-CM | POA: Diagnosis not present

## 2022-10-13 DIAGNOSIS — J302 Other seasonal allergic rhinitis: Secondary | ICD-10-CM | POA: Diagnosis not present

## 2022-10-13 DIAGNOSIS — E559 Vitamin D deficiency, unspecified: Secondary | ICD-10-CM | POA: Diagnosis not present

## 2022-10-13 DIAGNOSIS — K219 Gastro-esophageal reflux disease without esophagitis: Secondary | ICD-10-CM | POA: Diagnosis not present

## 2022-10-13 DIAGNOSIS — R232 Flushing: Secondary | ICD-10-CM | POA: Diagnosis not present

## 2022-10-13 DIAGNOSIS — I10 Essential (primary) hypertension: Secondary | ICD-10-CM | POA: Diagnosis not present

## 2022-10-13 DIAGNOSIS — Z23 Encounter for immunization: Secondary | ICD-10-CM | POA: Diagnosis not present

## 2022-10-13 DIAGNOSIS — L932 Other local lupus erythematosus: Secondary | ICD-10-CM | POA: Diagnosis not present

## 2022-10-13 DIAGNOSIS — E782 Mixed hyperlipidemia: Secondary | ICD-10-CM | POA: Diagnosis not present

## 2022-10-13 MED ORDER — SIMVASTATIN 20 MG PO TABS
20.0000 mg | ORAL_TABLET | Freq: Every day | ORAL | 1 refills | Status: AC
Start: 2022-10-13 — End: ?
  Filled 2022-10-13: qty 90, 90d supply, fill #0

## 2022-10-13 MED ORDER — AMLODIPINE BESYLATE 2.5 MG PO TABS
2.5000 mg | ORAL_TABLET | Freq: Every day | ORAL | 1 refills | Status: DC
  Filled 2022-10-13 – 2022-11-09 (×2): qty 90, 90d supply, fill #0
  Filled 2023-02-13: qty 90, 90d supply, fill #1

## 2022-10-13 MED ORDER — SPIRONOLACTONE 25 MG PO TABS
25.0000 mg | ORAL_TABLET | Freq: Every day | ORAL | 1 refills | Status: DC
  Filled 2022-10-13 – 2022-11-07 (×2): qty 90, 90d supply, fill #0
  Filled 2023-02-13: qty 90, 90d supply, fill #1

## 2022-10-13 MED ORDER — VITAMIN D (ERGOCALCIFEROL) 50000 UNITS PO CAPS
1.0000 | ORAL_CAPSULE | Freq: Every day | ORAL | 1 refills | Status: AC
  Filled 2022-10-13: qty 13, 90d supply, fill #0

## 2022-10-13 MED ORDER — TELMISARTAN 20 MG PO TABS
20.0000 mg | ORAL_TABLET | Freq: Every day | ORAL | 1 refills | Status: AC
  Filled 2022-10-13: qty 90, 90d supply, fill #0

## 2022-11-07 ENCOUNTER — Other Ambulatory Visit (HOSPITAL_COMMUNITY): Payer: Self-pay

## 2022-11-08 ENCOUNTER — Other Ambulatory Visit (HOSPITAL_COMMUNITY): Payer: Self-pay

## 2022-11-08 MED ORDER — OMEPRAZOLE 40 MG PO CPDR
40.0000 mg | DELAYED_RELEASE_CAPSULE | Freq: Every day | ORAL | 0 refills | Status: AC
  Filled 2022-11-08: qty 90, 90d supply, fill #0

## 2022-11-09 ENCOUNTER — Other Ambulatory Visit (HOSPITAL_COMMUNITY): Payer: Self-pay

## 2022-11-09 ENCOUNTER — Other Ambulatory Visit (HOSPITAL_BASED_OUTPATIENT_CLINIC_OR_DEPARTMENT_OTHER): Payer: Self-pay

## 2022-11-09 MED ORDER — SIMVASTATIN 20 MG PO TABS
20.0000 mg | ORAL_TABLET | Freq: Every evening | ORAL | 1 refills | Status: AC
  Filled 2022-11-09: qty 90, 90d supply, fill #0
  Filled 2023-04-10 – 2023-04-13 (×2): qty 90, 90d supply, fill #1

## 2022-11-14 ENCOUNTER — Other Ambulatory Visit (HOSPITAL_COMMUNITY): Payer: Self-pay

## 2022-11-21 ENCOUNTER — Other Ambulatory Visit: Payer: Self-pay | Admitting: Family Medicine

## 2022-11-21 DIAGNOSIS — Z1231 Encounter for screening mammogram for malignant neoplasm of breast: Secondary | ICD-10-CM

## 2023-01-09 ENCOUNTER — Other Ambulatory Visit (HOSPITAL_COMMUNITY): Payer: Self-pay

## 2023-01-09 MED ORDER — TELMISARTAN 20 MG PO TABS
20.0000 mg | ORAL_TABLET | Freq: Every day | ORAL | 1 refills | Status: AC
  Filled 2023-01-09: qty 90, 90d supply, fill #0
  Filled 2023-04-10 – 2023-04-13 (×2): qty 90, 90d supply, fill #1

## 2023-01-17 ENCOUNTER — Ambulatory Visit
Admission: RE | Admit: 2023-01-17 | Discharge: 2023-01-17 | Disposition: A | Discharge status: Home / Self Care | Payer: Commercial Managed Care - PPO | Source: Ambulatory Visit | Attending: Family Medicine | Admitting: Family Medicine

## 2023-01-17 DIAGNOSIS — Z1231 Encounter for screening mammogram for malignant neoplasm of breast: Secondary | ICD-10-CM

## 2023-01-19 ENCOUNTER — Other Ambulatory Visit: Payer: Self-pay | Admitting: Family Medicine

## 2023-01-19 DIAGNOSIS — R928 Other abnormal and inconclusive findings on diagnostic imaging of breast: Secondary | ICD-10-CM

## 2023-01-26 ENCOUNTER — Ambulatory Visit
Admission: RE | Admit: 2023-01-26 | Discharge: 2023-01-26 | Disposition: A | Discharge status: Home / Self Care | Payer: Commercial Managed Care - PPO | Source: Ambulatory Visit | Attending: Family Medicine | Admitting: Family Medicine

## 2023-01-26 DIAGNOSIS — N6012 Diffuse cystic mastopathy of left breast: Secondary | ICD-10-CM | POA: Diagnosis not present

## 2023-01-26 DIAGNOSIS — R928 Other abnormal and inconclusive findings on diagnostic imaging of breast: Secondary | ICD-10-CM

## 2023-02-13 ENCOUNTER — Other Ambulatory Visit (HOSPITAL_COMMUNITY): Payer: Self-pay

## 2023-02-13 MED ORDER — SCOPOLAMINE 1 MG/3DAYS TD PT72
1.0000 | MEDICATED_PATCH | TRANSDERMAL | 0 refills | Status: AC | PRN
  Filled 2023-02-13: qty 4, 12d supply, fill #0

## 2023-04-13 ENCOUNTER — Other Ambulatory Visit: Payer: Self-pay

## 2023-04-13 ENCOUNTER — Other Ambulatory Visit (HOSPITAL_COMMUNITY): Payer: Self-pay

## 2023-05-05 ENCOUNTER — Other Ambulatory Visit (HOSPITAL_COMMUNITY): Payer: Self-pay

## 2023-05-05 DIAGNOSIS — H2513 Age-related nuclear cataract, bilateral: Secondary | ICD-10-CM | POA: Diagnosis not present

## 2023-05-05 DIAGNOSIS — H401131 Primary open-angle glaucoma, bilateral, mild stage: Secondary | ICD-10-CM | POA: Diagnosis not present

## 2023-05-05 MED ORDER — SIMBRINZA 1-0.2 % OP SUSP: 1.0000 [drp] | Freq: Two times a day (BID) | OPHTHALMIC | 12 refills | Status: AC

## 2023-05-05 MED ORDER — TRAVOPROST (BAK FREE) 0.004 % OP SOLN: 1.0000 [drp] | Freq: Every evening | OPHTHALMIC | 12 refills | Status: AC

## 2023-05-12 ENCOUNTER — Other Ambulatory Visit (HOSPITAL_COMMUNITY): Payer: Self-pay

## 2023-05-12 ENCOUNTER — Other Ambulatory Visit: Payer: Self-pay

## 2023-05-12 DIAGNOSIS — R7303 Prediabetes: Secondary | ICD-10-CM | POA: Diagnosis not present

## 2023-05-12 DIAGNOSIS — M1812 Unilateral primary osteoarthritis of first carpometacarpal joint, left hand: Secondary | ICD-10-CM | POA: Diagnosis not present

## 2023-05-12 DIAGNOSIS — J302 Other seasonal allergic rhinitis: Secondary | ICD-10-CM | POA: Diagnosis not present

## 2023-05-12 DIAGNOSIS — K219 Gastro-esophageal reflux disease without esophagitis: Secondary | ICD-10-CM | POA: Diagnosis not present

## 2023-05-12 DIAGNOSIS — R232 Flushing: Secondary | ICD-10-CM | POA: Diagnosis not present

## 2023-05-12 DIAGNOSIS — E782 Mixed hyperlipidemia: Secondary | ICD-10-CM | POA: Diagnosis not present

## 2023-05-12 DIAGNOSIS — I10 Essential (primary) hypertension: Secondary | ICD-10-CM | POA: Diagnosis not present

## 2023-05-12 DIAGNOSIS — E559 Vitamin D deficiency, unspecified: Secondary | ICD-10-CM | POA: Diagnosis not present

## 2023-05-12 DIAGNOSIS — L932 Other local lupus erythematosus: Secondary | ICD-10-CM | POA: Diagnosis not present

## 2023-05-12 DIAGNOSIS — K644 Residual hemorrhoidal skin tags: Secondary | ICD-10-CM | POA: Diagnosis not present

## 2023-05-12 MED ORDER — VITAMIN D (ERGOCALCIFEROL) 50000 UNITS PO CAPS: 50000.0000 [IU] | ORAL_CAPSULE | ORAL | 1 refills | Status: AC

## 2023-05-12 MED ORDER — SIMVASTATIN 20 MG PO TABS
20.0000 mg | ORAL_TABLET | Freq: Every evening | ORAL | 1 refills | Status: AC
  Filled 2023-05-12: qty 90, 90d supply, fill #0

## 2023-05-12 MED ORDER — SPIRONOLACTONE 25 MG PO TABS
25.0000 mg | ORAL_TABLET | Freq: Every day | ORAL | 1 refills | Status: DC
  Filled 2023-06-05: qty 90, 90d supply, fill #0
  Filled 2023-09-06: qty 90, 90d supply, fill #1

## 2023-05-12 MED ORDER — OMEPRAZOLE 40 MG PO CPDR
40.0000 mg | DELAYED_RELEASE_CAPSULE | Freq: Every morning | ORAL | 0 refills | Status: AC
  Filled 2023-05-12: qty 90, 90d supply, fill #0

## 2023-05-12 MED ORDER — TELMISARTAN 20 MG PO TABS
20.0000 mg | ORAL_TABLET | Freq: Every day | ORAL | 1 refills | Status: DC
  Filled 2023-05-12 – 2023-08-08 (×2): qty 90, 90d supply, fill #0
  Filled 2023-11-14: qty 90, 90d supply, fill #1

## 2023-05-12 MED ORDER — AMLODIPINE BESYLATE 2.5 MG PO TABS
2.5000 mg | ORAL_TABLET | Freq: Every day | ORAL | 1 refills | Status: DC
  Filled 2023-06-05: qty 90, 90d supply, fill #0
  Filled 2023-09-06: qty 90, 90d supply, fill #1

## 2023-06-05 ENCOUNTER — Other Ambulatory Visit: Payer: Self-pay

## 2023-08-03 ENCOUNTER — Other Ambulatory Visit (HOSPITAL_COMMUNITY): Payer: Self-pay

## 2023-08-04 DIAGNOSIS — Z111 Encounter for screening for respiratory tuberculosis: Secondary | ICD-10-CM | POA: Diagnosis not present

## 2023-08-08 ENCOUNTER — Other Ambulatory Visit (HOSPITAL_COMMUNITY): Payer: Self-pay

## 2023-08-09 ENCOUNTER — Other Ambulatory Visit (HOSPITAL_COMMUNITY): Payer: Self-pay

## 2023-09-06 ENCOUNTER — Other Ambulatory Visit (HOSPITAL_COMMUNITY): Payer: Self-pay

## 2023-11-07 ENCOUNTER — Other Ambulatory Visit (HOSPITAL_COMMUNITY): Payer: Self-pay

## 2023-11-15 ENCOUNTER — Other Ambulatory Visit (HOSPITAL_COMMUNITY): Payer: Self-pay

## 2023-12-04 ENCOUNTER — Other Ambulatory Visit (HOSPITAL_COMMUNITY): Payer: Self-pay

## 2023-12-04 DIAGNOSIS — R232 Flushing: Secondary | ICD-10-CM | POA: Diagnosis not present

## 2023-12-04 DIAGNOSIS — E782 Mixed hyperlipidemia: Secondary | ICD-10-CM | POA: Diagnosis not present

## 2023-12-04 DIAGNOSIS — Z0000 Encounter for general adult medical examination without abnormal findings: Secondary | ICD-10-CM | POA: Diagnosis not present

## 2023-12-04 DIAGNOSIS — I10 Essential (primary) hypertension: Secondary | ICD-10-CM | POA: Diagnosis not present

## 2023-12-04 DIAGNOSIS — Z1211 Encounter for screening for malignant neoplasm of colon: Secondary | ICD-10-CM | POA: Diagnosis not present

## 2023-12-04 DIAGNOSIS — L932 Other local lupus erythematosus: Secondary | ICD-10-CM | POA: Diagnosis not present

## 2023-12-04 DIAGNOSIS — K219 Gastro-esophageal reflux disease without esophagitis: Secondary | ICD-10-CM | POA: Diagnosis not present

## 2023-12-04 DIAGNOSIS — E559 Vitamin D deficiency, unspecified: Secondary | ICD-10-CM | POA: Diagnosis not present

## 2023-12-04 DIAGNOSIS — R7303 Prediabetes: Secondary | ICD-10-CM | POA: Diagnosis not present

## 2023-12-04 DIAGNOSIS — J302 Other seasonal allergic rhinitis: Secondary | ICD-10-CM | POA: Diagnosis not present

## 2023-12-04 MED ORDER — VITAMIN D (ERGOCALCIFEROL) 50000 UNITS PO CAPS
ORAL_CAPSULE | ORAL | 1 refills | Status: AC
  Filled 2023-12-04: qty 13, 90d supply, fill #0
  Filled 2024-03-01: qty 13, 90d supply, fill #1

## 2023-12-04 MED ORDER — SIMVASTATIN 20 MG PO TABS
20.0000 mg | ORAL_TABLET | Freq: Every day | ORAL | 1 refills | Status: DC
  Filled 2023-12-04: qty 90, 90d supply, fill #0
  Filled 2024-04-18: qty 90, 90d supply, fill #1

## 2023-12-04 MED ORDER — SPIRONOLACTONE 25 MG PO TABS
25.0000 mg | ORAL_TABLET | Freq: Every day | ORAL | 1 refills | Status: DC
  Filled 2023-12-04: qty 90, 90d supply, fill #0
  Filled 2024-04-18: qty 90, 90d supply, fill #1

## 2023-12-04 MED ORDER — TELMISARTAN 20 MG PO TABS
20.0000 mg | ORAL_TABLET | Freq: Every day | ORAL | 1 refills | Status: AC
  Filled 2023-12-04 – 2024-03-01 (×2): qty 90, 90d supply, fill #0

## 2023-12-04 MED ORDER — OMEPRAZOLE 40 MG PO CPDR
40.0000 mg | DELAYED_RELEASE_CAPSULE | Freq: Every day | ORAL | 1 refills | Status: AC
  Filled 2023-12-04: qty 90, 90d supply, fill #0

## 2023-12-04 MED ORDER — AMLODIPINE BESYLATE 2.5 MG PO TABS
2.5000 mg | ORAL_TABLET | Freq: Every day | ORAL | 1 refills | Status: DC
  Filled 2023-12-04: qty 90, 90d supply, fill #0
  Filled 2024-04-18: qty 90, 90d supply, fill #1

## 2023-12-06 ENCOUNTER — Other Ambulatory Visit (HOSPITAL_COMMUNITY): Payer: Self-pay

## 2024-01-08 ENCOUNTER — Other Ambulatory Visit: Payer: Self-pay

## 2024-01-08 ENCOUNTER — Other Ambulatory Visit (HOSPITAL_COMMUNITY): Payer: Self-pay

## 2024-01-08 DIAGNOSIS — H401131 Primary open-angle glaucoma, bilateral, mild stage: Secondary | ICD-10-CM | POA: Diagnosis not present

## 2024-01-08 DIAGNOSIS — H2513 Age-related nuclear cataract, bilateral: Secondary | ICD-10-CM | POA: Diagnosis not present

## 2024-01-08 MED ORDER — SIMBRINZA 1-0.2 % OP SUSP
1.0000 [drp] | Freq: Two times a day (BID) | OPHTHALMIC | 12 refills | Status: AC
  Filled 2024-01-08: qty 16, 80d supply, fill #0

## 2024-01-08 MED ORDER — TRAVOPROST (BAK FREE) 0.004 % OP SOLN
1.0000 [drp] | Freq: Every evening | OPHTHALMIC | 12 refills | Status: AC
  Filled 2024-01-08: qty 7.5, 90d supply, fill #0

## 2024-01-09 ENCOUNTER — Other Ambulatory Visit (HOSPITAL_COMMUNITY): Payer: Self-pay

## 2024-01-12 ENCOUNTER — Other Ambulatory Visit (HOSPITAL_COMMUNITY): Payer: Self-pay

## 2024-02-12 ENCOUNTER — Other Ambulatory Visit: Payer: Self-pay | Admitting: Family Medicine

## 2024-02-12 DIAGNOSIS — Z1231 Encounter for screening mammogram for malignant neoplasm of breast: Secondary | ICD-10-CM

## 2024-02-20 ENCOUNTER — Ambulatory Visit
Admission: RE | Admit: 2024-02-20 | Discharge: 2024-02-20 | Disposition: A | Discharge status: Home / Self Care | Payer: Commercial Managed Care - PPO | Source: Ambulatory Visit | Attending: Family Medicine | Admitting: Family Medicine

## 2024-02-20 DIAGNOSIS — Z1231 Encounter for screening mammogram for malignant neoplasm of breast: Secondary | ICD-10-CM | POA: Diagnosis not present

## 2024-03-01 ENCOUNTER — Other Ambulatory Visit (HOSPITAL_COMMUNITY): Payer: Self-pay

## 2024-03-01 ENCOUNTER — Other Ambulatory Visit: Payer: Self-pay

## 2024-03-04 ENCOUNTER — Other Ambulatory Visit (HOSPITAL_COMMUNITY): Payer: Self-pay

## 2024-06-03 ENCOUNTER — Other Ambulatory Visit (HOSPITAL_COMMUNITY): Payer: Self-pay

## 2024-06-03 DIAGNOSIS — R232 Flushing: Secondary | ICD-10-CM | POA: Diagnosis not present

## 2024-06-03 DIAGNOSIS — J302 Other seasonal allergic rhinitis: Secondary | ICD-10-CM | POA: Diagnosis not present

## 2024-06-03 DIAGNOSIS — R7303 Prediabetes: Secondary | ICD-10-CM | POA: Diagnosis not present

## 2024-06-03 DIAGNOSIS — L932 Other local lupus erythematosus: Secondary | ICD-10-CM | POA: Diagnosis not present

## 2024-06-03 DIAGNOSIS — K219 Gastro-esophageal reflux disease without esophagitis: Secondary | ICD-10-CM | POA: Diagnosis not present

## 2024-06-03 DIAGNOSIS — E559 Vitamin D deficiency, unspecified: Secondary | ICD-10-CM | POA: Diagnosis not present

## 2024-06-03 DIAGNOSIS — M1812 Unilateral primary osteoarthritis of first carpometacarpal joint, left hand: Secondary | ICD-10-CM | POA: Diagnosis not present

## 2024-06-03 DIAGNOSIS — E782 Mixed hyperlipidemia: Secondary | ICD-10-CM | POA: Diagnosis not present

## 2024-06-03 DIAGNOSIS — I10 Essential (primary) hypertension: Secondary | ICD-10-CM | POA: Diagnosis not present

## 2024-06-03 MED ORDER — VITAMIN D (ERGOCALCIFEROL) 50000 UNITS PO CAPS
1.0000 | ORAL_CAPSULE | ORAL | 1 refills | Status: AC
  Filled 2024-06-03: qty 13, 90d supply, fill #0

## 2024-06-03 MED ORDER — OMEPRAZOLE 40 MG PO CPDR
40.0000 mg | DELAYED_RELEASE_CAPSULE | Freq: Every day | ORAL | 1 refills | Status: AC
  Filled 2024-06-03: qty 90, 90d supply, fill #0

## 2024-06-03 MED ORDER — AMLODIPINE BESYLATE 2.5 MG PO TABS
2.5000 mg | ORAL_TABLET | Freq: Every day | ORAL | 1 refills | Status: AC
  Filled 2024-06-03 – 2024-08-01 (×2): qty 90, 90d supply, fill #0
  Filled 2024-11-18: qty 90, 90d supply, fill #1

## 2024-06-03 MED ORDER — SPIRONOLACTONE 25 MG PO TABS
25.0000 mg | ORAL_TABLET | Freq: Every day | ORAL | 1 refills | Status: AC
  Filled 2024-06-03 – 2024-09-04 (×2): qty 90, 90d supply, fill #0
  Filled 2025-01-02: qty 90, 90d supply, fill #1

## 2024-06-03 MED ORDER — SIMVASTATIN 20 MG PO TABS
20.0000 mg | ORAL_TABLET | Freq: Every day | ORAL | 1 refills | Status: AC
  Filled 2024-06-03 – 2024-09-23 (×2): qty 90, 90d supply, fill #0

## 2024-06-03 MED ORDER — TELMISARTAN 20 MG PO TABS
20.0000 mg | ORAL_TABLET | Freq: Every day | ORAL | 1 refills | Status: DC
  Filled 2024-06-03: qty 90, 90d supply, fill #0
  Filled 2024-09-23: qty 90, 90d supply, fill #1

## 2024-08-01 ENCOUNTER — Other Ambulatory Visit: Payer: Self-pay

## 2024-08-01 ENCOUNTER — Other Ambulatory Visit (HOSPITAL_COMMUNITY): Payer: Self-pay

## 2024-09-04 ENCOUNTER — Other Ambulatory Visit (HOSPITAL_COMMUNITY): Payer: Self-pay

## 2024-09-23 ENCOUNTER — Other Ambulatory Visit (HOSPITAL_COMMUNITY): Payer: Self-pay

## 2024-09-23 ENCOUNTER — Other Ambulatory Visit: Payer: Self-pay

## 2024-09-27 ENCOUNTER — Other Ambulatory Visit (HOSPITAL_COMMUNITY): Payer: Self-pay

## 2024-09-27 ENCOUNTER — Other Ambulatory Visit (HOSPITAL_BASED_OUTPATIENT_CLINIC_OR_DEPARTMENT_OTHER): Payer: Self-pay

## 2024-09-27 DIAGNOSIS — M546 Pain in thoracic spine: Secondary | ICD-10-CM | POA: Diagnosis not present

## 2024-09-27 MED ORDER — METHOCARBAMOL 750 MG PO TABS: 750.0000 mg | ORAL_TABLET | Freq: Three times a day (TID) | ORAL | 0 refills | Status: AC

## 2024-09-27 MED ORDER — METHOCARBAMOL 750 MG PO TABS
750.0000 mg | ORAL_TABLET | Freq: Three times a day (TID) | ORAL | 0 refills | Status: AC
  Filled 2024-09-27: qty 15, 5d supply, fill #0

## 2024-09-27 MED ORDER — IBUPROFEN 600 MG PO TABS: 600.0000 mg | ORAL_TABLET | Freq: Three times a day (TID) | ORAL | 0 refills | Status: AC | PRN

## 2024-09-27 MED ORDER — IBUPROFEN 600 MG PO TABS
600.0000 mg | ORAL_TABLET | Freq: Three times a day (TID) | ORAL | 0 refills | Status: AC
  Filled 2024-09-27: qty 15, 5d supply, fill #0

## 2024-10-04 ENCOUNTER — Other Ambulatory Visit (HOSPITAL_COMMUNITY): Payer: Self-pay

## 2024-10-04 DIAGNOSIS — I10 Essential (primary) hypertension: Secondary | ICD-10-CM | POA: Diagnosis not present

## 2024-10-04 DIAGNOSIS — M545 Low back pain, unspecified: Secondary | ICD-10-CM | POA: Diagnosis not present

## 2024-10-04 MED ORDER — PREDNISONE 20 MG PO TABS
20.0000 mg | ORAL_TABLET | ORAL | 0 refills | Status: AC
  Filled 2024-10-04: qty 18, 9d supply, fill #0

## 2024-10-04 MED ORDER — TIZANIDINE HCL 2 MG PO CAPS
2.0000 mg | ORAL_CAPSULE | Freq: Every evening | ORAL | 0 refills | Status: AC
  Filled 2024-10-04: qty 10, 10d supply, fill #0

## 2025-01-02 ENCOUNTER — Other Ambulatory Visit (HOSPITAL_COMMUNITY): Payer: Self-pay

## 2025-01-03 ENCOUNTER — Other Ambulatory Visit (HOSPITAL_COMMUNITY): Payer: Self-pay

## 2025-01-03 MED ORDER — TELMISARTAN 20 MG PO TABS
20.0000 mg | ORAL_TABLET | Freq: Every day | ORAL | 0 refills | Status: AC
  Filled 2025-01-03: qty 90, 90d supply, fill #0

## 2025-01-06 ENCOUNTER — Other Ambulatory Visit (HOSPITAL_COMMUNITY): Payer: Self-pay
